# Patient Record
Sex: Male | Born: 1945
Health system: Southern US, Community
[De-identification: ages and names within clinical notes are randomized; demographics above are authoritative.]

## PROBLEM LIST (undated history)

## (undated) ENCOUNTER — Ambulatory Visit

## (undated) DIAGNOSIS — M1990 Unspecified osteoarthritis, unspecified site: Secondary | ICD-10-CM

## (undated) DIAGNOSIS — I10 Essential (primary) hypertension: Secondary | ICD-10-CM

## (undated) DIAGNOSIS — K219 Gastro-esophageal reflux disease without esophagitis: Secondary | ICD-10-CM

## (undated) DIAGNOSIS — C189 Malignant neoplasm of colon, unspecified: Secondary | ICD-10-CM

## (undated) DIAGNOSIS — E785 Hyperlipidemia, unspecified: Secondary | ICD-10-CM

## (undated) DIAGNOSIS — N189 Chronic kidney disease, unspecified: Secondary | ICD-10-CM

## (undated) HISTORY — DX: Gastro-esophageal reflux disease without esophagitis: K21.9

## (undated) HISTORY — PX: COLONOSCOPY: SHX5424

## (undated) HISTORY — DX: Hyperlipidemia, unspecified: E78.5

## (undated) HISTORY — DX: Chronic kidney disease, unspecified: N18.9

## (undated) HISTORY — DX: Malignant neoplasm of colon, unspecified: C18.9

## (undated) HISTORY — DX: Essential (primary) hypertension: I10

---

## 2009-12-27 ENCOUNTER — Inpatient Hospital Stay: Payer: Self-pay | Admitting: Internal Medicine

## 2010-02-04 ENCOUNTER — Other Ambulatory Visit: Payer: Self-pay | Admitting: Internal Medicine

## 2010-03-03 HISTORY — PX: CARDIAC SURGERY: SHX584

## 2010-03-21 ENCOUNTER — Other Ambulatory Visit: Payer: Self-pay | Admitting: Internal Medicine

## 2010-06-19 ENCOUNTER — Emergency Department: Payer: Self-pay | Admitting: Emergency Medicine

## 2010-06-27 ENCOUNTER — Ambulatory Visit: Payer: Self-pay | Admitting: Internal Medicine

## 2010-10-28 ENCOUNTER — Other Ambulatory Visit: Payer: Self-pay | Admitting: Internal Medicine

## 2010-11-02 ENCOUNTER — Ambulatory Visit: Payer: Self-pay | Admitting: Internal Medicine

## 2010-12-12 ENCOUNTER — Ambulatory Visit: Payer: Self-pay | Admitting: Internal Medicine

## 2011-02-16 ENCOUNTER — Ambulatory Visit: Payer: Self-pay | Admitting: Internal Medicine

## 2011-07-03 ENCOUNTER — Ambulatory Visit: Payer: Self-pay

## 2011-07-03 LAB — CBC WITH DIFFERENTIAL/PLATELET
Basophil #: 0 10*3/uL (ref 0.0–0.1)
Basophil %: 0.8 %
Eosinophil #: 0 10*3/uL (ref 0.0–0.7)
Eosinophil %: 0.4 %
HCT: 46 % (ref 40.0–52.0)
HGB: 15.1 g/dL (ref 13.0–18.0)
Lymphocyte #: 0.5 10*3/uL — ABNORMAL LOW (ref 1.0–3.6)
Lymphocyte %: 11.6 %
MCH: 29.7 pg (ref 26.0–34.0)
MCHC: 32.8 g/dL (ref 32.0–36.0)
MCV: 91 fL (ref 80–100)
Monocyte #: 0.3 10*3/uL (ref 0.0–0.7)
Monocyte %: 6.7 %
Neutrophil #: 3.5 10*3/uL (ref 1.4–6.5)
Neutrophil %: 80.5 %
Platelet: 222 10*3/uL (ref 150–440)
RBC: 5.08 10*6/uL (ref 4.40–5.90)
RDW: 13.1 % (ref 11.5–14.5)
WBC: 4.3 10*3/uL (ref 3.8–10.6)

## 2011-07-03 LAB — COMPREHENSIVE METABOLIC PANEL
Albumin: 3.6 g/dL (ref 3.4–5.0)
Alkaline Phosphatase: 122 U/L (ref 50–136)
Anion Gap: 9 (ref 7–16)
BUN: 25 mg/dL — ABNORMAL HIGH (ref 7–18)
Bilirubin,Total: 0.3 mg/dL (ref 0.2–1.0)
Calcium, Total: 9.2 mg/dL (ref 8.5–10.1)
Chloride: 102 mmol/L (ref 98–107)
Co2: 25 mmol/L (ref 21–32)
Creatinine: 1.79 mg/dL — ABNORMAL HIGH (ref 0.60–1.30)
EGFR (African American): 49 — ABNORMAL LOW
EGFR (Non-African Amer.): 41 — ABNORMAL LOW
Glucose: 104 mg/dL — ABNORMAL HIGH (ref 65–99)
Osmolality: 277 (ref 275–301)
Potassium: 4.6 mmol/L (ref 3.5–5.1)
SGOT(AST): 30 U/L (ref 15–37)
SGPT (ALT): 38 U/L
Sodium: 136 mmol/L (ref 136–145)
Total Protein: 7.4 g/dL (ref 6.4–8.2)

## 2011-07-03 LAB — LIPASE, BLOOD: Lipase: 214 U/L (ref 73–393)

## 2011-08-29 DIAGNOSIS — R7301 Impaired fasting glucose: Secondary | ICD-10-CM | POA: Insufficient documentation

## 2011-11-24 ENCOUNTER — Other Ambulatory Visit: Payer: Self-pay | Admitting: Internal Medicine

## 2011-11-25 LAB — WBCS, STOOL

## 2011-11-25 LAB — CLOSTRIDIUM DIFFICILE BY PCR

## 2011-12-06 IMAGING — US US EXTREM LOW VENOUS BILAT
1 series · 18 of 24 positions shown · non-contrast
Comparison: none

REASON FOR EXAM: elevated d-dimer, ?DVT
COMMENTS:

PROCEDURE:     US  - US DOPPLER LOW EXTR BILATERAL  - December 28, 2009  [DATE]
RESULT:     History: Elevated d-dimer.

[Series 1: us extrem low venous bilat · 18 of 43 slices shown]
[im 1/43]
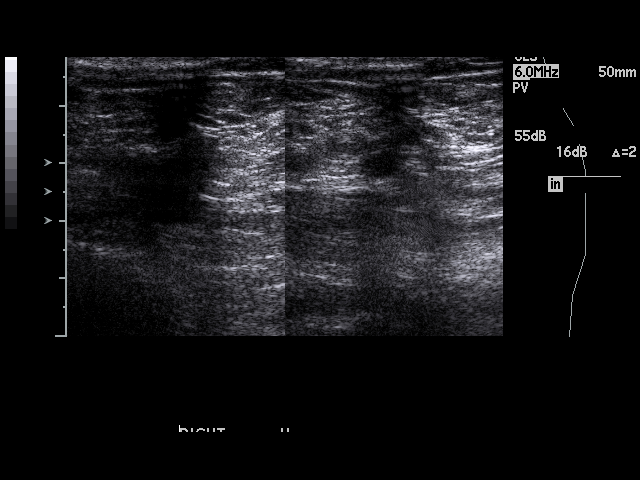
[im 4/43]
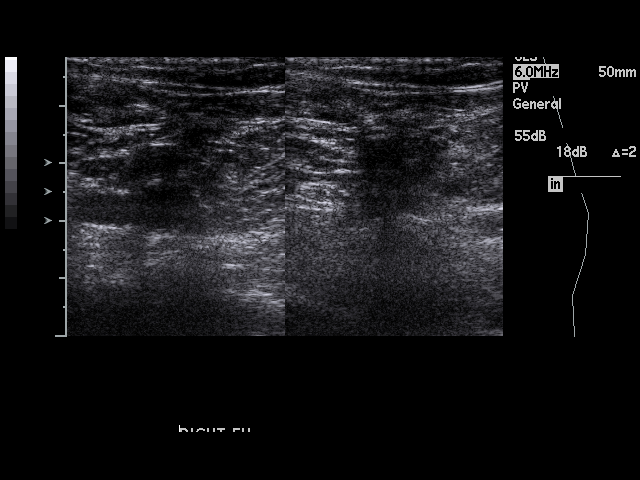
[im 6/43]
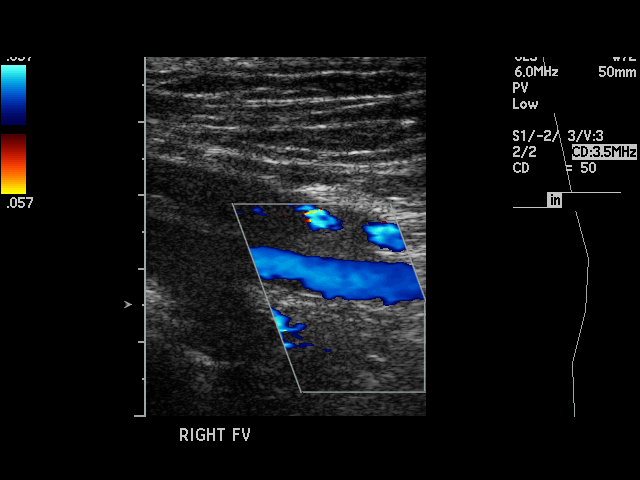
[im 8/43]
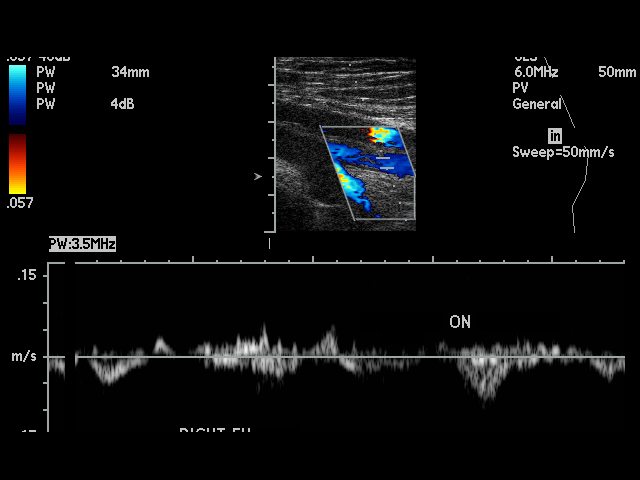
[im 11/43]
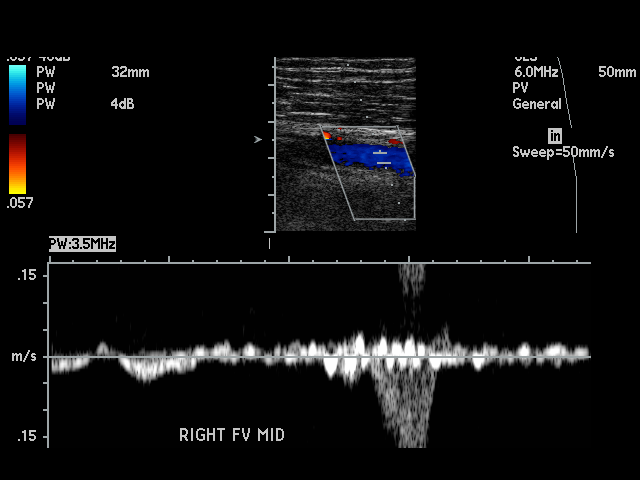
[im 13/43]
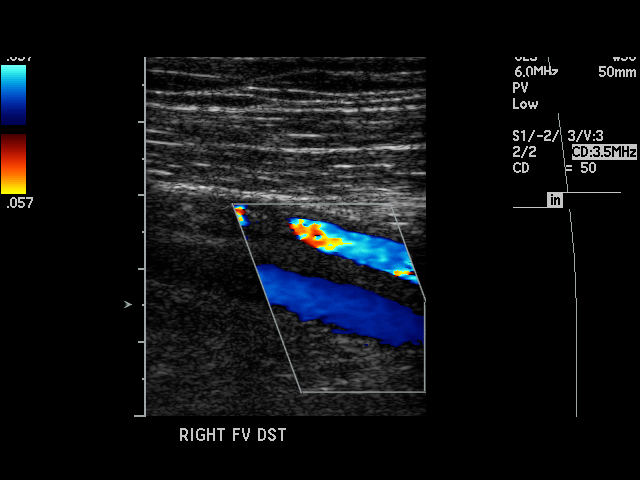
[im 15/43]
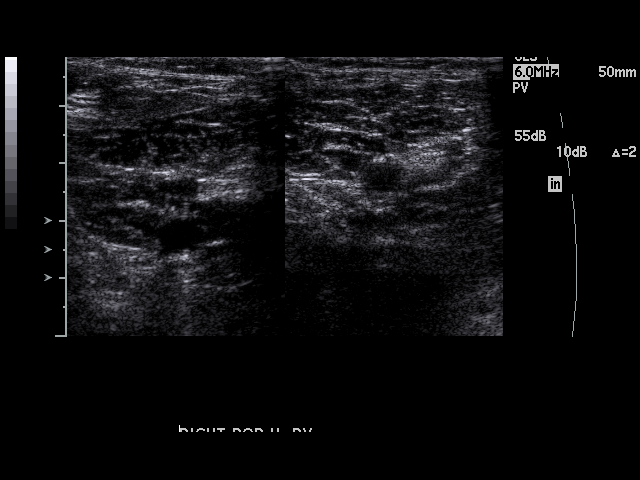
[im 19/43]
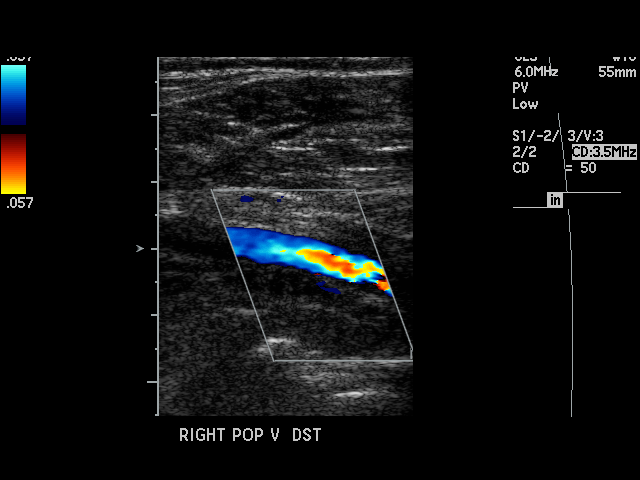
[im 21/43]
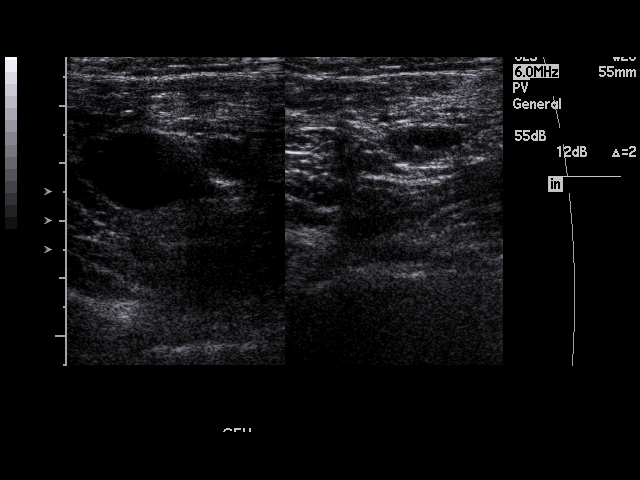
[im 22/43]
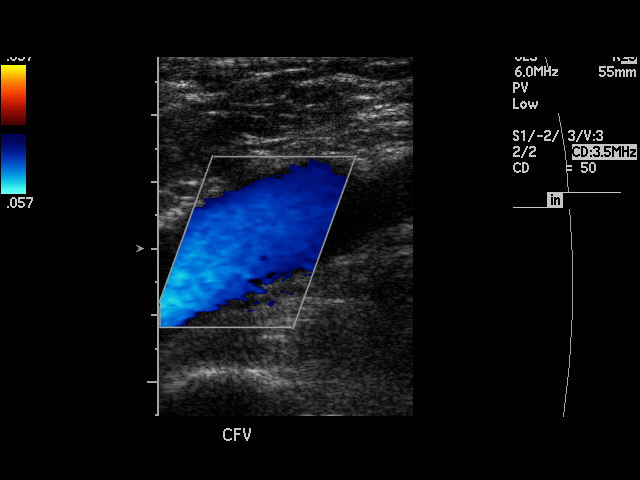
[im 26/43]
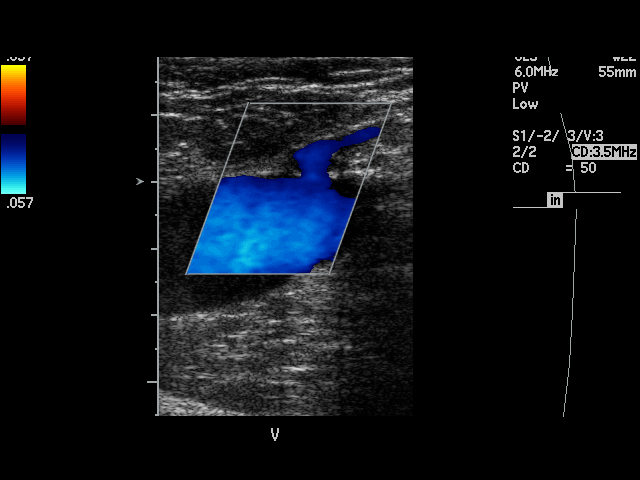
[im 28/43]
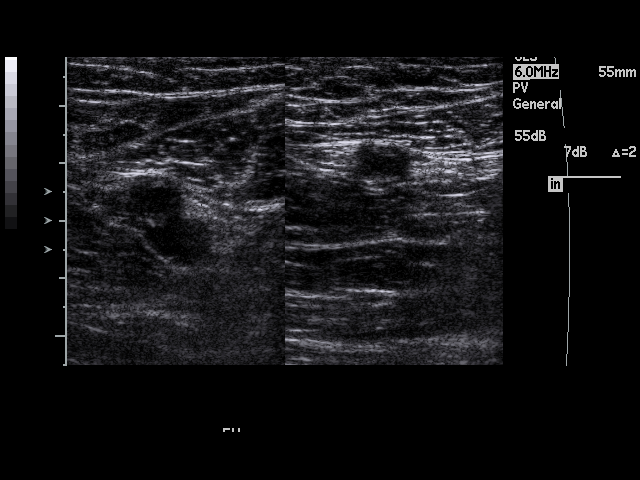
[im 30/43]
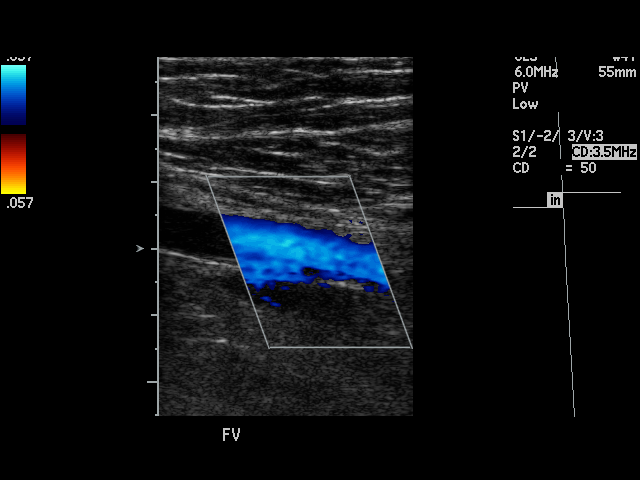
[im 33/43]
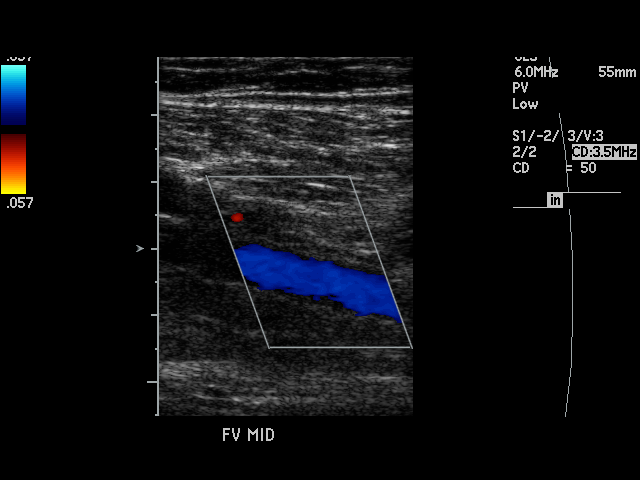
[im 35/43]
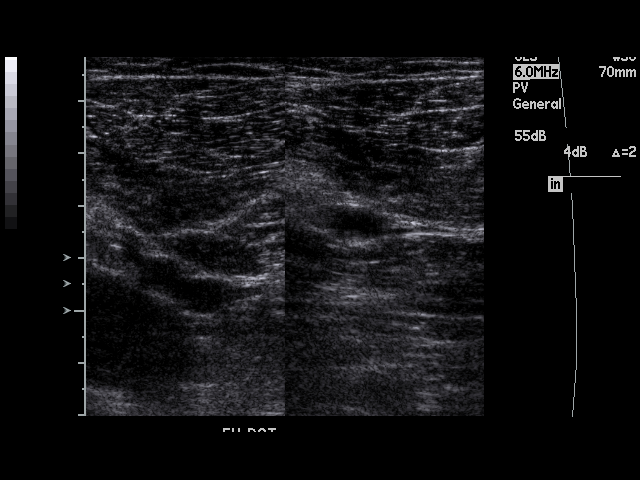
[im 37/43]
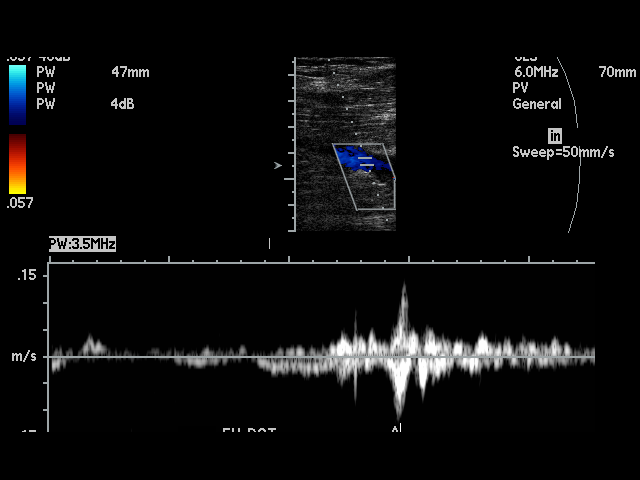
[im 41/43]
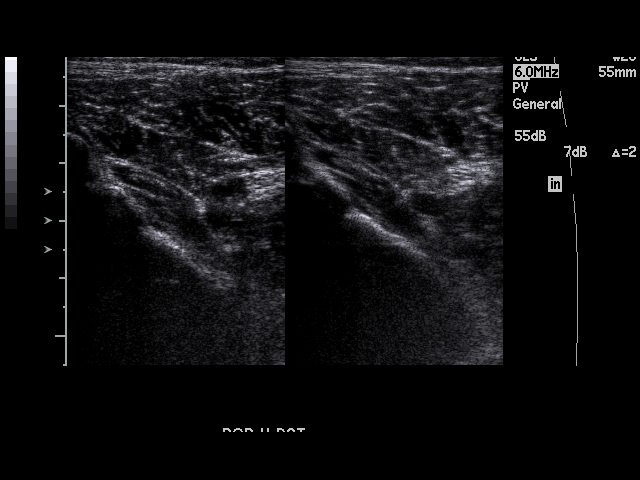
[im 43/43]
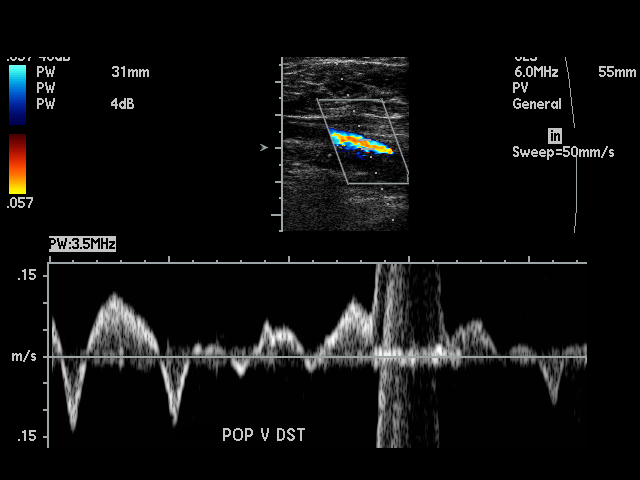

[18 of 24 positions shown; findings below may reference images not displayed]

FINDINGS: Bilateral lower extremity color flow  Duplex Doppler performed. No
deep venous thrombus noted. The right common femoral vein could not be
studied due to central line in this region.
IMPRESSION: Negative exam.

## 2011-12-08 IMAGING — CR DG ABDOMEN 1V
1 series · 2 of 2 positions shown · non-contrast
Comparison: none

REASON FOR EXAM: distended belly
COMMENTS:   Bedside (portable):Y

[Series 1: view not recorded · 0.17mm/px · 2 of 2 slices shown]
[im 1/2]
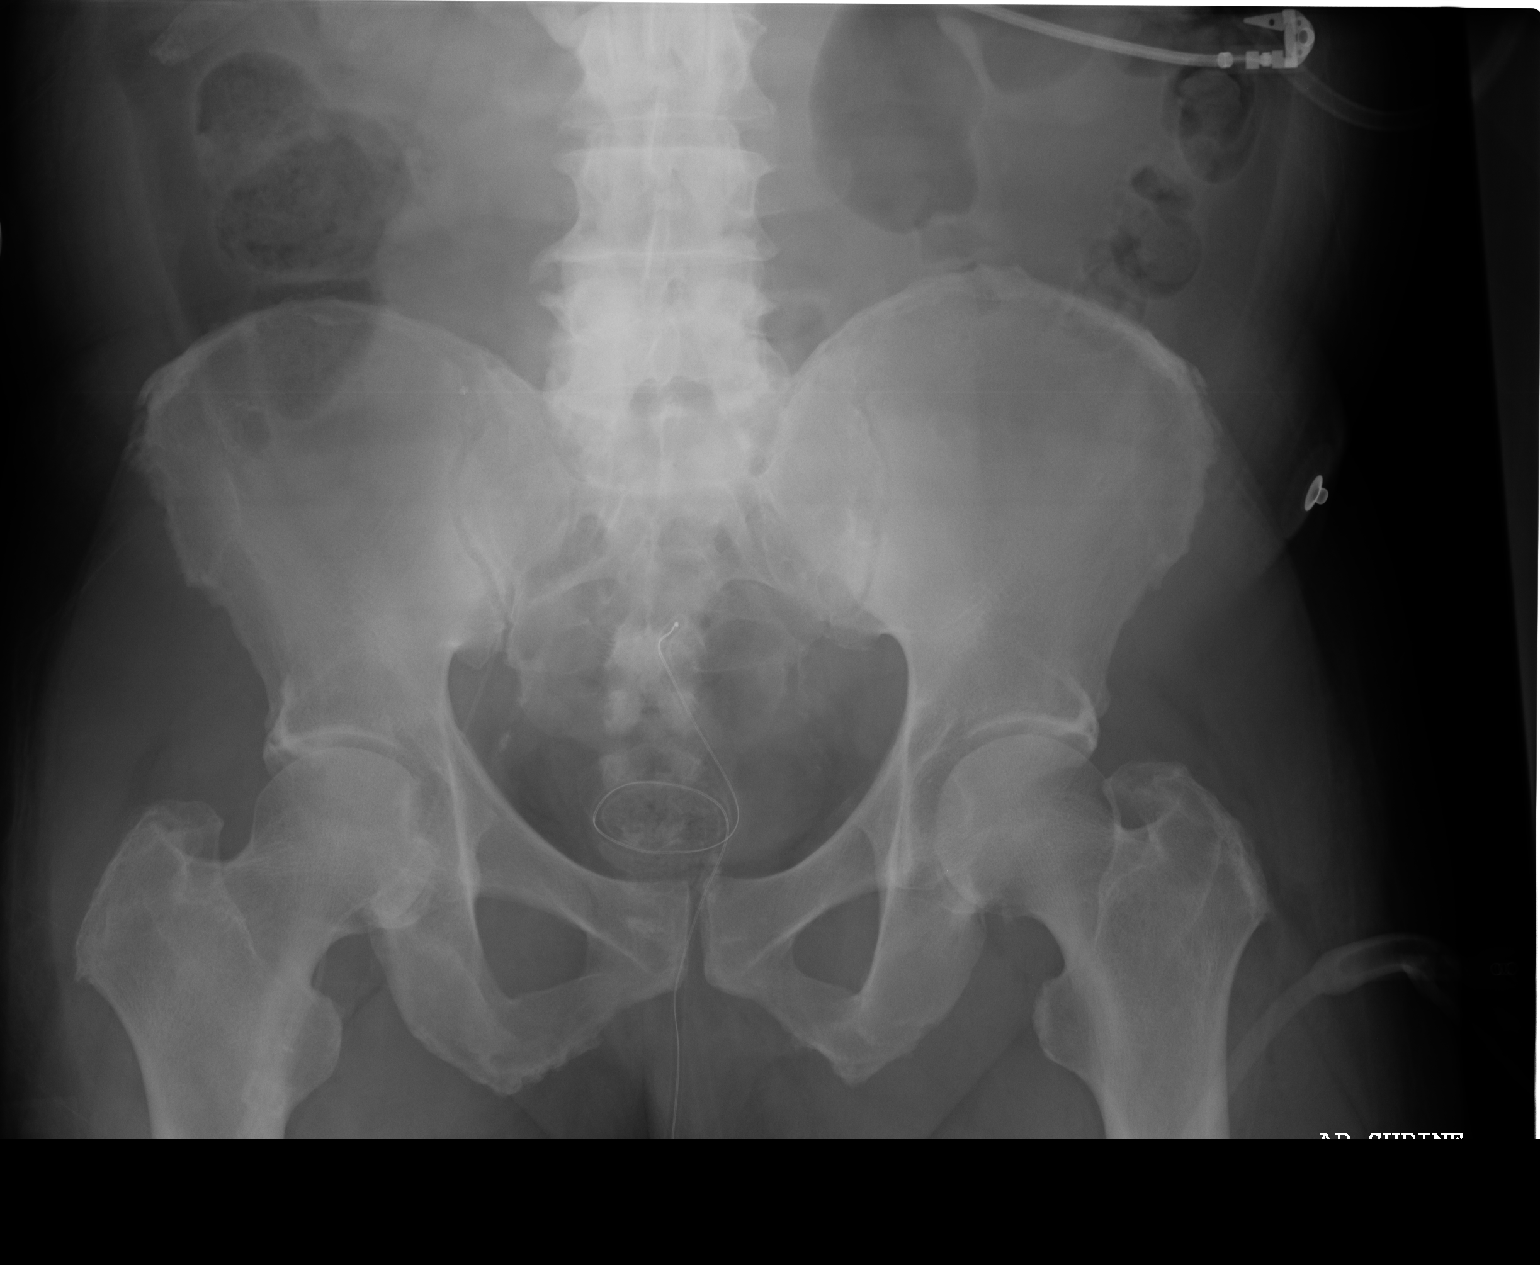
[im 2/2]
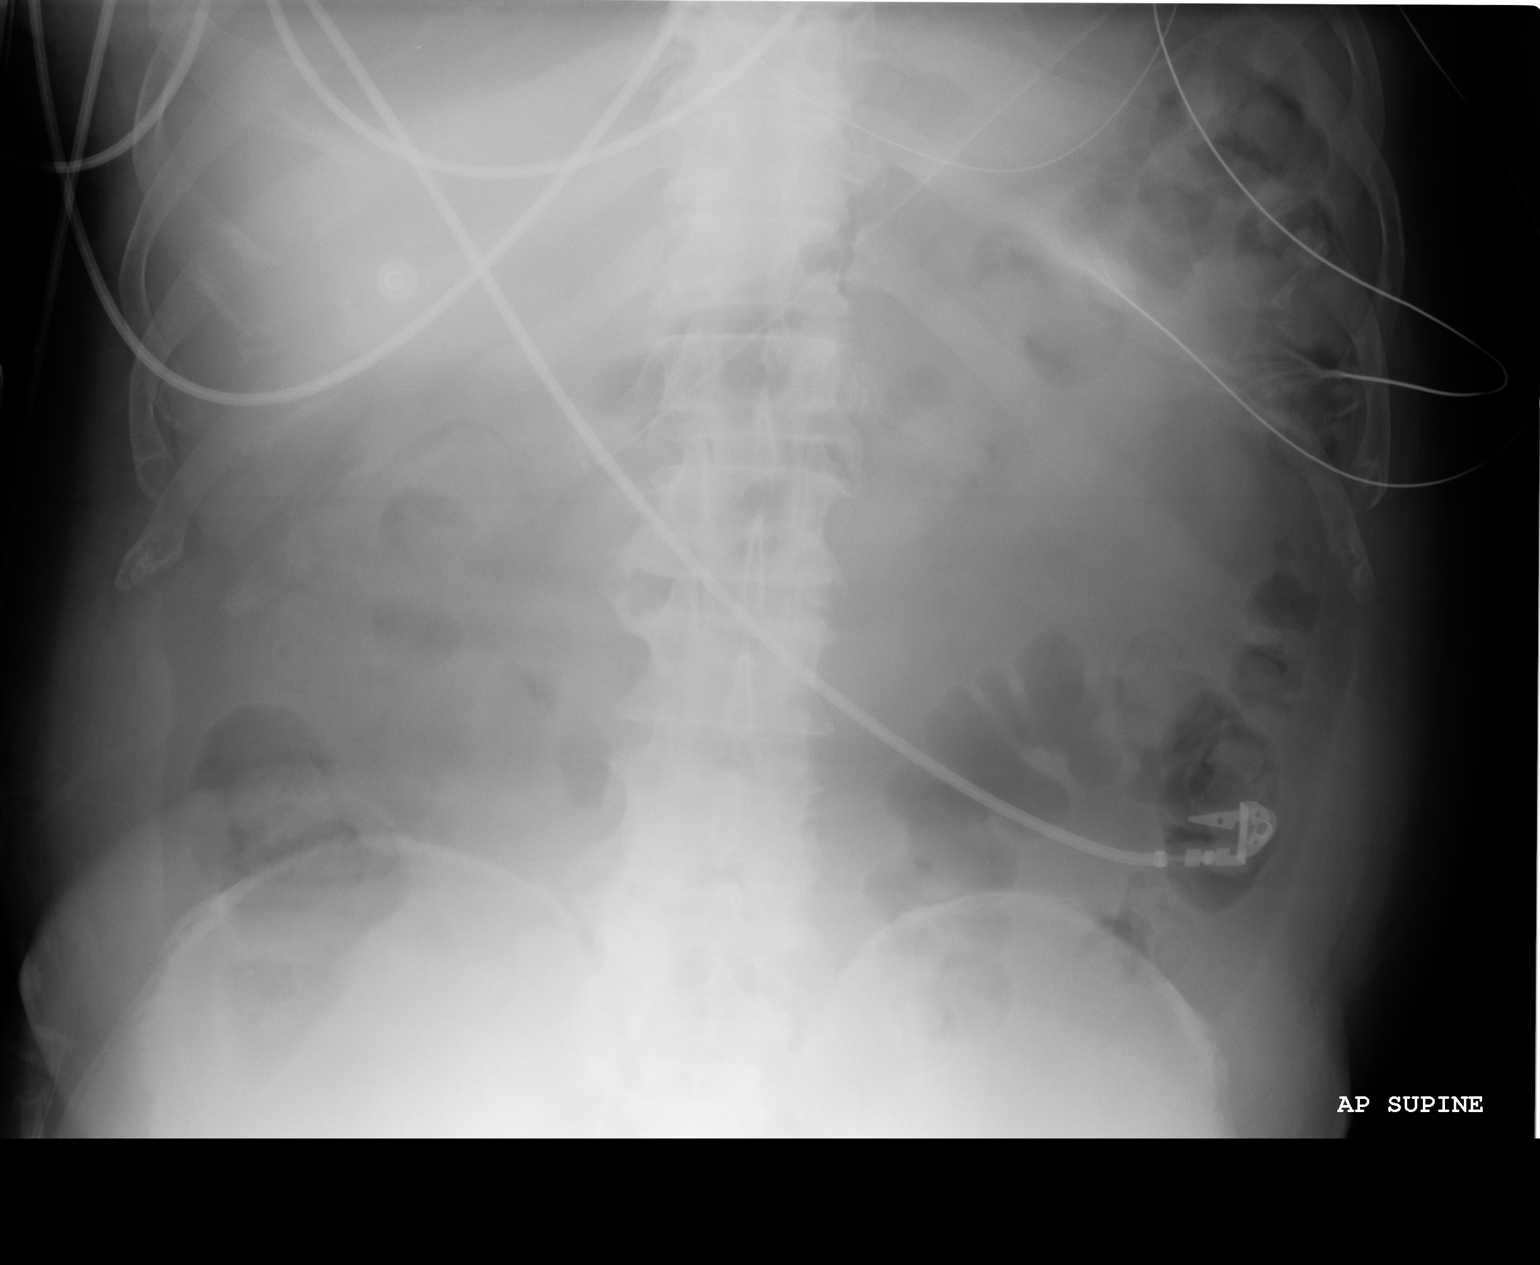

[2 of 2 positions shown; findings below may reference images not displayed]

PROCEDURE:     DXR - DXR KIDNEY URETER BLADDER  - December 30, 2009 [DATE]

RESULT:     Portable AP supine view of the abdomen shows no evidence of
abnormal bowel distention. There appears to be an esophagogastric tube
present with the tip in the region of the pylorus. Cardiac monitoring
electrodes are present. Curvilinear density projects over the region of the
urinary bladder and could represent a Foley catheter. Degenerative changes
are seen in the skeletal system.
IMPRESSION: No abnormal bowel distention evident.

## 2011-12-08 IMAGING — CT CT HEAD WITHOUT CONTRAST
1 series · 16 of 30 positions shown, 20 images · non-contrast
Comparison: none

REASON FOR EXAM: seizure and posturing
COMMENTS:

PROCEDURE:     CT  - CT HEAD WITHOUT CONTRAST  - December 30, 2009  [DATE]
RESULT:     Head CT dated 12/30/2009
TECHNIQUE: Helical 5 mm sections were obtained from the skull base to the
vertex without administration of intravenous contrast. The study was
compared to previous study dated 12/27/2009.

[Series 2: soft tissue · axial · 0.45mm/px · z∈[-169,-34]mm · 16 of 30 slices shown, 20 images]
[im 2/30  brain]
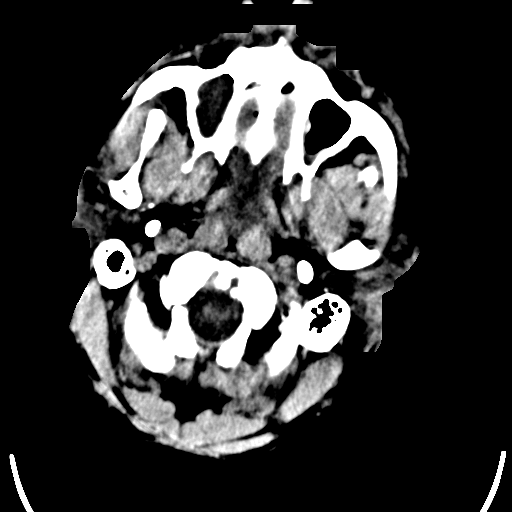
[im 2/30  bone]
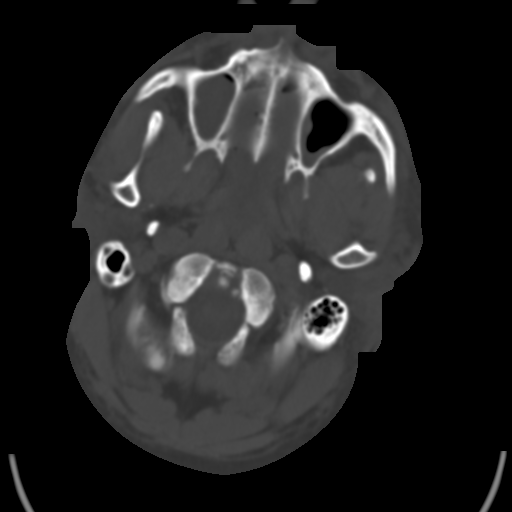
[im 4/30  brain]
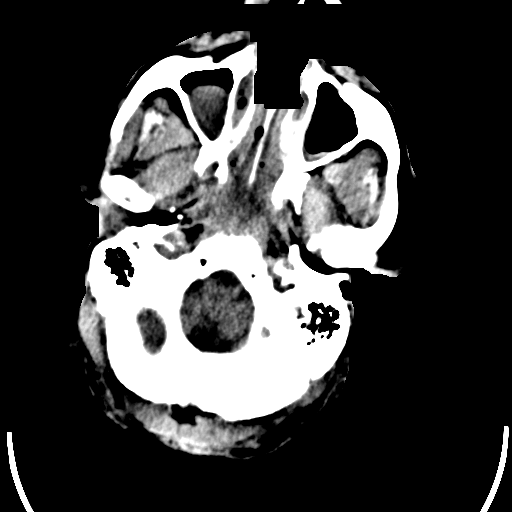
[im 6/30  brain]
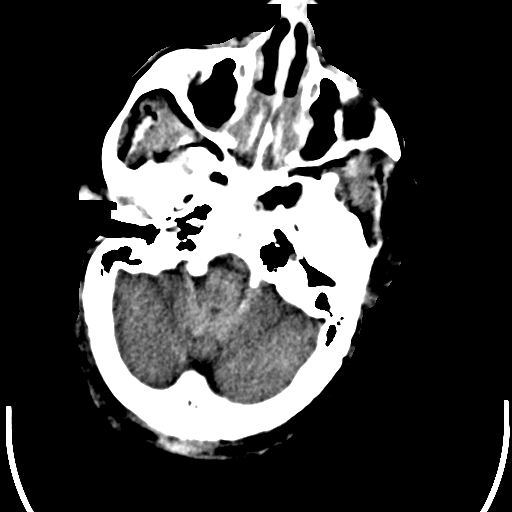
[im 8/30  brain]
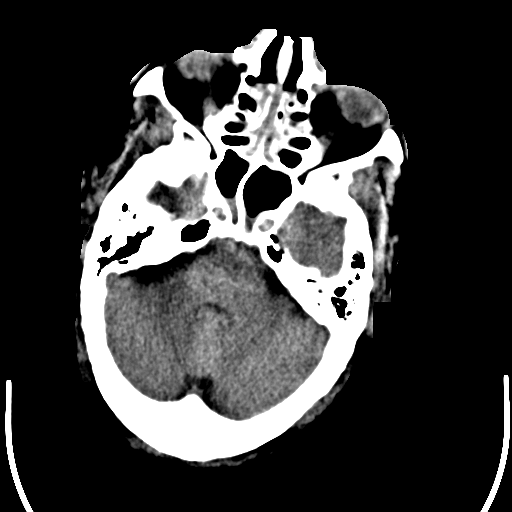
[im 9/30  brain]
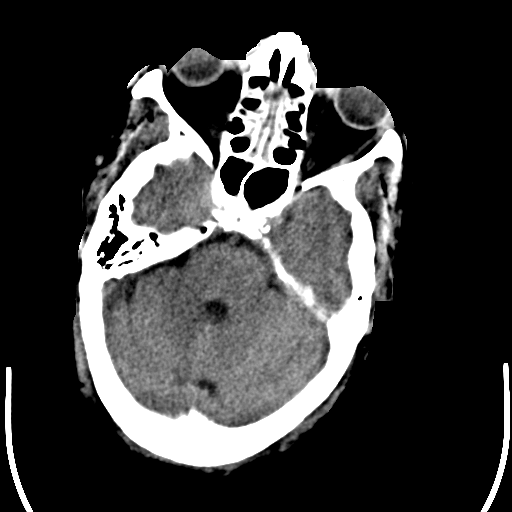
[im 9/30  bone]
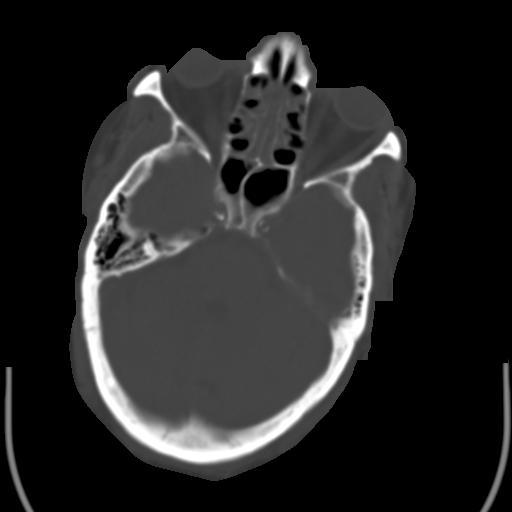
[im 11/30  brain]
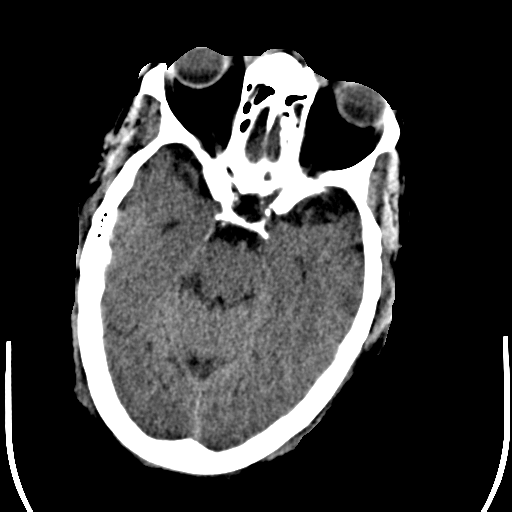
[im 13/30  brain]
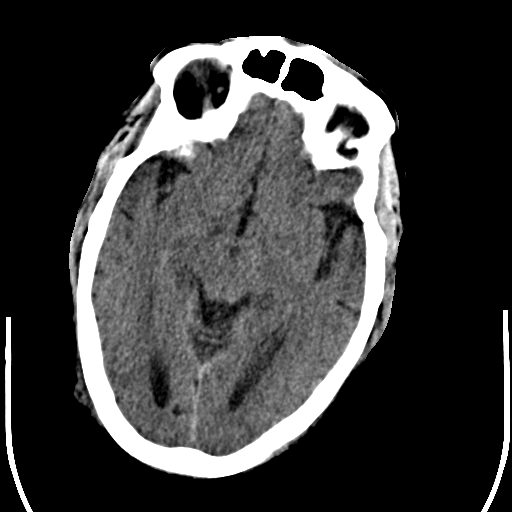
[im 15/30  brain]
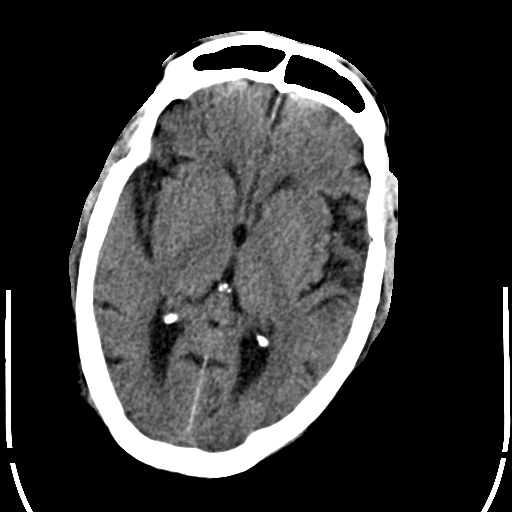
[im 16/30  brain]
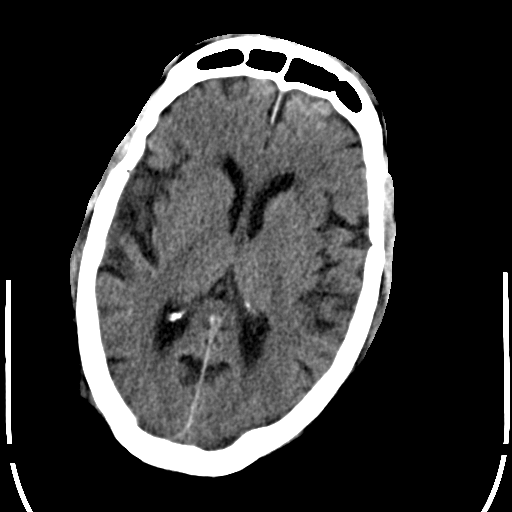
[im 16/30  bone]
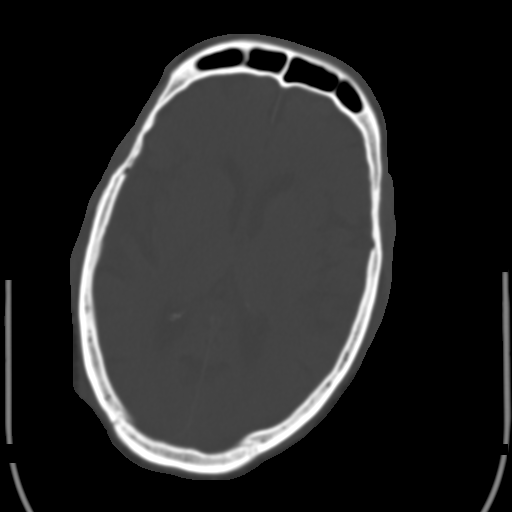
[im 18/30  brain]
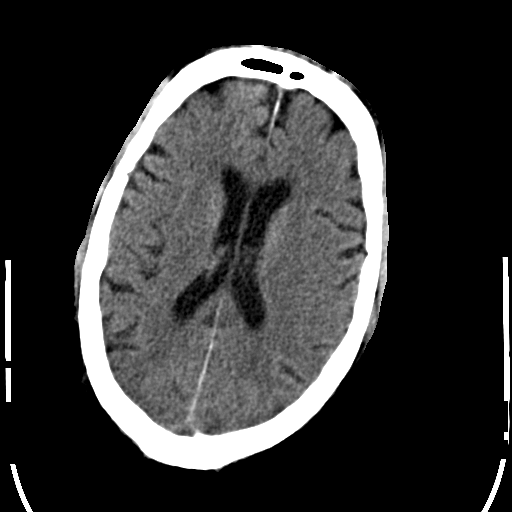
[im 20/30  brain]
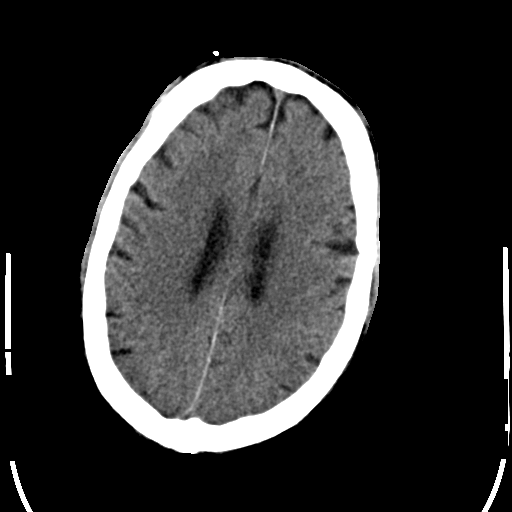
[im 22/30  brain]
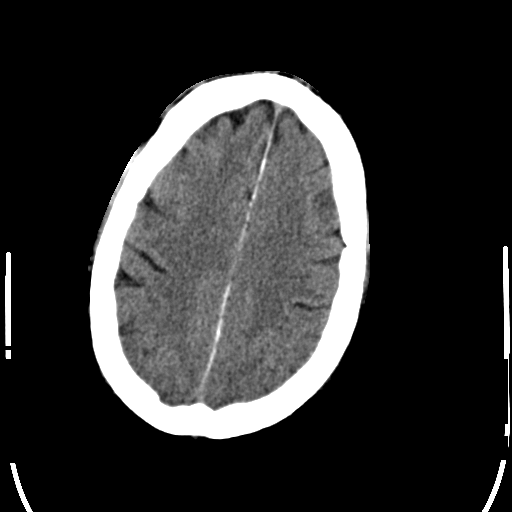
[im 23/30  brain]
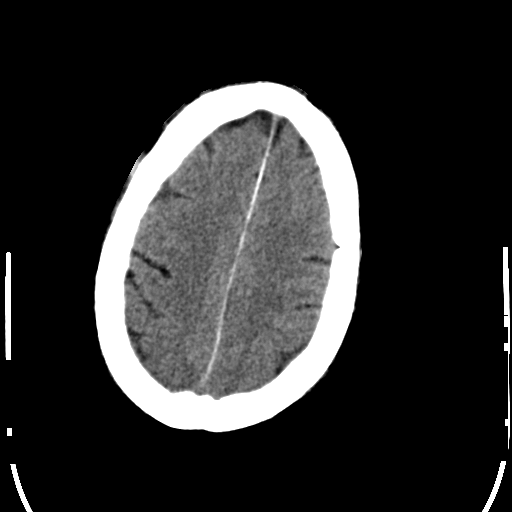
[im 23/30  bone]
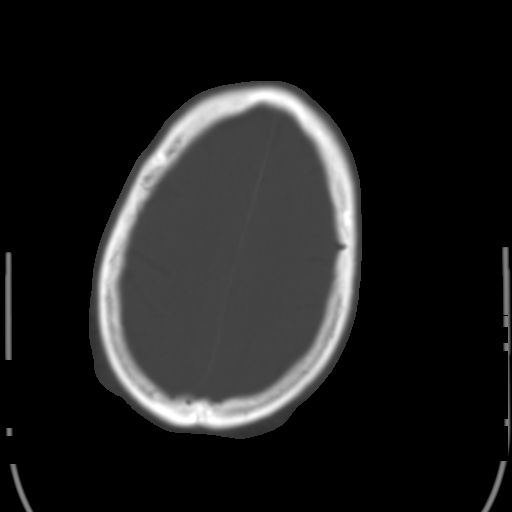
[im 25/30  brain]
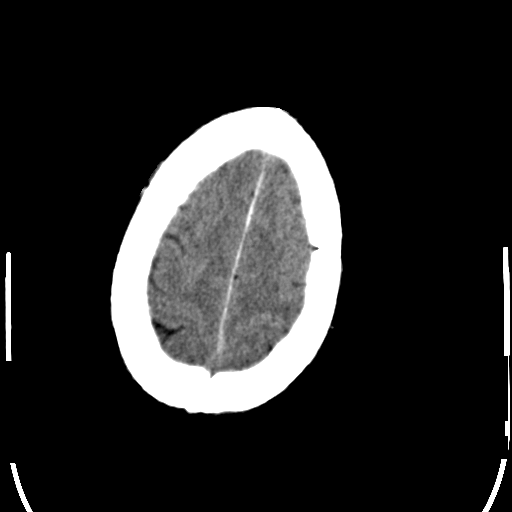
[im 27/30  brain]
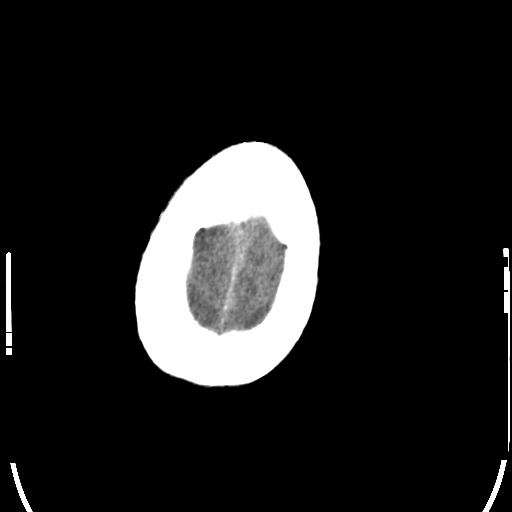
[im 29/30  brain]
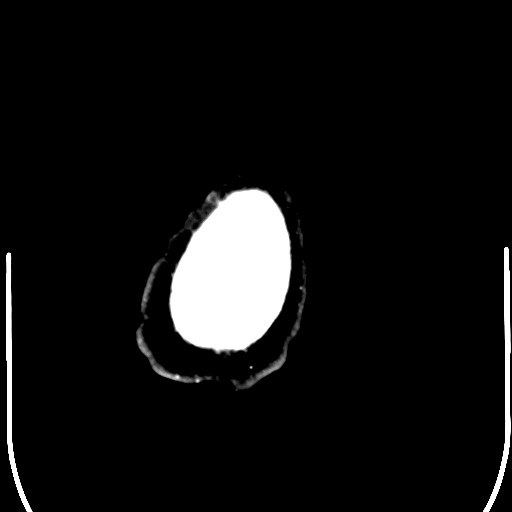

[16 of 30 positions shown; findings below may reference images not displayed]

FINDINGS: There is no evidence of intra-axial nor extra-axial fluid
collections. Is no evidence of acute hemorrhage. Mild diffuse cortical
atrophy is appreciated. There is no evidence of subfalcine or tonsillar
herniation. The ventricles and cisterns are patent. There is evidence of
mild effacement of the sulci along the apices of the right and left frontal
lobes. This finding is symmetric and unchanged and is likely an incidental
finding. The pons and cerebellum are grossly unremarkable.
IMPRESSION: No evidence of focal or acute abnormalities. There does
appear to be no significant change in head CT with compared to prior study.

## 2011-12-21 DIAGNOSIS — Z86010 Personal history of colonic polyps: Secondary | ICD-10-CM | POA: Insufficient documentation

## 2012-03-28 ENCOUNTER — Ambulatory Visit: Payer: Self-pay | Admitting: Internal Medicine

## 2012-03-28 LAB — COMPREHENSIVE METABOLIC PANEL
Anion Gap: 7 (ref 7–16)
Calcium, Total: 9.3 mg/dL (ref 8.5–10.1)
Chloride: 107 mmol/L (ref 98–107)
Co2: 30 mmol/L (ref 21–32)
EGFR (African American): 55 — ABNORMAL LOW
Osmolality: 292 (ref 275–301)
SGOT(AST): 34 U/L (ref 15–37)
SGPT (ALT): 48 U/L (ref 12–78)
Total Protein: 7.5 g/dL (ref 6.4–8.2)

## 2012-03-28 LAB — CBC WITH DIFFERENTIAL/PLATELET
Basophil #: 0.1 10*3/uL (ref 0.0–0.1)
Basophil %: 1.4 %
Eosinophil #: 0.2 10*3/uL (ref 0.0–0.7)
HCT: 44.3 % (ref 40.0–52.0)
Lymphocyte #: 2.5 10*3/uL (ref 1.0–3.6)
Lymphocyte %: 45.3 %
MCH: 29.9 pg (ref 26.0–34.0)
MCHC: 32.9 g/dL (ref 32.0–36.0)
MCV: 91 fL (ref 80–100)
Monocyte %: 8.7 %
Neutrophil #: 2.2 10*3/uL (ref 1.4–6.5)
Neutrophil %: 40.8 %
RDW: 13.6 % (ref 11.5–14.5)

## 2012-03-28 LAB — LIPID PANEL
HDL Cholesterol: 42 mg/dL (ref 40–60)
Ldl Cholesterol, Calc: 80 mg/dL (ref 0–100)
Triglycerides: 96 mg/dL (ref 0–200)
VLDL Cholesterol, Calc: 19 mg/dL (ref 5–40)

## 2012-03-29 ENCOUNTER — Ambulatory Visit: Payer: Self-pay | Admitting: Internal Medicine

## 2012-03-29 LAB — BASIC METABOLIC PANEL
Anion Gap: 6 — ABNORMAL LOW (ref 7–16)
BUN: 25 mg/dL — ABNORMAL HIGH (ref 7–18)
Chloride: 106 mmol/L (ref 98–107)
Co2: 29 mmol/L (ref 21–32)
Creatinine: 1.56 mg/dL — ABNORMAL HIGH (ref 0.60–1.30)
EGFR (Non-African Amer.): 46 — ABNORMAL LOW
Glucose: 99 mg/dL (ref 65–99)
Osmolality: 286 (ref 275–301)
Potassium: 4.1 mmol/L (ref 3.5–5.1)

## 2012-03-29 LAB — PSA: PSA: 0.6 ng/mL (ref 0.0–4.0)

## 2012-04-10 ENCOUNTER — Ambulatory Visit: Payer: Self-pay | Admitting: Internal Medicine

## 2012-09-03 ENCOUNTER — Ambulatory Visit: Payer: Self-pay | Admitting: Internal Medicine

## 2012-09-06 ENCOUNTER — Ambulatory Visit: Payer: Self-pay | Admitting: Internal Medicine

## 2012-09-10 ENCOUNTER — Other Ambulatory Visit: Payer: Self-pay | Admitting: Internal Medicine

## 2012-09-10 LAB — CBC WITH DIFFERENTIAL/PLATELET
Basophil #: 0.1 10*3/uL (ref 0.0–0.1)
Eosinophil %: 1 %
HGB: 15.2 g/dL (ref 13.0–18.0)
Lymphocyte #: 1.6 10*3/uL (ref 1.0–3.6)
MCH: 30.7 pg (ref 26.0–34.0)
MCHC: 33.9 g/dL (ref 32.0–36.0)
Monocyte %: 5.3 %
Neutrophil #: 4.6 10*3/uL (ref 1.4–6.5)
RBC: 4.94 10*6/uL (ref 4.40–5.90)
RDW: 13.4 % (ref 11.5–14.5)
WBC: 6.7 10*3/uL (ref 3.8–10.6)

## 2012-09-10 LAB — BASIC METABOLIC PANEL
Chloride: 114 mmol/L — ABNORMAL HIGH (ref 98–107)
Glucose: 101 mg/dL — ABNORMAL HIGH (ref 65–99)
Osmolality: 289 (ref 275–301)
Potassium: 3.9 mmol/L (ref 3.5–5.1)
Sodium: 144 mmol/L (ref 136–145)

## 2012-09-10 LAB — SEDIMENTATION RATE: Erythrocyte Sed Rate: 2 mm/hr (ref 0–20)

## 2012-11-27 DIAGNOSIS — M4802 Spinal stenosis, cervical region: Secondary | ICD-10-CM | POA: Insufficient documentation

## 2012-12-11 HISTORY — PX: CERVICAL FUSION: SHX112

## 2013-01-03 ENCOUNTER — Ambulatory Visit: Payer: Self-pay | Admitting: Family Medicine

## 2013-01-03 LAB — BASIC METABOLIC PANEL
BUN: 19 mg/dL — ABNORMAL HIGH (ref 7–18)
Calcium, Total: 9.1 mg/dL (ref 8.5–10.1)
Co2: 26 mmol/L (ref 21–32)
EGFR (Non-African Amer.): 51 — ABNORMAL LOW
Glucose: 100 mg/dL — ABNORMAL HIGH (ref 65–99)
Osmolality: 280 (ref 275–301)
Potassium: 4 mmol/L (ref 3.5–5.1)
Sodium: 139 mmol/L (ref 136–145)

## 2013-01-03 LAB — CBC WITH DIFFERENTIAL/PLATELET
Basophil #: 0.1 10*3/uL (ref 0.0–0.1)
Eosinophil #: 0.2 10*3/uL (ref 0.0–0.7)
Eosinophil %: 3.3 %
HCT: 44.1 % (ref 40.0–52.0)
HGB: 14.4 g/dL (ref 13.0–18.0)
Lymphocyte #: 2 10*3/uL (ref 1.0–3.6)
Lymphocyte %: 40.8 %
MCH: 29.4 pg (ref 26.0–34.0)
MCV: 90 fL (ref 80–100)
RBC: 4.9 10*6/uL (ref 4.40–5.90)
RDW: 13.2 % (ref 11.5–14.5)

## 2013-02-20 ENCOUNTER — Ambulatory Visit: Payer: Self-pay | Admitting: Gastroenterology

## 2014-01-12 ENCOUNTER — Ambulatory Visit: Payer: Self-pay

## 2014-12-11 ENCOUNTER — Other Ambulatory Visit: Payer: Self-pay | Admitting: Family Medicine

## 2014-12-11 DIAGNOSIS — E785 Hyperlipidemia, unspecified: Secondary | ICD-10-CM

## 2014-12-11 DIAGNOSIS — K219 Gastro-esophageal reflux disease without esophagitis: Secondary | ICD-10-CM

## 2014-12-11 DIAGNOSIS — I10 Essential (primary) hypertension: Secondary | ICD-10-CM

## 2014-12-16 ENCOUNTER — Encounter: Payer: Self-pay | Admitting: Family Medicine

## 2014-12-16 ENCOUNTER — Ambulatory Visit (INDEPENDENT_AMBULATORY_CARE_PROVIDER_SITE_OTHER): Payer: Commercial Managed Care - HMO | Admitting: Family Medicine

## 2014-12-16 VITALS — BP 120/60 | HR 64 | Ht 66.0 in | Wt 185.0 lb

## 2014-12-16 DIAGNOSIS — E785 Hyperlipidemia, unspecified: Secondary | ICD-10-CM

## 2014-12-16 DIAGNOSIS — Z1211 Encounter for screening for malignant neoplasm of colon: Secondary | ICD-10-CM

## 2014-12-16 DIAGNOSIS — Z23 Encounter for immunization: Secondary | ICD-10-CM | POA: Diagnosis not present

## 2014-12-16 DIAGNOSIS — I10 Essential (primary) hypertension: Secondary | ICD-10-CM

## 2014-12-16 DIAGNOSIS — K219 Gastro-esophageal reflux disease without esophagitis: Secondary | ICD-10-CM | POA: Diagnosis not present

## 2014-12-16 LAB — HEMOCCULT GUIAC POC 1CARD (OFFICE): FECAL OCCULT BLD: NEGATIVE

## 2014-12-16 MED ORDER — METOPROLOL TARTRATE 25 MG PO TABS: 12.5000 mg | ORAL_TABLET | Freq: Two times a day (BID) | ORAL | Status: DC

## 2014-12-16 MED ORDER — PANTOPRAZOLE SODIUM 40 MG PO TBEC: 40.0000 mg | DELAYED_RELEASE_TABLET | Freq: Every day | ORAL | Status: DC

## 2014-12-16 MED ORDER — LISINOPRIL 5 MG PO TABS: 5.0000 mg | ORAL_TABLET | Freq: Every day | ORAL | Status: DC

## 2014-12-16 MED ORDER — ATORVASTATIN CALCIUM 80 MG PO TABS: 80.0000 mg | ORAL_TABLET | Freq: Every day | ORAL | Status: DC

## 2014-12-16 NOTE — Progress Notes (Signed)
Name: Matthew Humphrey   MRN: [id number]    DOB: 15-Nov-1945   Date:12/16/2014       Progress Note  Subjective  Chief Complaint  Chief Complaint  Patient presents with  . Hypertension  . Hyperlipidemia  . Gastrophageal Reflux    Hypertension This is a chronic problem. The current episode started more than 1 year ago. The problem has been gradually improving since onset. The problem is controlled. Pertinent negatives include no anxiety, blurred vision, chest pain, headaches, malaise/fatigue, neck pain, orthopnea, palpitations, peripheral edema, PND, shortness of breath or sweats. There are no associated agents to hypertension. Risk factors for coronary artery disease include dyslipidemia and male gender. Past treatments include ACE inhibitors and beta blockers. The current treatment provides moderate improvement. There are no compliance problems.  There is no history of angina, kidney disease, CAD/MI, CVA, heart failure, PVD or renovascular disease. There is no history of chronic renal disease.  Hyperlipidemia This is a chronic problem. The problem is controlled. Recent lipid tests were reviewed and are normal. He has no history of chronic renal disease, diabetes, hypothyroidism, liver disease, obesity or nephrotic syndrome. Factors aggravating his hyperlipidemia include beta blockers. Pertinent negatives include no chest pain, focal sensory loss, focal weakness, leg pain, myalgias or shortness of breath. Current antihyperlipidemic treatment includes statins. The current treatment provides mild improvement of lipids. There are no compliance problems.   Gastrophageal Reflux He reports no abdominal pain, no belching, no chest pain, no choking, no coughing, no dysphagia, no early satiety, no globus sensation, no heartburn, no hoarse voice, no nausea, no sore throat, no stridor or no wheezing. This is a chronic problem. The current episode started more than 1 year ago. The problem occurs rarely. Nothing  aggravates the symptoms. Pertinent negatives include no anemia, fatigue, melena, muscle weakness, orthopnea or weight loss. There are no known risk factors. He has tried a PPI for the symptoms. The treatment provided moderate relief.    No problem-specific assessment & plan notes found for this encounter.   Past Medical History  Diagnosis Date  . Hypertension   . Hyperlipidemia   . GERD (gastroesophageal reflux disease)     Past Surgical History  Procedure Laterality Date  . Cardiac surgery      bypass  . Cervical fusion    . Colonoscopy  2011 ?    Family History  Problem Relation Age of Onset  . Hypertension Father     Social History   Social History  . Marital Status: Single    Spouse Name: N/A  . Number of Children: N/A  . Years of Education: N/A   Occupational History  . Not on file.   Social History Main Topics  . Smoking status: Former Research scientist (life sciences)  . Smokeless tobacco: Not on file  . Alcohol Use: No  . Drug Use: No  . Sexual Activity: No   Other Topics Concern  . Not on file   Social History Narrative  . No narrative on file    Allergies  Allergen Reactions  . Sulfa Antibiotics Other (See Comments)     Review of Systems  Constitutional: Negative for fever, chills, weight loss, malaise/fatigue and fatigue.  HENT: Negative for ear discharge, ear pain, hoarse voice and sore throat.   Eyes: Negative for blurred vision.  Respiratory: Negative for cough, sputum production, choking, shortness of breath and wheezing.   Cardiovascular: Negative for chest pain, palpitations, orthopnea, leg swelling and PND.  Gastrointestinal: Negative for heartburn, dysphagia, nausea,  abdominal pain, diarrhea, constipation, blood in stool and melena.  Genitourinary: Negative for dysuria, urgency, frequency and hematuria.  Musculoskeletal: Negative for myalgias, back pain, joint pain, muscle weakness and neck pain.  Skin: Negative for rash.  Neurological: Negative for  dizziness, tingling, sensory change, focal weakness and headaches.  Endo/Heme/Allergies: Negative for environmental allergies and polydipsia. Does not bruise/bleed easily.  Psychiatric/Behavioral: Negative for depression and suicidal ideas. The patient is not nervous/anxious and does not have insomnia.      Objective  Filed Vitals:   12/16/14 0956  BP: 120/60  Pulse: 64  Height: 5\' 6"  (1.676 m)  Weight: 185 lb (83.915 kg)    Physical Exam  Constitutional: He is oriented to person, place, and time and well-developed, well-nourished, and in no distress.  HENT:  Head: Normocephalic.  Right Ear: External ear normal.  Left Ear: External ear normal.  Nose: Nose normal.  Mouth/Throat: Oropharynx is clear and moist.  Eyes: Conjunctivae and EOM are normal. Pupils are equal, round, and reactive to light. Right eye exhibits no discharge. Left eye exhibits no discharge. No scleral icterus.  Neck: Normal range of motion. Neck supple. No JVD present. No tracheal deviation present. No thyromegaly present.  Cardiovascular: Normal rate, regular rhythm, normal heart sounds and intact distal pulses.  Exam reveals no gallop and no friction rub.   No murmur heard. Pulmonary/Chest: Breath sounds normal. No respiratory distress. He has no wheezes. He has no rales.  Abdominal: Soft. Bowel sounds are normal. He exhibits no mass. There is no hepatosplenomegaly. There is no tenderness. There is no rebound, no guarding and no CVA tenderness.  Genitourinary: Rectum normal and prostate normal.  Musculoskeletal: Normal range of motion. He exhibits no edema or tenderness.  Lymphadenopathy:    He has no cervical adenopathy.  Neurological: He is alert and oriented to person, place, and time. He has normal sensation, normal strength, normal reflexes and intact cranial nerves. No cranial nerve deficit.  Skin: Skin is warm. No rash noted.  Psychiatric: Mood and affect normal.      Assessment & Plan  Problem  List Items Addressed This Visit      Cardiovascular and Mediastinum   Essential hypertension - Primary   Relevant Medications   atorvastatin (LIPITOR) 80 MG tablet   lisinopril (PRINIVIL,ZESTRIL) 5 MG tablet   metoprolol tartrate (LOPRESSOR) 25 MG tablet   Other Relevant Orders   Renal Function Panel     Digestive   Esophageal reflux   Relevant Medications   pantoprazole (PROTONIX) 40 MG tablet     Other   Hyperlipidemia   Relevant Medications   atorvastatin (LIPITOR) 80 MG tablet   lisinopril (PRINIVIL,ZESTRIL) 5 MG tablet   metoprolol tartrate (LOPRESSOR) 25 MG tablet   Other Relevant Orders   Lipid Profile    Other Visit Diagnoses    Colon cancer screening        Relevant Orders    POCT Occult Blood Stool         Dr. Boone Gear [facility]  12/16/2014

## 2014-12-17 LAB — RENAL FUNCTION PANEL
ALBUMIN: 4.3 g/dL (ref 3.6–4.8)
BUN / CREAT RATIO: 12 (ref 10–22)
BUN: 17 mg/dL (ref 8–27)
CO2: 25 mmol/L (ref 18–29)
Calcium: 9.7 mg/dL (ref 8.6–10.2)
Chloride: 102 mmol/L (ref 97–108)
Creatinine, Ser: 1.45 mg/dL — ABNORMAL HIGH (ref 0.76–1.27)
GFR, EST AFRICAN AMERICAN: 57 mL/min/{1.73_m2} — AB (ref >59)
GFR, EST NON AFRICAN AMERICAN: 49 mL/min/{1.73_m2} — AB (ref >59)
GLUCOSE: 90 mg/dL (ref 65–99)
POTASSIUM: 5 mmol/L (ref 3.5–5.2)
Phosphorus: 2.9 mg/dL (ref 2.5–4.5)
SODIUM: 142 mmol/L (ref 134–144)

## 2014-12-17 LAB — LIPID PANEL
CHOL/HDL RATIO: 2.7 ratio (ref 0.0–5.0)
Cholesterol, Total: 128 mg/dL (ref 100–199)
HDL: 47 mg/dL (ref >39)
LDL Calculated: 72 mg/dL (ref 0–99)
Triglycerides: 46 mg/dL (ref 0–149)
VLDL Cholesterol Cal: 9 mg/dL (ref 5–40)

## 2015-02-16 ENCOUNTER — Other Ambulatory Visit: Payer: Self-pay

## 2015-02-23 ENCOUNTER — Other Ambulatory Visit: Payer: Self-pay

## 2015-03-24 ENCOUNTER — Other Ambulatory Visit: Payer: Self-pay

## 2015-04-06 ENCOUNTER — Other Ambulatory Visit: Payer: Self-pay | Admitting: Family Medicine

## 2015-06-01 DIAGNOSIS — H524 Presbyopia: Secondary | ICD-10-CM | POA: Diagnosis not present

## 2015-06-16 ENCOUNTER — Other Ambulatory Visit: Payer: Self-pay

## 2015-06-16 MED ORDER — LISINOPRIL 5 MG PO TABS: ORAL_TABLET | ORAL | Status: DC

## 2015-06-16 MED ORDER — ATORVASTATIN CALCIUM 80 MG PO TABS: ORAL_TABLET | ORAL | Status: DC

## 2015-06-16 MED ORDER — METOPROLOL TARTRATE 25 MG PO TABS: ORAL_TABLET | ORAL | Status: DC

## 2015-06-16 MED ORDER — PANTOPRAZOLE SODIUM 40 MG PO TBEC: DELAYED_RELEASE_TABLET | ORAL | Status: DC

## 2015-06-16 NOTE — Telephone Encounter (Signed)
Received fax from pharmacy.

## 2015-09-15 ENCOUNTER — Other Ambulatory Visit: Payer: Self-pay | Admitting: Family Medicine

## 2015-11-26 ENCOUNTER — Other Ambulatory Visit: Payer: Self-pay | Admitting: Pharmacist

## 2015-11-26 NOTE — Patient Outreach (Signed)
Outreach call to [employer] regarding his request for follow up from the [facility] Medication Adherence [facility]. Left a HIPAA compliant message on the patient's voicemail.   Harlow Asa, PharmD Clinical Pharmacist [facility] [phone]

## 2015-12-02 ENCOUNTER — Other Ambulatory Visit: Payer: Self-pay

## 2015-12-15 ENCOUNTER — Encounter: Payer: Self-pay | Admitting: Family Medicine

## 2015-12-15 ENCOUNTER — Ambulatory Visit (INDEPENDENT_AMBULATORY_CARE_PROVIDER_SITE_OTHER): Payer: Medicare Other | Admitting: Family Medicine

## 2015-12-15 VITALS — BP 100/66 | HR 72 | Ht 66.0 in | Wt 183.0 lb

## 2015-12-15 DIAGNOSIS — Z23 Encounter for immunization: Secondary | ICD-10-CM | POA: Diagnosis not present

## 2015-12-15 DIAGNOSIS — I10 Essential (primary) hypertension: Secondary | ICD-10-CM

## 2015-12-15 DIAGNOSIS — K219 Gastro-esophageal reflux disease without esophagitis: Secondary | ICD-10-CM

## 2015-12-15 DIAGNOSIS — E785 Hyperlipidemia, unspecified: Secondary | ICD-10-CM

## 2015-12-15 DIAGNOSIS — M5410 Radiculopathy, site unspecified: Secondary | ICD-10-CM

## 2015-12-15 MED ORDER — LISINOPRIL 5 MG PO TABS: 5.0000 mg | ORAL_TABLET | Freq: Every day | ORAL | 1 refills | Status: DC

## 2015-12-15 MED ORDER — PANTOPRAZOLE SODIUM 40 MG PO TBEC: 40.0000 mg | DELAYED_RELEASE_TABLET | Freq: Every day | ORAL | 1 refills | Status: DC

## 2015-12-15 MED ORDER — ATORVASTATIN CALCIUM 80 MG PO TABS: 80.0000 mg | ORAL_TABLET | Freq: Every day | ORAL | 1 refills | Status: DC

## 2015-12-15 MED ORDER — ETODOLAC 500 MG PO TABS: 500.0000 mg | ORAL_TABLET | Freq: Two times a day (BID) | ORAL | 3 refills | Status: DC

## 2015-12-15 MED ORDER — METOPROLOL TARTRATE 25 MG PO TABS: ORAL_TABLET | ORAL | 0 refills | Status: DC

## 2015-12-15 NOTE — Progress Notes (Signed)
Name: Matthew Humphrey   MRN: [id number]    DOB: Jun 14, 1945   Date:12/15/2015       Progress Note  Subjective  Chief Complaint  Chief Complaint  Patient presents with  . Hypertension  . Gastroesophageal Reflux  . Hyperlipidemia    Hypertension  This is a chronic problem. The current episode started more than 1 year ago. The problem has been gradually improving since onset. The problem is controlled. Pertinent negatives include no anxiety, blurred vision, chest pain, headaches, malaise/fatigue, neck pain, orthopnea, palpitations, peripheral edema, PND, shortness of breath or sweats. There are no associated agents to hypertension. There are no known risk factors for coronary artery disease. Past treatments include nothing. There are no compliance problems.  There is no history of angina, kidney disease, CAD/MI, CVA, heart failure, left ventricular hypertrophy, PVD, renovascular disease or retinopathy. There is no history of chronic renal disease or a hypertension causing med.  Gastroesophageal Reflux  He reports no abdominal pain, no belching, no chest pain, no choking, no coughing, no dysphagia, no early satiety, no globus sensation, no heartburn, no hoarse voice, no nausea, no sore throat, no stridor, no tooth decay, no water brash or no wheezing. This is a chronic problem. The current episode started more than 1 year ago. The problem occurs occasionally. The problem has been gradually improving. Nothing aggravates the symptoms. Pertinent negatives include no anemia, fatigue, melena, muscle weakness, orthopnea or weight loss. There are no known risk factors. He has tried a PPI for the symptoms. The treatment provided moderate relief.  Hyperlipidemia  This is a chronic problem. The current episode started more than 1 year ago. The problem is controlled. Recent lipid tests were reviewed and are normal. He has no history of chronic renal disease, diabetes, hypothyroidism, liver disease, obesity or  nephrotic syndrome. Factors aggravating his hyperlipidemia include thiazides. Associated symptoms include leg pain. Pertinent negatives include no chest pain, focal sensory loss, focal weakness, myalgias or shortness of breath. Current antihyperlipidemic treatment includes statins. The current treatment provides moderate improvement of lipids. There are no compliance problems.   Leg Pain   The incident occurred more than 1 week ago. There was no injury mechanism. The pain is present in the right hip, right thigh, right knee, right ankle and right foot. The pain is at a severity of 10/10. The pain is moderate. The pain has been fluctuating since onset. Pertinent negatives include no inability to bear weight, loss of motion, loss of sensation, muscle weakness, numbness or tingling. The symptoms are aggravated by movement. He has tried non-weight bearing, acetaminophen and NSAIDs for the symptoms. The treatment provided moderate relief.    No problem-specific Assessment & Plan notes found for this encounter.   Past Medical History:  Diagnosis Date  . GERD (gastroesophageal reflux disease)   . Hyperlipidemia   . Hypertension     Past Surgical History:  Procedure Laterality Date  . CARDIAC SURGERY     bypass  . CERVICAL FUSION    . COLONOSCOPY  2011 ?    Family History  Problem Relation Age of Onset  . Hypertension Father     Social History   Social History  . Marital status: Single    Spouse name: N/A  . Number of children: N/A  . Years of education: N/A   Occupational History  . Not on file.   Social History Main Topics  . Smoking status: Former Research scientist (life sciences)  . Smokeless tobacco: Not on file  . Alcohol  use No  . Drug use: No  . Sexual activity: No   Other Topics Concern  . Not on file   Social History Narrative  . No narrative on file    Allergies  Allergen Reactions  . Sulfa Antibiotics Other (See Comments)     Review of Systems  Constitutional: Negative for  chills, fatigue, fever, malaise/fatigue and weight loss.  HENT: Negative for ear discharge, ear pain, hoarse voice and sore throat.   Eyes: Negative for blurred vision and double vision.  Respiratory: Negative for cough, sputum production, choking, shortness of breath and wheezing.   Cardiovascular: Negative for chest pain, palpitations, orthopnea, leg swelling and PND.  Gastrointestinal: Negative for abdominal pain, blood in stool, constipation, diarrhea, dysphagia, heartburn, melena and nausea.  Genitourinary: Negative for dysuria, frequency, hematuria and urgency.  Musculoskeletal: Negative for back pain, joint pain, myalgias, muscle weakness and neck pain.  Skin: Negative for rash.  Neurological: Negative for dizziness, tingling, sensory change, focal weakness, numbness and headaches.  Endo/Heme/Allergies: Negative for environmental allergies and polydipsia. Does not bruise/bleed easily.  Psychiatric/Behavioral: Negative for depression and suicidal ideas. The patient is not nervous/anxious and does not have insomnia.      Objective  Vitals:   12/15/15 0808  BP: 100/66  Pulse: 72  Weight: 183 lb (83 kg)  Height: 5\' 6"  (1.676 m)    Physical Exam  Constitutional: He is oriented to person, place, and time and well-developed, well-nourished, and in no distress.  HENT:  Head: Normocephalic.  Right Ear: External ear normal.  Left Ear: External ear normal.  Nose: Nose normal.  Mouth/Throat: Oropharynx is clear and moist.  Eyes: Conjunctivae and EOM are normal. Pupils are equal, round, and reactive to light. Right eye exhibits no discharge. Left eye exhibits no discharge. No scleral icterus.  Neck: Normal range of motion. Neck supple. No JVD present. No tracheal deviation present. No thyromegaly present.  Cardiovascular: Normal rate, regular rhythm, normal heart sounds and intact distal pulses.  Exam reveals no gallop and no friction rub.   No murmur heard. Pulmonary/Chest: Breath  sounds normal. No respiratory distress. He has no wheezes. He has no rales.  Abdominal: Soft. Bowel sounds are normal. He exhibits no mass. There is no hepatosplenomegaly. There is no tenderness. There is no rebound, no guarding and no CVA tenderness.  Musculoskeletal: Normal range of motion. He exhibits no edema or tenderness.  Lymphadenopathy:    He has no cervical adenopathy.  Neurological: He is alert and oriented to person, place, and time. He has normal sensation, normal strength, normal reflexes and intact cranial nerves. No cranial nerve deficit.  Skin: Skin is warm. No rash noted.  Psychiatric: Mood and affect normal.  Nursing note and vitals reviewed.     Assessment & Plan  Problem List Items Addressed This Visit      Cardiovascular and Mediastinum   Essential hypertension - Primary   Relevant Medications   aspirin EC 81 MG tablet   atorvastatin (LIPITOR) 80 MG tablet   lisinopril (PRINIVIL,ZESTRIL) 5 MG tablet   metoprolol tartrate (LOPRESSOR) 25 MG tablet   Other Relevant Orders   Renal Function Panel     Digestive   Esophageal reflux   Relevant Medications   pantoprazole (PROTONIX) 40 MG tablet     Other   Hyperlipidemia   Relevant Medications   aspirin EC 81 MG tablet   atorvastatin (LIPITOR) 80 MG tablet   lisinopril (PRINIVIL,ZESTRIL) 5 MG tablet   metoprolol tartrate (LOPRESSOR) 25  MG tablet   Other Relevant Orders   Lipid Profile   Radicular leg pain   Relevant Medications   etodolac (LODINE) 500 MG tablet    Other Visit Diagnoses    Flu vaccine need       Relevant Orders   Flu Vaccine QUAD 36+ mos PF IM (Fluarix & Fluzone Quad PF) (Completed)        Dr. Deanna Jones [facility]  12/15/15

## 2015-12-16 LAB — LIPID PANEL
CHOL/HDL RATIO: 2.7 ratio (ref 0.0–5.0)
Cholesterol, Total: 123 mg/dL (ref 100–199)
HDL: 46 mg/dL (ref >39)
LDL CALC: 67 mg/dL (ref 0–99)
TRIGLYCERIDES: 48 mg/dL (ref 0–149)
VLDL Cholesterol Cal: 10 mg/dL (ref 5–40)

## 2015-12-16 LAB — RENAL FUNCTION PANEL
Albumin: 4.1 g/dL (ref 3.6–4.8)
BUN/Creatinine Ratio: 20 (ref 10–24)
BUN: 26 mg/dL (ref 8–27)
CO2: 23 mmol/L (ref 18–29)
CREATININE: 1.33 mg/dL — AB (ref 0.76–1.27)
Calcium: 9.3 mg/dL (ref 8.6–10.2)
Chloride: 105 mmol/L (ref 96–106)
GFR, EST AFRICAN AMERICAN: 63 mL/min/{1.73_m2} (ref >59)
GFR, EST NON AFRICAN AMERICAN: 54 mL/min/{1.73_m2} — AB (ref >59)
GLUCOSE: 96 mg/dL (ref 65–99)
PHOSPHORUS: 2.5 mg/dL (ref 2.5–4.5)
Potassium: 5.1 mmol/L (ref 3.5–5.2)
SODIUM: 144 mmol/L (ref 134–144)

## 2016-03-14 ENCOUNTER — Other Ambulatory Visit: Payer: Self-pay | Admitting: Family Medicine

## 2016-03-14 DIAGNOSIS — I10 Essential (primary) hypertension: Secondary | ICD-10-CM

## 2016-03-14 DIAGNOSIS — E785 Hyperlipidemia, unspecified: Secondary | ICD-10-CM

## 2016-03-14 DIAGNOSIS — K219 Gastro-esophageal reflux disease without esophagitis: Secondary | ICD-10-CM

## 2016-08-01 ENCOUNTER — Other Ambulatory Visit: Payer: Self-pay | Admitting: Family Medicine

## 2016-08-01 DIAGNOSIS — I10 Essential (primary) hypertension: Secondary | ICD-10-CM

## 2016-08-01 DIAGNOSIS — K219 Gastro-esophageal reflux disease without esophagitis: Secondary | ICD-10-CM

## 2016-08-01 DIAGNOSIS — E785 Hyperlipidemia, unspecified: Secondary | ICD-10-CM

## 2016-08-22 ENCOUNTER — Other Ambulatory Visit
Admission: RE | Admit: 2016-08-22 | Discharge: 2016-08-22 | Disposition: A | Discharge status: Home / Self Care | Payer: Medicare Other | Source: Ambulatory Visit | Attending: Family Medicine | Admitting: Family Medicine

## 2016-08-22 ENCOUNTER — Ambulatory Visit (INDEPENDENT_AMBULATORY_CARE_PROVIDER_SITE_OTHER): Payer: Medicare Other | Admitting: Family Medicine

## 2016-08-22 ENCOUNTER — Encounter: Payer: Self-pay | Admitting: Family Medicine

## 2016-08-22 VITALS — BP 110/62 | HR 68 | Ht 66.0 in | Wt 197.0 lb

## 2016-08-22 DIAGNOSIS — I10 Essential (primary) hypertension: Secondary | ICD-10-CM | POA: Insufficient documentation

## 2016-08-22 DIAGNOSIS — Z23 Encounter for immunization: Secondary | ICD-10-CM

## 2016-08-22 DIAGNOSIS — Z1159 Encounter for screening for other viral diseases: Secondary | ICD-10-CM

## 2016-08-22 DIAGNOSIS — M170 Bilateral primary osteoarthritis of knee: Secondary | ICD-10-CM | POA: Insufficient documentation

## 2016-08-22 DIAGNOSIS — K219 Gastro-esophageal reflux disease without esophagitis: Secondary | ICD-10-CM

## 2016-08-22 DIAGNOSIS — E785 Hyperlipidemia, unspecified: Secondary | ICD-10-CM

## 2016-08-22 LAB — RENAL FUNCTION PANEL
Albumin: 4.3 g/dL (ref 3.5–5.0)
Anion gap: 7 (ref 5–15)
BUN: 30 mg/dL — AB (ref 6–20)
CHLORIDE: 107 mmol/L (ref 101–111)
CO2: 26 mmol/L (ref 22–32)
Calcium: 9.4 mg/dL (ref 8.9–10.3)
Creatinine, Ser: 1.51 mg/dL — ABNORMAL HIGH (ref 0.61–1.24)
GFR calc Af Amer: 52 mL/min — ABNORMAL LOW (ref >60)
GFR, EST NON AFRICAN AMERICAN: 45 mL/min — AB (ref >60)
Glucose, Bld: 102 mg/dL — ABNORMAL HIGH (ref 65–99)
POTASSIUM: 4.6 mmol/L (ref 3.5–5.1)
Phosphorus: 2.9 mg/dL (ref 2.5–4.6)
Sodium: 140 mmol/L (ref 135–145)

## 2016-08-22 MED ORDER — PANTOPRAZOLE SODIUM 40 MG PO TBEC: 40.0000 mg | DELAYED_RELEASE_TABLET | Freq: Every day | ORAL | 1 refills | Status: DC

## 2016-08-22 MED ORDER — ASPIRIN EC 81 MG PO TBEC: 81.0000 mg | DELAYED_RELEASE_TABLET | Freq: Every day | ORAL | 3 refills | Status: DC

## 2016-08-22 MED ORDER — ATORVASTATIN CALCIUM 80 MG PO TABS: 80.0000 mg | ORAL_TABLET | Freq: Every day | ORAL | 1 refills | Status: DC

## 2016-08-22 MED ORDER — LISINOPRIL 5 MG PO TABS: 5.0000 mg | ORAL_TABLET | Freq: Every day | ORAL | 1 refills | Status: DC

## 2016-08-22 MED ORDER — METOPROLOL TARTRATE 25 MG PO TABS: 12.5000 mg | ORAL_TABLET | Freq: Two times a day (BID) | ORAL | 1 refills | Status: DC

## 2016-08-22 NOTE — Progress Notes (Signed)
Name: Matthew Humphrey   MRN: [id number]    DOB: [birth year]   Date:08/22/2016       Progress Note  Subjective  Chief Complaint  Chief Complaint  Patient presents with  . Hyperlipidemia  . Hypertension  . Gastroesophageal Reflux  . Arthritis    takes etodolac for inflammation of arthritis in knees    Hypertension  This is a chronic problem. The current episode started more than 1 year ago. The problem is unchanged. The problem is controlled. Pertinent negatives include no anxiety, blurred vision, chest pain, headaches, malaise/fatigue, neck pain, orthopnea, palpitations, peripheral edema, PND, shortness of breath or sweats. There are no associated agents to hypertension. Risk factors for coronary artery disease include dyslipidemia, male gender, obesity and post-menopausal state. Past treatments include ACE inhibitors and beta blockers. The current treatment provides moderate improvement. There are no compliance problems.  There is no history of angina, kidney disease, CAD/MI, CVA, heart failure, left ventricular hypertrophy, PVD or retinopathy. There is no history of chronic renal disease, a hypertension causing med or renovascular disease.  Hyperlipidemia  This is a chronic problem. The current episode started more than 1 year ago. The problem is controlled. Recent lipid tests were reviewed and are normal. Exacerbating diseases include obesity. He has no history of chronic renal disease or diabetes. Factors aggravating his hyperlipidemia include beta blockers. Pertinent negatives include no chest pain, focal weakness, myalgias or shortness of breath. Current antihyperlipidemic treatment includes statins. The current treatment provides mild improvement of lipids. There are no compliance problems.  Risk factors for coronary artery disease include hypertension.  Gastroesophageal Reflux  He reports no abdominal pain, no belching, no chest pain, no choking, no coughing, no dysphagia, no early  satiety, no globus sensation, no heartburn, no hoarse voice, no nausea, no sore throat, no stridor or no wheezing. This is a chronic problem. The problem has been waxing and waning. Nothing aggravates the symptoms. Pertinent negatives include no anemia, fatigue, melena, muscle weakness, orthopnea or weight loss. He has tried a PPI for the symptoms.    No problem-specific Assessment & Plan notes found for this encounter.   Past Medical History:  Diagnosis Date  . GERD (gastroesophageal reflux disease)   . Hyperlipidemia   . Hypertension     Past Surgical History:  Procedure Laterality Date  . CARDIAC SURGERY     bypass  . CERVICAL FUSION    . COLONOSCOPY  2011 ?    Family History  Problem Relation Age of Onset  . Hypertension Father     Social History   Social History  . Marital status: Single    Spouse name: N/A  . Number of children: N/A  . Years of education: N/A   Occupational History  . Not on file.   Social History Main Topics  . Smoking status: Former Research scientist (life sciences)  . Smokeless tobacco: Former Systems developer  . Alcohol use No  . Drug use: No  . Sexual activity: No   Other Topics Concern  . Not on file   Social History Narrative  . No narrative on file    Allergies  Allergen Reactions  . Sulfa Antibiotics Other (See Comments)    Outpatient Medications Prior to Visit  Medication Sig Dispense Refill  . etodolac (LODINE) 500 MG tablet Take 1 tablet (500 mg total) by mouth 2 (two) times daily. 60 tablet 3  . aspirin EC 81 MG tablet Take 81 mg by mouth daily.    Marland Kitchen atorvastatin (LIPITOR) 80  MG tablet TAKE 1 TABLET BY MOUTH  DAILY 90 tablet 0  . lisinopril (PRINIVIL,ZESTRIL) 5 MG tablet TAKE 1 TABLET BY MOUTH  DAILY 90 tablet 0  . metoprolol tartrate (LOPRESSOR) 25 MG tablet TAKE ONE-HALF TABLET BY  MOUTH TWICE A DAY 90 tablet 0  . pantoprazole (PROTONIX) 40 MG tablet TAKE 1 TABLET BY MOUTH  DAILY 90 tablet 0   No facility-administered medications prior to visit.      Review of Systems  Constitutional: Negative for chills, fatigue, fever, malaise/fatigue and weight loss.  HENT: Negative for ear discharge, ear pain, hoarse voice and sore throat.   Eyes: Negative for blurred vision.  Respiratory: Negative for cough, sputum production, choking, shortness of breath and wheezing.   Cardiovascular: Negative for chest pain, palpitations, orthopnea, leg swelling and PND.  Gastrointestinal: Negative for abdominal pain, blood in stool, constipation, diarrhea, dysphagia, heartburn, melena and nausea.  Genitourinary: Negative for dysuria, frequency, hematuria and urgency.  Musculoskeletal: Negative for back pain, joint pain, myalgias, muscle weakness and neck pain.  Skin: Negative for rash.  Neurological: Negative for dizziness, tingling, sensory change, focal weakness and headaches.  Endo/Heme/Allergies: Negative for environmental allergies and polydipsia. Does not bruise/bleed easily.  Psychiatric/Behavioral: Negative for depression and suicidal ideas. The patient is not nervous/anxious and does not have insomnia.      Objective  Vitals:   08/22/16 0807  BP: 110/62  Pulse: 68  Weight: 197 lb (89.4 kg)  Height: 5\' 6"  (1.676 m)    Physical Exam  Constitutional: He is oriented to person, place, and time and well-developed, well-nourished, and in no distress.  HENT:  Head: Normocephalic.  Right Ear: External ear normal.  Left Ear: External ear normal.  Nose: Nose normal.  Mouth/Throat: Oropharynx is clear and moist.  Eyes: Conjunctivae and EOM are normal. Pupils are equal, round, and reactive to light. Right eye exhibits no discharge. Left eye exhibits no discharge. No scleral icterus.  Neck: Normal range of motion. Neck supple. No JVD present. No tracheal deviation present. No thyromegaly present.  Cardiovascular: Normal rate, regular rhythm, normal heart sounds and intact distal pulses.  Exam reveals no gallop and no friction rub.   No murmur  heard. Pulmonary/Chest: Breath sounds normal. No respiratory distress. He has no wheezes. He has no rales.  Abdominal: Soft. Bowel sounds are normal. He exhibits no mass. There is no hepatosplenomegaly. There is no tenderness. There is no rebound, no guarding and no CVA tenderness.  Genitourinary: Rectum normal and prostate normal. Rectal exam shows guaiac negative stool.  Musculoskeletal: Normal range of motion. He exhibits no edema or tenderness.  Lymphadenopathy:    He has no cervical adenopathy.  Neurological: He is alert and oriented to person, place, and time. He has normal sensation, normal strength, normal reflexes and intact cranial nerves. No cranial nerve deficit.  Skin: Skin is warm. No rash noted.  Psychiatric: Mood and affect normal.  Nursing note and vitals reviewed.     Assessment & Plan  Problem List Items Addressed This Visit      Cardiovascular and Mediastinum   Essential hypertension - Primary   Relevant Medications   metoprolol tartrate (LOPRESSOR) 25 MG tablet   lisinopril (PRINIVIL,ZESTRIL) 5 MG tablet   atorvastatin (LIPITOR) 80 MG tablet   aspirin EC 81 MG tablet   Other Relevant Orders   Renal Function Panel     Digestive   Esophageal reflux   Relevant Medications   pantoprazole (PROTONIX) 40 MG tablet  Musculoskeletal and Integument   Primary osteoarthritis of both knees   Relevant Medications   aspirin EC 81 MG tablet     Other   Hyperlipidemia   Relevant Medications   metoprolol tartrate (LOPRESSOR) 25 MG tablet   lisinopril (PRINIVIL,ZESTRIL) 5 MG tablet   atorvastatin (LIPITOR) 80 MG tablet   aspirin EC 81 MG tablet    Other Visit Diagnoses    Need for hepatitis C screening test       Need for vaccination with 13-polyvalent pneumococcal conjugate vaccine       Relevant Orders   Pneumococcal conjugate vaccine 13-valent IM (Completed)      Meds ordered this encounter  Medications  . metoprolol tartrate (LOPRESSOR) 25 MG tablet     Sig: Take 0.5 tablets (12.5 mg total) by mouth 2 (two) times daily.    Dispense:  90 tablet    Refill:  1  . lisinopril (PRINIVIL,ZESTRIL) 5 MG tablet    Sig: Take 1 tablet (5 mg total) by mouth daily.    Dispense:  90 tablet    Refill:  1  . pantoprazole (PROTONIX) 40 MG tablet    Sig: Take 1 tablet (40 mg total) by mouth daily.    Dispense:  90 tablet    Refill:  1  . atorvastatin (LIPITOR) 80 MG tablet    Sig: Take 1 tablet (80 mg total) by mouth daily.    Dispense:  90 tablet    Refill:  1    Please put a note with meds- pt "Must schedule appt for med refill"  . aspirin EC 81 MG tablet    Sig: Take 1 tablet (81 mg total) by mouth daily.    Dispense:  90 tablet    Refill:  3      Dr. Otilio Miu [facility]  08/22/16

## 2016-12-11 ENCOUNTER — Ambulatory Visit
Admission: EM | Admit: 2016-12-11 | Discharge: 2016-12-11 | Disposition: A | Discharge status: Home / Self Care | Payer: Medicare Other | Attending: Family Medicine | Admitting: Family Medicine

## 2016-12-11 ENCOUNTER — Ambulatory Visit (INDEPENDENT_AMBULATORY_CARE_PROVIDER_SITE_OTHER): Payer: Medicare Other

## 2016-12-11 ENCOUNTER — Encounter: Payer: Self-pay | Admitting: Emergency Medicine

## 2016-12-11 DIAGNOSIS — M1710 Unilateral primary osteoarthritis, unspecified knee: Secondary | ICD-10-CM

## 2016-12-11 DIAGNOSIS — M179 Osteoarthritis of knee, unspecified: Secondary | ICD-10-CM | POA: Diagnosis not present

## 2016-12-11 MED ORDER — HYDROCODONE-ACETAMINOPHEN 5-325 MG PO TABS: ORAL_TABLET | ORAL | 0 refills | Status: DC

## 2016-12-11 NOTE — ED Triage Notes (Signed)
Patient c/o right knee pain a week ago.  Patient denies fall or injury.

## 2016-12-11 NOTE — ED Provider Notes (Signed)
[facility]    CSN: [id number] Arrival date & time: 12/11/16  8295     History   Chief Complaint Chief Complaint  Patient presents with  . Knee Pain    HPI Matthew Humphrey is a 71 y.o. male.   71 yo male with a c/o right knee pain for 1 week. States pain is constant but worse with movement. Associated with slight swelling. Patient denies any falls, injuries/trauma, fevers, chills, redness, rash.    The history is provided by the patient.  Knee Pain    Past Medical History:  Diagnosis Date  . GERD (gastroesophageal reflux disease)   . Hyperlipidemia   . Hypertension     Patient Active Problem List   Diagnosis Date Noted  . Primary osteoarthritis of both knees 08/22/2016  . Radicular leg pain 12/15/2015  . Essential hypertension 12/16/2014  . Hyperlipidemia 12/16/2014  . Esophageal reflux 12/16/2014    Past Surgical History:  Procedure Laterality Date  . CARDIAC SURGERY     bypass  . CERVICAL FUSION    . COLONOSCOPY  2011 ?       Home Medications    Prior to Admission medications   Medication Sig Start Date End Date Taking? Authorizing Provider  aspirin EC 81 MG tablet Take 1 tablet (81 mg total) by mouth daily. 08/22/16   Juline Patch, MD  atorvastatin (LIPITOR) 80 MG tablet Take 1 tablet (80 mg total) by mouth daily. 08/22/16   Juline Patch, MD  etodolac (LODINE) 500 MG tablet Take 1 tablet (500 mg total) by mouth 2 (two) times daily. 12/15/15   Juline Patch, MD  HYDROcodone-acetaminophen (NORCO/VICODIN) 5-325 MG tablet 1-2 tabs po qd prn 12/11/16   Norval Gable, MD  lisinopril (PRINIVIL,ZESTRIL) 5 MG tablet Take 1 tablet (5 mg total) by mouth daily. 08/22/16   Juline Patch, MD  metoprolol tartrate (LOPRESSOR) 25 MG tablet Take 0.5 tablets (12.5 mg total) by mouth 2 (two) times daily. 08/22/16   Juline Patch, MD  pantoprazole (PROTONIX) 40 MG tablet Take 1 tablet (40 mg total) by mouth daily. 08/22/16   Juline Patch, MD     Family History Family History  Problem Relation Age of Onset  . Hypertension Father     Social History Social History  Substance Use Topics  . Smoking status: Former Research scientist (life sciences)  . Smokeless tobacco: Former Systems developer  . Alcohol use No     Allergies   Sulfa antibiotics   Review of Systems Review of Systems   Physical Exam Triage Vital Signs ED Triage Vitals  Enc Vitals Group     BP 12/11/16 0845 118/68     Pulse Rate 12/11/16 0845 74     Resp 12/11/16 0845 16     Temp 12/11/16 0845 97.8 F (36.6 C)     Temp Source 12/11/16 0845 Oral     SpO2 12/11/16 0845 99 %     Weight 12/11/16 0843 188 lb (85.3 kg)     Height 12/11/16 0843 5\' 6"  (1.676 m)     Head Circumference --      Peak Flow --      Pain Score 12/11/16 0843 4     Pain Loc --      Pain Edu? --      Excl. in [place]? --    No data found.   Updated Vital Signs BP 118/68 (BP Location: Left Arm)   Pulse 74   Temp 97.8 F (  36.6 C) (Oral)   Resp 16   Ht 5\' 6"  (1.676 m)   Wt 188 lb (85.3 kg)   SpO2 99%   BMI 30.34 kg/m   Visual Acuity Right Eye Distance:   Left Eye Distance:   Bilateral Distance:    Right Eye Near:   Left Eye Near:    Bilateral Near:     Physical Exam  Constitutional: He appears well-developed and well-nourished. No distress.  Musculoskeletal:       Right knee: He exhibits swelling (mild). He exhibits normal range of motion, no effusion, no ecchymosis, no deformity, no laceration, no erythema, normal alignment, no LCL laxity, normal patellar mobility, no bony tenderness, normal meniscus and no MCL laxity. No tenderness found.  Skin: He is not diaphoretic.  Nursing note and vitals reviewed.    UC Treatments / Results  Labs (all labs ordered are listed, but only abnormal results are displayed) Labs Reviewed - No data to display  EKG  EKG Interpretation None       Radiology Dg Knee Complete 4 Views Right  Result Date: 12/11/2016 CLINICAL DATA:  Pain. EXAM: RIGHT KNEE -  COMPLETE 4+ VIEW COMPARISON:  None. FINDINGS: Suprapatellar joint effusion identified. There is mild tricompartment with sharpening the tibial spines and tricompartment marginal spur formation. IMPRESSION: 1. Joint effusion and osteoarthritis. Electronically Signed   By: Kerby Moors M.D.   On: 12/11/2016 09:28    Procedures Procedures (including critical care time)  Medications Ordered in UC Medications - No data to display   Initial Impression / Assessment and Plan / UC Course  I have reviewed the triage vital signs and the nursing notes.  Pertinent labs & imaging results that were available during my care of the patient were reviewed by me and considered in my medical decision making (see chart for details).       Final Clinical Impressions(s) / UC Diagnoses   Final diagnoses:  Arthritis of knee    New Prescriptions Discharge Medication List as of 12/11/2016  9:36 AM    START taking these medications   Details  HYDROcodone-acetaminophen (NORCO/VICODIN) 5-325 MG tablet 1-2 tabs po qd prn, Print       1. x-ray results and diagnosis reviewed with patient 2. rx as per orders above; reviewed possible side effects, interactions, risks and benefits  3. Recommend supportive treatment with rest, ice, elevation, otc NSAIDS prn 4. Follow-up prn if symptoms worsen or don't improve Controlled Substance Prescriptions [place] Controlled Substance Registry consulted? No   Norval Gable, MD 12/11/16 1143

## 2016-12-12 ENCOUNTER — Encounter: Payer: Self-pay | Admitting: Family Medicine

## 2016-12-12 ENCOUNTER — Ambulatory Visit (INDEPENDENT_AMBULATORY_CARE_PROVIDER_SITE_OTHER): Payer: Medicare Other | Admitting: Family Medicine

## 2016-12-12 VITALS — BP 122/68 | HR 80 | Ht 66.0 in | Wt 191.0 lb

## 2016-12-12 DIAGNOSIS — Z23 Encounter for immunization: Secondary | ICD-10-CM | POA: Diagnosis not present

## 2016-12-12 DIAGNOSIS — M1711 Unilateral primary osteoarthritis, right knee: Secondary | ICD-10-CM | POA: Diagnosis not present

## 2016-12-12 MED ORDER — ETODOLAC 500 MG PO TABS: 500.0000 mg | ORAL_TABLET | Freq: Two times a day (BID) | ORAL | 3 refills | Status: DC

## 2016-12-12 NOTE — Patient Instructions (Signed)
Joint Pain Joint pain, which is also called arthralgia, can be caused by many things. Joint pain often goes away when you follow your health care provider's instructions for relieving pain at home. However, joint pain can also be caused by conditions that require further treatment. Common causes of joint pain include:  Bruising in the area of the joint.  Overuse of the joint.  Wear and tear on the joints that occur with aging (osteoarthritis).  Various other forms of arthritis.  A buildup of a crystal form of uric acid in the joint (gout).  Infections of the joint (septic arthritis) or of the bone (osteomyelitis).  Your health care provider may recommend medicine to help with the pain. If your joint pain continues, additional tests may be needed to diagnose your condition. Follow these instructions at home: Watch your condition for any changes. Follow these instructions as directed to lessen the pain that you are feeling.  Take medicines only as directed by your health care provider.  Rest the affected area for as long as your health care provider says that you should. If directed to do so, raise the painful joint above the level of your heart while you are sitting or lying down.  Do not do things that cause or worsen pain.  If directed, apply ice to the painful area: ? Put ice in a plastic bag. ? Place a towel between your skin and the bag. ? Leave the ice on for 20 minutes, 2-3 times per day.  Wear an elastic bandage, splint, or sling as directed by your health care provider. Loosen the elastic bandage or splint if your fingers or toes become numb and tingle, or if they turn cold and blue.  Begin exercising or stretching the affected area as directed by your health care provider. Ask your health care provider what types of exercise are safe for you.  Keep all follow-up visits as directed by your health care provider. This is important.  Contact a health care provider if:  Your  pain increases, and medicine does not help.  Your joint pain does not improve within 3 days.  You have increased bruising or swelling.  You have a fever.  You lose 10 lb (4.5 kg) or more without trying. Get help right away if:  You are not able to move the joint.  Your fingers or toes become numb or they turn cold and blue. This information is not intended to replace advice given to you by your health care provider. Make sure you discuss any questions you have with your health care provider. Document Released: 03/20/2005 Document Revised: 08/20/2015 Document Reviewed: 12/30/2013 Elsevier Interactive Patient Education  2018 Elsevier Inc[employer]  

## 2016-12-12 NOTE — Progress Notes (Signed)
Name: Matthew Humphrey   MRN: [id number]    DOB: 1945-05-14   Date:12/12/2016       Progress Note  Subjective  Chief Complaint  Chief Complaint  Patient presents with  . Knee Pain    R) knee pain- swells at night- throbbing/ aching pain constant- xray shows arthritis but was advised to get referral to ortho    Knee Pain   The incident occurred more than 1 week ago. There was no injury mechanism. The pain is present in the right knee. The quality of the pain is described as aching. The pain is at a severity of 4/10. The pain is moderate. The pain has been constant since onset. Associated symptoms include an inability to bear weight and a loss of motion. Pertinent negatives include no loss of sensation, muscle weakness, numbness or tingling. He reports no foreign bodies present. He has tried NSAIDs (hydrocodone) for the symptoms.    No problem-specific Assessment & Plan notes found for this encounter.   Past Medical History:  Diagnosis Date  . GERD (gastroesophageal reflux disease)   . Hyperlipidemia   . Hypertension     Past Surgical History:  Procedure Laterality Date  . CARDIAC SURGERY     bypass  . CERVICAL FUSION    . COLONOSCOPY  2011 ?    Family History  Problem Relation Age of Onset  . Hypertension Father     Social History   Social History  . Marital status: Single    Spouse name: N/A  . Number of children: N/A  . Years of education: N/A   Occupational History  . Not on file.   Social History Main Topics  . Smoking status: Former Research scientist (life sciences)  . Smokeless tobacco: Former Systems developer  . Alcohol use No  . Drug use: No  . Sexual activity: No   Other Topics Concern  . Not on file   Social History Narrative  . No narrative on file    Allergies  Allergen Reactions  . Sulfa Antibiotics Other (See Comments)    Outpatient Medications Prior to Visit  Medication Sig Dispense Refill  . aspirin EC 81 MG tablet Take 1 tablet (81 mg total) by mouth daily. 90 tablet 3   . atorvastatin (LIPITOR) 80 MG tablet Take 1 tablet (80 mg total) by mouth daily. 90 tablet 1  . HYDROcodone-acetaminophen (NORCO/VICODIN) 5-325 MG tablet 1-2 tabs po qd prn 8 tablet 0  . lisinopril (PRINIVIL,ZESTRIL) 5 MG tablet Take 1 tablet (5 mg total) by mouth daily. 90 tablet 1  . metoprolol tartrate (LOPRESSOR) 25 MG tablet Take 0.5 tablets (12.5 mg total) by mouth 2 (two) times daily. 90 tablet 1  . pantoprazole (PROTONIX) 40 MG tablet Take 1 tablet (40 mg total) by mouth daily. 90 tablet 1  . etodolac (LODINE) 500 MG tablet Take 1 tablet (500 mg total) by mouth 2 (two) times daily. (Patient not taking: Reported on 12/12/2016) 60 tablet 3   No facility-administered medications prior to visit.     Review of Systems  Constitutional: Negative for chills, fever, malaise/fatigue and weight loss.  HENT: Negative for ear discharge, ear pain and sore throat.   Eyes: Negative for blurred vision.  Respiratory: Negative for cough, sputum production, shortness of breath and wheezing.   Cardiovascular: Negative for chest pain, palpitations and leg swelling.  Gastrointestinal: Negative for abdominal pain, blood in stool, constipation, diarrhea, heartburn, melena and nausea.  Genitourinary: Negative for dysuria, frequency, hematuria and urgency.  Musculoskeletal: Negative  for back pain, joint pain, myalgias and neck pain.  Skin: Negative for rash.  Neurological: Negative for dizziness, tingling, sensory change, focal weakness, numbness and headaches.  Endo/Heme/Allergies: Negative for environmental allergies and polydipsia. Does not bruise/bleed easily.  Psychiatric/Behavioral: Negative for depression and suicidal ideas. The patient is not nervous/anxious and does not have insomnia.      Objective  Vitals:   12/12/16 0927  BP: 122/68  Pulse: 80  Weight: 191 lb (86.6 kg)  Height: 5\' 6"  (1.676 m)    Physical Exam  Constitutional: He is oriented to person, place, and time and  well-developed, well-nourished, and in no distress.  HENT:  Head: Normocephalic.  Right Ear: External ear normal.  Left Ear: External ear normal.  Nose: Nose normal.  Mouth/Throat: Oropharynx is clear and moist.  Eyes: Pupils are equal, round, and reactive to light. Conjunctivae and EOM are normal. Right eye exhibits no discharge. Left eye exhibits no discharge. No scleral icterus.  Neck: Normal range of motion. Neck supple. No JVD present. No tracheal deviation present. No thyromegaly present.  Cardiovascular: Normal rate, regular rhythm, normal heart sounds and intact distal pulses.  Exam reveals no gallop and no friction rub.   No murmur heard. Pulmonary/Chest: Breath sounds normal. No respiratory distress. He has no wheezes. He has no rales.  Abdominal: Soft. Bowel sounds are normal. He exhibits no mass. There is no hepatosplenomegaly. There is no tenderness. There is no rebound, no guarding and no CVA tenderness.  Musculoskeletal: Normal range of motion. He exhibits no edema.       Right knee: Tenderness found. Medial joint line and lateral joint line tenderness noted.  popleteal bursa swelling  Lymphadenopathy:    He has no cervical adenopathy.  Neurological: He is alert and oriented to person, place, and time. He has normal sensation, normal strength, normal reflexes and intact cranial nerves. No cranial nerve deficit.  Skin: Skin is warm. No rash noted.  Psychiatric: Mood and affect normal.  Nursing note and vitals reviewed.     Assessment & Plan  Problem List Items Addressed This Visit    None    Visit Diagnoses    Primary osteoarthritis of right knee    -  Primary   Relevant Medications   etodolac (LODINE) 500 MG tablet   Other Relevant Orders   Ambulatory referral to Orthopedic Surgery   Influenza vaccine needed       Relevant Orders   Flu vaccine HIGH DOSE PF (Completed)      Meds ordered this encounter  Medications  . etodolac (LODINE) 500 MG tablet    Sig:  Take 1 tablet (500 mg total) by mouth 2 (two) times daily.    Dispense:  60 tablet    Refill:  3      Dr. Otilio Miu [facility] [place]  12/12/16

## 2016-12-20 DIAGNOSIS — M1711 Unilateral primary osteoarthritis, right knee: Secondary | ICD-10-CM | POA: Insufficient documentation

## 2016-12-20 DIAGNOSIS — E669 Obesity, unspecified: Secondary | ICD-10-CM | POA: Insufficient documentation

## 2016-12-27 ENCOUNTER — Emergency Department
Admission: EM | Admit: 2016-12-27 | Discharge: 2016-12-27 | Disposition: A | Discharge status: Home / Self Care | Payer: Medicare Other | Attending: Emergency Medicine | Admitting: Emergency Medicine

## 2016-12-27 ENCOUNTER — Encounter: Payer: Self-pay | Admitting: Emergency Medicine

## 2016-12-27 DIAGNOSIS — Z7982 Long term (current) use of aspirin: Secondary | ICD-10-CM | POA: Insufficient documentation

## 2016-12-27 DIAGNOSIS — L509 Urticaria, unspecified: Secondary | ICD-10-CM | POA: Diagnosis not present

## 2016-12-27 DIAGNOSIS — T7840XA Allergy, unspecified, initial encounter: Secondary | ICD-10-CM | POA: Diagnosis not present

## 2016-12-27 DIAGNOSIS — I10 Essential (primary) hypertension: Secondary | ICD-10-CM | POA: Diagnosis not present

## 2016-12-27 DIAGNOSIS — R600 Localized edema: Secondary | ICD-10-CM | POA: Diagnosis present

## 2016-12-27 DIAGNOSIS — Z79899 Other long term (current) drug therapy: Secondary | ICD-10-CM | POA: Diagnosis not present

## 2016-12-27 DIAGNOSIS — Z87891 Personal history of nicotine dependence: Secondary | ICD-10-CM | POA: Insufficient documentation

## 2016-12-27 MED ORDER — PREDNISONE 20 MG PO TABS
60.0000 mg | ORAL_TABLET | Freq: Once | ORAL | Status: AC
  Administered 2016-12-27: 60 mg via ORAL
  Filled 2016-12-27: qty 3

## 2016-12-27 MED ORDER — FAMOTIDINE 20 MG PO TABS: 20.0000 mg | ORAL_TABLET | Freq: Every day | ORAL | 0 refills | Status: DC

## 2016-12-27 MED ORDER — DIPHENHYDRAMINE HCL 25 MG PO CAPS: 25.0000 mg | ORAL_CAPSULE | ORAL | 0 refills | Status: DC | PRN

## 2016-12-27 MED ORDER — DIPHENHYDRAMINE HCL 25 MG PO CAPS
25.0000 mg | ORAL_CAPSULE | Freq: Once | ORAL | Status: AC
  Administered 2016-12-27: 25 mg via ORAL

## 2016-12-27 MED ORDER — DIPHENHYDRAMINE HCL 25 MG PO CAPS
ORAL_CAPSULE | ORAL | Status: AC
  Filled 2016-12-27: qty 1

## 2016-12-27 MED ORDER — DIPHENHYDRAMINE HCL 50 MG/ML IJ SOLN
25.0000 mg | Freq: Once | INTRAMUSCULAR | Status: DC
  Filled 2016-12-27: qty 1

## 2016-12-27 MED ORDER — PREDNISONE 20 MG PO TABS: 60.0000 mg | ORAL_TABLET | Freq: Every day | ORAL | 0 refills | Status: DC

## 2016-12-27 MED ORDER — FAMOTIDINE 20 MG PO TABS
40.0000 mg | ORAL_TABLET | Freq: Once | ORAL | Status: AC
  Administered 2016-12-27: 40 mg via ORAL
  Filled 2016-12-27: qty 2

## 2016-12-27 NOTE — ED Provider Notes (Signed)
[facility] Emergency Department Provider Note   ____________________________________________   First MD Initiated Contact with Patient 12/27/16 [phone]     (approximate)  I have reviewed the triage vital signs and the nursing notes.   HISTORY  Chief Complaint Allergic Reaction    HPI Matthew Humphrey is a 71 y.o. male Who comes into the hospital today with some itching of his hands and arms as well as behind his thighs as well as some swelling of his bilateral hands and burning up to the forearms. The patient states that it started yesterday. He's never had anything happen before. He denies any new soaps or lotions. He was recently prescribed Lodine for right knee arthritis. He states that he's haddifficulty swallowing for years. The patient did not take any medicine for the symptoms but he did try some hydrocortisone cream on his thighs. The patient came into the hospital today because he couldn't tolerate the symptoms. He denies any new foods as well.   Past Medical History:  Diagnosis Date  . GERD (gastroesophageal reflux disease)   . Hyperlipidemia   . Hypertension     Patient Active Problem List   Diagnosis Date Noted  . Primary osteoarthritis of both knees 08/22/2016  . Radicular leg pain 12/15/2015  . Essential hypertension 12/16/2014  . Hyperlipidemia 12/16/2014  . Esophageal reflux 12/16/2014    Past Surgical History:  Procedure Laterality Date  . CARDIAC SURGERY     bypass  . CERVICAL FUSION    . COLONOSCOPY  2011 ?    Prior to Admission medications   Medication Sig Start Date End Date Taking? Authorizing Provider  aspirin EC 81 MG tablet Take 1 tablet (81 mg total) by mouth daily. 08/22/16   Juline Patch, MD  atorvastatin (LIPITOR) 80 MG tablet Take 1 tablet (80 mg total) by mouth daily. 08/22/16   Juline Patch, MD  diphenhydrAMINE (BENADRYL) 25 mg capsule Take 1 capsule (25 mg total) by mouth every 4 (four) hours as needed.  12/27/16 12/27/17  Loney Hering, MD  etodolac (LODINE) 500 MG tablet Take 1 tablet (500 mg total) by mouth 2 (two) times daily. 12/12/16   Juline Patch, MD  famotidine (PEPCID) 20 MG tablet Take 1 tablet (20 mg total) by mouth daily. 12/27/16 12/27/17  Loney Hering, MD  HYDROcodone-acetaminophen (NORCO/VICODIN) 5-325 MG tablet 1-2 tabs po qd prn 12/11/16   Norval Gable, MD  lisinopril (PRINIVIL,ZESTRIL) 5 MG tablet Take 1 tablet (5 mg total) by mouth daily. 08/22/16   Juline Patch, MD  metoprolol tartrate (LOPRESSOR) 25 MG tablet Take 0.5 tablets (12.5 mg total) by mouth 2 (two) times daily. 08/22/16   Juline Patch, MD  pantoprazole (PROTONIX) 40 MG tablet Take 1 tablet (40 mg total) by mouth daily. 08/22/16   Juline Patch, MD  predniSONE (DELTASONE) 20 MG tablet Take 3 tablets (60 mg total) by mouth daily. 12/27/16   Loney Hering, MD    Allergies Sulfa antibiotics  Family History  Problem Relation Age of Onset  . Hypertension Father     Social History Social History  Substance Use Topics  . Smoking status: Former Research scientist (life sciences)  . Smokeless tobacco: Former Systems developer  . Alcohol use No    Review of Systems  Constitutional: No fever/chills Eyes: No visual changes. ENT: No sore throat. Cardiovascular: Denies chest pain. Respiratory: Denies shortness of breath. Gastrointestinal: No abdominal pain.  No nausea, no vomiting.  No diarrhea.  No  constipation. Genitourinary: Negative for dysuria. Musculoskeletal: Negative for back pain. Skin: hives and itching Neurological: Negative for headaches, focal weakness or numbness.   ____________________________________________   PHYSICAL EXAM:  VITAL SIGNS: ED Triage Vitals  Enc Vitals Group     BP 12/27/16 0311 126/89     Pulse Rate 12/27/16 0311 99     Resp 12/27/16 0311 18     Temp 12/27/16 0311 98.8 F (37.1 C)     Temp Source 12/27/16 0311 Oral     SpO2 12/27/16 0311 96 %     Weight 12/27/16 0309 191 lb (86.6 kg)       Height 12/27/16 0309 5\' 6"  (1.676 m)     Head Circumference --      Peak Flow --      Pain Score 12/27/16 0319 0     Pain Loc --      Pain Edu? --      Excl. in [place]? --     Constitutional: Alert and oriented. Well appearing and in moderate distress. patient constantly itching hands bilaterally. Eyes: Conjunctivae are normal. PERRL. EOMI. Head: Atraumatic. Nose: No congestion/rhinnorhea. Mouth/Throat: Mucous membranes are moist.  Oropharynx non-erythematous. Cardiovascular: Normal rate, regular rhythm. Grossly normal heart sounds.  Good peripheral circulation. Respiratory: Normal respiratory effort.  No retractions. Lungs CTAB. Gastrointestinal: Soft and nontender. No distention. positive bowel sounds Musculoskeletal: No lower extremity tenderness nor edema.   Neurologic:  Normal speech and language. Skin:  Skin is warm, dry and intact. hives to left antecubital fossa as well as posterior thighs bilaterally Psychiatric: Mood and affect are normal. Speech and behavior are normal.  ____________________________________________   LABS (all labs ordered are listed, but only abnormal results are displayed)  Labs Reviewed - No data to display ____________________________________________  EKG  none ____________________________________________  RADIOLOGY  No results found.  ____________________________________________   PROCEDURES  Procedure(s) performed: None  Procedures  Critical Care performed: No  ____________________________________________   INITIAL IMPRESSION / ASSESSMENT AND PLAN / ED COURSE  Pertinent labs & imaging results that were available during my care of the patient were reviewed by me and considered in my medical decision making (see chart for details).  This is a 71 year old male who comes into the hospital today with some hives and itching and swelling of the hands. I feel that the patient may be having allergic reaction. I will give him a dose  of Benadryl, Pepcid and some prednisone. The patient will be reassessed.     The patient's hives and itching are improved. He'll be discharged to home to follow-up with his primary care physician for further evaluation of his symptoms. ____________________________________________   FINAL CLINICAL IMPRESSION(S) / ED DIAGNOSES  Final diagnoses:  Allergic reaction, initial encounter  Urticaria      NEW MEDICATIONS STARTED DURING THIS VISIT:  New Prescriptions   DIPHENHYDRAMINE (BENADRYL) 25 MG CAPSULE    Take 1 capsule (25 mg total) by mouth every 4 (four) hours as needed.   FAMOTIDINE (PEPCID) 20 MG TABLET    Take 1 tablet (20 mg total) by mouth daily.   PREDNISONE (DELTASONE) 20 MG TABLET    Take 3 tablets (60 mg total) by mouth daily.     Note:  This document was prepared using Dragon voice recognition software and may include unintentional dictation errors.    Loney Hering, MD 12/27/16 [phone]

## 2016-12-27 NOTE — ED Notes (Signed)
Pt stated he had an allergic reaction yesterday while helping somebody move sheet tobacco. Has hives and itching and burning on arms and behind legs.

## 2016-12-27 NOTE — ED Triage Notes (Signed)
Patient ambulatory to triage with steady gait, without difficulty or distress noted; pt reports swelling to hands and hives to arms/legs x 2 days; rx lodine recently for arthritis

## 2017-01-29 ENCOUNTER — Other Ambulatory Visit: Payer: Self-pay | Admitting: Family Medicine

## 2017-01-29 DIAGNOSIS — K219 Gastro-esophageal reflux disease without esophagitis: Secondary | ICD-10-CM

## 2017-01-29 DIAGNOSIS — E785 Hyperlipidemia, unspecified: Secondary | ICD-10-CM

## 2017-01-29 DIAGNOSIS — I10 Essential (primary) hypertension: Secondary | ICD-10-CM

## 2017-02-23 ENCOUNTER — Ambulatory Visit: Payer: Self-pay | Admitting: Family Medicine

## 2017-03-05 ENCOUNTER — Telehealth: Payer: Self-pay

## 2017-03-05 NOTE — Telephone Encounter (Signed)
Called to in an attempt to schedule AWV - Initial with Nurse Health Advisor. LVM requesting returned call. Currently scheduled to be seen for f/u with Dr. Ronnald Ramp on 03/06/17.

## 2017-03-06 ENCOUNTER — Encounter: Payer: Self-pay | Admitting: Family Medicine

## 2017-03-06 ENCOUNTER — Ambulatory Visit (INDEPENDENT_AMBULATORY_CARE_PROVIDER_SITE_OTHER): Payer: Medicare Other | Admitting: Family Medicine

## 2017-03-06 DIAGNOSIS — K219 Gastro-esophageal reflux disease without esophagitis: Secondary | ICD-10-CM

## 2017-03-06 DIAGNOSIS — E785 Hyperlipidemia, unspecified: Secondary | ICD-10-CM | POA: Diagnosis not present

## 2017-03-06 DIAGNOSIS — I10 Essential (primary) hypertension: Secondary | ICD-10-CM

## 2017-03-06 MED ORDER — PANTOPRAZOLE SODIUM 40 MG PO TBEC: 40.0000 mg | DELAYED_RELEASE_TABLET | Freq: Every day | ORAL | 1 refills | Status: DC

## 2017-03-06 MED ORDER — ATORVASTATIN CALCIUM 80 MG PO TABS: 80.0000 mg | ORAL_TABLET | Freq: Every day | ORAL | 1 refills | Status: DC

## 2017-03-06 MED ORDER — LISINOPRIL 5 MG PO TABS: 5.0000 mg | ORAL_TABLET | Freq: Every day | ORAL | 1 refills | Status: DC

## 2017-03-06 MED ORDER — METOPROLOL TARTRATE 25 MG PO TABS: ORAL_TABLET | ORAL | 1 refills | Status: DC

## 2017-03-06 NOTE — Progress Notes (Signed)
Name: Matthew Humphrey   MRN: [id number]    DOB: 10/31/1945   Date:03/06/2017       Progress Note  Subjective  Chief Complaint  Chief Complaint  Patient presents with  . Hypertension  . Hyperlipidemia  . Gastroesophageal Reflux    Hypertension  This is a chronic problem. The current episode started more than 1 year ago. The problem is controlled. Pertinent negatives include no anxiety, blurred vision, chest pain, headaches, malaise/fatigue, neck pain, orthopnea, palpitations, peripheral edema, PND, shortness of breath or sweats. There are no associated agents to hypertension. There are no known risk factors for coronary artery disease. Past treatments include beta blockers and ACE inhibitors. The current treatment provides moderate improvement. There are no compliance problems.  There is no history of angina, kidney disease, CAD/MI, CVA, heart failure, left ventricular hypertrophy, PVD or retinopathy. There is no history of chronic renal disease, a hypertension causing med or renovascular disease.  Hyperlipidemia  This is a chronic problem. The current episode started more than 1 year ago. The problem is controlled. Recent lipid tests were reviewed and are normal. He has no history of chronic renal disease, diabetes, hypothyroidism, liver disease, obesity or nephrotic syndrome. Pertinent negatives include no chest pain, focal sensory loss, focal weakness, leg pain, myalgias or shortness of breath. Current antihyperlipidemic treatment includes statins. The current treatment provides moderate improvement of lipids. There are no compliance problems.  Risk factors for coronary artery disease include hypertension.  Gastroesophageal Reflux  He reports no abdominal pain, no chest pain, no choking, no coughing, no heartburn, no nausea, no sore throat or no wheezing. This is a chronic problem. The current episode started more than 1 year ago. Pertinent negatives include no melena or weight loss.    No  problem-specific Assessment & Plan notes found for this encounter.   Past Medical History:  Diagnosis Date  . GERD (gastroesophageal reflux disease)   . Hyperlipidemia   . Hypertension     Past Surgical History:  Procedure Laterality Date  . CARDIAC SURGERY     bypass  . CERVICAL FUSION    . COLONOSCOPY  2011 ?    Family History  Problem Relation Age of Onset  . Hypertension Father     Social History   Socioeconomic History  . Marital status: Single    Spouse name: Not on file  . Number of children: Not on file  . Years of education: Not on file  . Highest education level: Not on file  Social Needs  . Financial resource strain: Not on file  . Food insecurity - worry: Not on file  . Food insecurity - inability: Not on file  . Transportation needs - medical: Not on file  . Transportation needs - non-medical: Not on file  Occupational History  . Not on file  Tobacco Use  . Smoking status: Former Research scientist (life sciences)  . Smokeless tobacco: Former Network engineer and Sexual Activity  . Alcohol use: No    Alcohol/week: 0.0 oz  . Drug use: No  . Sexual activity: No  Other Topics Concern  . Not on file  Social History Narrative  . Not on file    Allergies  Allergen Reactions  . Sulfa Antibiotics Other (See Comments)    Outpatient Medications Prior to Visit  Medication Sig Dispense Refill  . aspirin EC 81 MG tablet Take 1 tablet (81 mg total) by mouth daily. 90 tablet 3  . diphenhydrAMINE (BENADRYL) 25 mg capsule Take 1 capsule (  25 mg total) by mouth every 4 (four) hours as needed. 20 capsule 0  . famotidine (PEPCID) 20 MG tablet Take 1 tablet (20 mg total) by mouth daily. 7 tablet 0  . atorvastatin (LIPITOR) 80 MG tablet TAKE 1 TABLET BY MOUTH  DAILY 90 tablet 0  . lisinopril (PRINIVIL,ZESTRIL) 5 MG tablet TAKE 1 TABLET BY MOUTH  DAILY 90 tablet 0  . metoprolol tartrate (LOPRESSOR) 25 MG tablet TAKE ONE-HALF TABLET BY  MOUTH TWO TIMES DAILY 90 tablet 0  . pantoprazole  (PROTONIX) 40 MG tablet TAKE 1 TABLET BY MOUTH  DAILY 90 tablet 0  . etodolac (LODINE) 500 MG tablet Take 1 tablet (500 mg total) by mouth 2 (two) times daily. (Patient not taking: Reported on 03/06/2017) 60 tablet 3  . HYDROcodone-acetaminophen (NORCO/VICODIN) 5-325 MG tablet 1-2 tabs po qd prn 8 tablet 0  . predniSONE (DELTASONE) 20 MG tablet Take 3 tablets (60 mg total) by mouth daily. 12 tablet 0   No facility-administered medications prior to visit.     Review of Systems  Constitutional: Negative for chills, fever, malaise/fatigue and weight loss.  HENT: Negative for ear discharge, ear pain and sore throat.   Eyes: Negative for blurred vision.  Respiratory: Negative for cough, sputum production, choking, shortness of breath and wheezing.   Cardiovascular: Negative for chest pain, palpitations, orthopnea, leg swelling and PND.  Gastrointestinal: Negative for abdominal pain, blood in stool, constipation, diarrhea, heartburn, melena and nausea.  Genitourinary: Negative for dysuria, frequency, hematuria and urgency.  Musculoskeletal: Negative for back pain, joint pain, myalgias and neck pain.  Skin: Negative for rash.  Neurological: Negative for dizziness, tingling, sensory change, focal weakness and headaches.  Endo/Heme/Allergies: Negative for environmental allergies and polydipsia. Does not bruise/bleed easily.  Psychiatric/Behavioral: Negative for depression and suicidal ideas. The patient is not nervous/anxious and does not have insomnia.      Objective  Vitals:   03/06/17 1359  BP: 120/62  Pulse: 68  Weight: 191 lb (86.6 kg)  Height: 5\' 6"  (1.676 m)    Physical Exam  Constitutional: He is oriented to person, place, and time and well-developed, well-nourished, and in no distress.  HENT:  Head: Normocephalic.  Right Ear: External ear normal.  Left Ear: External ear normal.  Nose: Nose normal.  Mouth/Throat: Oropharynx is clear and moist.  Eyes: Conjunctivae and EOM are  normal. Pupils are equal, round, and reactive to light. Right eye exhibits no discharge. Left eye exhibits no discharge. No scleral icterus.  Neck: Normal range of motion. Neck supple. No JVD present. No tracheal deviation present. No thyromegaly present.  Cardiovascular: Normal rate, regular rhythm, normal heart sounds and intact distal pulses. Exam reveals no gallop and no friction rub.  No murmur heard. Pulmonary/Chest: Breath sounds normal. No respiratory distress. He has no wheezes. He has no rales.  Abdominal: Soft. Bowel sounds are normal. He exhibits no mass. There is no hepatosplenomegaly. There is no tenderness. There is no rebound, no guarding and no CVA tenderness.  Musculoskeletal: Normal range of motion. He exhibits no edema or tenderness.  Lymphadenopathy:    He has no cervical adenopathy.  Neurological: He is alert and oriented to person, place, and time. He has normal sensation, normal strength, normal reflexes and intact cranial nerves. No cranial nerve deficit.  Skin: Skin is warm. No rash noted.  Psychiatric: Mood and affect normal.  Nursing note and vitals reviewed.     Assessment & Plan  Problem List Items Addressed This Visit  Cardiovascular and Mediastinum   Essential hypertension   Relevant Medications   atorvastatin (LIPITOR) 80 MG tablet   lisinopril (PRINIVIL,ZESTRIL) 5 MG tablet   metoprolol tartrate (LOPRESSOR) 25 MG tablet     Digestive   Esophageal reflux   Relevant Medications   pantoprazole (PROTONIX) 40 MG tablet     Other   Hyperlipidemia   Relevant Medications   atorvastatin (LIPITOR) 80 MG tablet   lisinopril (PRINIVIL,ZESTRIL) 5 MG tablet   metoprolol tartrate (LOPRESSOR) 25 MG tablet      Meds ordered this encounter  Medications  . atorvastatin (LIPITOR) 80 MG tablet    Sig: Take 1 tablet (80 mg total) by mouth daily.    Dispense:  90 tablet    Refill:  1    sched appt for meds  . pantoprazole (PROTONIX) 40 MG tablet     Sig: Take 1 tablet (40 mg total) by mouth daily.    Dispense:  90 tablet    Refill:  1    sched appt for visit  . lisinopril (PRINIVIL,ZESTRIL) 5 MG tablet    Sig: Take 1 tablet (5 mg total) by mouth daily.    Dispense:  90 tablet    Refill:  1    sched appt for med refill  . metoprolol tartrate (LOPRESSOR) 25 MG tablet    Sig: TAKE ONE-HALF TABLET BY  MOUTH TWO TIMES DAILY    Dispense:  90 tablet    Refill:  1    sched appt for meds      Dr. Anita Laguna [facility]  03/06/17

## 2017-04-23 ENCOUNTER — Emergency Department: Payer: Medicare Other

## 2017-04-23 ENCOUNTER — Encounter: Payer: Self-pay | Admitting: Family Medicine

## 2017-04-23 ENCOUNTER — Emergency Department
Admission: EM | Admit: 2017-04-23 | Discharge: 2017-04-23 | Disposition: A | Discharge status: Home / Self Care | Payer: Medicare Other | Attending: Emergency Medicine | Admitting: Emergency Medicine

## 2017-04-23 ENCOUNTER — Other Ambulatory Visit: Payer: Self-pay

## 2017-04-23 ENCOUNTER — Ambulatory Visit (INDEPENDENT_AMBULATORY_CARE_PROVIDER_SITE_OTHER): Payer: Medicare Other | Admitting: Family Medicine

## 2017-04-23 ENCOUNTER — Encounter: Payer: Self-pay | Admitting: Emergency Medicine

## 2017-04-23 VITALS — BP 138/64 | HR 88 | Ht 66.0 in | Wt 200.0 lb

## 2017-04-23 DIAGNOSIS — R221 Localized swelling, mass and lump, neck: Secondary | ICD-10-CM | POA: Diagnosis not present

## 2017-04-23 DIAGNOSIS — I10 Essential (primary) hypertension: Secondary | ICD-10-CM | POA: Diagnosis not present

## 2017-04-23 DIAGNOSIS — M4802 Spinal stenosis, cervical region: Secondary | ICD-10-CM

## 2017-04-23 DIAGNOSIS — G4452 New daily persistent headache (NDPH): Secondary | ICD-10-CM

## 2017-04-23 DIAGNOSIS — B029 Zoster without complications: Secondary | ICD-10-CM | POA: Insufficient documentation

## 2017-04-23 DIAGNOSIS — Z7982 Long term (current) use of aspirin: Secondary | ICD-10-CM | POA: Diagnosis not present

## 2017-04-23 DIAGNOSIS — Z87891 Personal history of nicotine dependence: Secondary | ICD-10-CM | POA: Insufficient documentation

## 2017-04-23 DIAGNOSIS — R51 Headache: Secondary | ICD-10-CM | POA: Insufficient documentation

## 2017-04-23 DIAGNOSIS — M5030 Other cervical disc degeneration, unspecified cervical region: Secondary | ICD-10-CM | POA: Diagnosis not present

## 2017-04-23 DIAGNOSIS — R519 Headache, unspecified: Secondary | ICD-10-CM

## 2017-04-23 DIAGNOSIS — M542 Cervicalgia: Secondary | ICD-10-CM | POA: Diagnosis not present

## 2017-04-23 DIAGNOSIS — Z79899 Other long term (current) drug therapy: Secondary | ICD-10-CM | POA: Insufficient documentation

## 2017-04-23 MED ORDER — FAMCICLOVIR 500 MG PO TABS: 500.0000 mg | ORAL_TABLET | Freq: Three times a day (TID) | ORAL | 0 refills | Status: DC

## 2017-04-23 MED ORDER — PREDNISONE 10 MG (21) PO TBPK: ORAL_TABLET | ORAL | 0 refills | Status: DC

## 2017-04-23 MED ORDER — GABAPENTIN 300 MG PO CAPS: 300.0000 mg | ORAL_CAPSULE | Freq: Every day | ORAL | 3 refills | Status: DC

## 2017-04-23 MED ORDER — OXYCODONE-ACETAMINOPHEN 5-325 MG PO TABS: 1.0000 | ORAL_TABLET | Freq: Four times a day (QID) | ORAL | 0 refills | Status: DC | PRN

## 2017-04-23 NOTE — ED Triage Notes (Signed)
First nurse note:  Pt to ed from MDs office for acute onset of neck pain on right side of neck radiating into back of head.

## 2017-04-23 NOTE — Discharge Instructions (Signed)
Follow-up with Dr. Ronnald Ramp if you are not better in 3 days.  If you break out in a rash in the area then this is definitely shingles.  The radiation of the pain is typical of shingles and degenerative disc problems.  Use the medications as prescribed, if you are worsening please return to the emergency department

## 2017-04-23 NOTE — ED Triage Notes (Signed)
States pain R shoulder, up R side of neck and headache since yesterday. Increases with movement. Denies fall or injury.

## 2017-04-23 NOTE — ED Provider Notes (Signed)
[facility] Emergency Department Provider Note  ____________________________________________   First MD Initiated Contact with Patient 04/23/17 1252     (approximate)  I have reviewed the triage vital signs and the nursing notes.   HISTORY  Chief Complaint Shoulder Pain and Headache    HPI Matthew Humphrey is a 72 y.o. male was sent here by his regular doctor.  He has had a bad headache that radiates from the neck to the right side of the head.  He denies any change in vision, vomiting, chest pain or shortness of breath.  Patient has a history of degenerative disc disease with a fusion to the cervical spine.  He states his right shoulder hurts a little bit.  He states it feels like a pin sticking into his scalp   Past Medical History:  Diagnosis Date  . GERD (gastroesophageal reflux disease)   . Hyperlipidemia   . Hypertension     Patient Active Problem List   Diagnosis Date Noted  . Primary osteoarthritis of both knees 08/22/2016  . Radicular leg pain 12/15/2015  . Essential hypertension 12/16/2014  . Hyperlipidemia 12/16/2014  . Esophageal reflux 12/16/2014    Past Surgical History:  Procedure Laterality Date  . CARDIAC SURGERY     bypass  . CERVICAL FUSION    . COLONOSCOPY  2011 ?    Prior to Admission medications   Medication Sig Start Date End Date Taking? Authorizing Provider  aspirin EC 81 MG tablet Take 1 tablet (81 mg total) by mouth daily. 08/22/16   Juline Patch, MD  atorvastatin (LIPITOR) 80 MG tablet Take 1 tablet (80 mg total) by mouth daily. 03/06/17   Juline Patch, MD  famciclovir (FAMVIR) 500 MG tablet Take 1 tablet (500 mg total) by mouth 3 (three) times daily. 04/23/17   Rukia Mcgillivray, Linden Dolin, PA-C  famotidine (PEPCID) 20 MG tablet Take 1 tablet (20 mg total) by mouth daily. 12/27/16 12/27/17  Loney Hering, MD  gabapentin (NEURONTIN) 300 MG capsule Take 1 capsule (300 mg total) by mouth at bedtime. 04/23/17   Zanyah Lentsch,  Linden Dolin, PA-C  lisinopril (PRINIVIL,ZESTRIL) 5 MG tablet Take 1 tablet (5 mg total) by mouth daily. 03/06/17   Juline Patch, MD  metoprolol tartrate (LOPRESSOR) 25 MG tablet TAKE ONE-HALF TABLET BY  MOUTH TWO TIMES DAILY 03/06/17   Juline Patch, MD  oxyCODONE-acetaminophen (PERCOCET/ROXICET) 5-325 MG tablet Take 1 tablet by mouth every 6 (six) hours as needed for severe pain. 04/23/17   Redell Bhandari, Linden Dolin, PA-C  pantoprazole (PROTONIX) 40 MG tablet Take 1 tablet (40 mg total) by mouth daily. 03/06/17   Juline Patch, MD  predniSONE (STERAPRED UNI-PAK 21 TAB) 10 MG (21) TBPK tablet Take 6 pills on day one then decrease by 1 pill each day 04/23/17   Versie Starks, PA-C    Allergies Sulfa antibiotics  Family History  Problem Relation Age of Onset  . Hypertension Father     Social History Social History   Tobacco Use  . Smoking status: Former Research scientist (life sciences)  . Smokeless tobacco: Former Network engineer Use Topics  . Alcohol use: No    Alcohol/week: 0.0 oz  . Drug use: No    Review of Systems  Constitutional: No fever/chills, positive headache Eyes: No visual changes. ENT: No sore throat. Respiratory: Denies cough CARDIAC: Denies chest pain or shortness of breath ABD: Denies abdominal pain, nausea, vomiting Genitourinary: Negative for dysuria. Musculoskeletal: Negative for back pain. Skin:  Negative for rash.    ____________________________________________   PHYSICAL EXAM:  VITAL SIGNS: ED Triage Vitals [04/23/17 1212]  Enc Vitals Group     BP 136/78     Pulse Rate 72     Resp 18     Temp 97.7 F (36.5 C)     Temp Source Oral     SpO2 98 %     Weight 200 lb (90.7 kg)     Height 5\' 6"  (1.676 m)     Head Circumference      Peak Flow      Pain Score 7     Pain Loc      Pain Edu?      Excl. in [place]?     Constitutional: Alert and oriented. Well appearing and in no acute distress. Eyes: Conjunctivae are normal.  Head: Atraumatic.  No rash noted Nose: No  congestion/rhinnorhea. Mouth/Throat: Mucous membranes are moist.  Throat is NECK: Is supple, no lymphadenopathy noted. Cardiovascular: Normal rate, regular rhythm.  Heart sounds are normal.  No bruits noted Respiratory: Normal respiratory effort.  No retractions, lungs are clear to auscultation GU: deferred Musculoskeletal: FROM all extremities, warm and well perfused, right shoulder is a little tender to palpation Neurologic:  Normal speech and language.  Skin:  Skin is warm, dry and intact. No rash noted. Psychiatric: Mood and affect are normal. Speech and behavior are normal.  ____________________________________________   LABS (all labs ordered are listed, but only abnormal results are displayed)  Labs Reviewed - No data to display ____________________________________________   ____________________________________________  RADIOLOGY  CT of the head is negative for any acute findings CT of the C-spine is negative for any new degenerative findings  ____________________________________________   PROCEDURES  Procedure(s) performed: No  Procedures    ____________________________________________   INITIAL IMPRESSION / ASSESSMENT AND PLAN / ED COURSE  Pertinent labs & imaging results that were available during my care of the patient were reviewed by me and considered in my medical decision making (see chart for details).  The patient is a 72 year old male that was referred here by his primary care doctor for acute onset of headache.  He states the pain radiates from the right shoulder of the right side of the head.  He denies chest pain or shortness of breath.  He denies abdominal pain.  On physical exam he appears to be uncomfortable.  Otherwise the exam is benign  CT of the head is negative for any acute findings.  CT of the C-spine is negative for any acute findings  Discussed the symptoms with the patient.  It tends to appear to be an early onset of shingles.   Explained to him that he can have the pain without the rash.  Medication as prescribed for shingles.  Famvir, gabapentin, Sterapred, and Percocet.  The patient was instructed to follow-up with his doctor.  If he is worsening he is to return to the emergency department for reevaluation.  The patient states he understands will comply with our instructions.  He was discharged in stable condition     As part of my medical decision making, I reviewed the following data within the [place] notes reviewed and incorporated, Radiograph reviewed , Notes from prior ED visits  ____________________________________________   FINAL CLINICAL IMPRESSION(S) / ED DIAGNOSES  Final diagnoses:  Herpes zoster without complication  Bad headache      NEW MEDICATIONS STARTED DURING THIS VISIT:  New Prescriptions   FAMCICLOVIR (  FAMVIR) 500 MG TABLET    Take 1 tablet (500 mg total) by mouth 3 (three) times daily.   GABAPENTIN (NEURONTIN) 300 MG CAPSULE    Take 1 capsule (300 mg total) by mouth at bedtime.   OXYCODONE-ACETAMINOPHEN (PERCOCET/ROXICET) 5-325 MG TABLET    Take 1 tablet by mouth every 6 (six) hours as needed for severe pain.   PREDNISONE (STERAPRED UNI-PAK 21 TAB) 10 MG (21) TBPK TABLET    Take 6 pills on day one then decrease by 1 pill each day     Note:  This document was prepared using Dragon voice recognition software and may include unintentional dictation errors.    Versie Starks, PA-C 04/23/17 1445    Darel Hong, MD 04/23/17 [phone]

## 2017-04-23 NOTE — Progress Notes (Signed)
Name: Matthew Humphrey   MRN: [id number]    DOB: Mar 03, 1946   Date:04/23/2017       Progress Note  Subjective  Chief Complaint  Chief Complaint  Patient presents with  . Headache    started yesterday at the base of the neck/ R) side, radiates up R) side of head above ear to the front. Has the "worse headache he's ever had"    Headache   This is a new problem. The current episode started yesterday. The problem occurs constantly. The problem has been unchanged. The pain is located in the right unilateral, occipital, parietal, temporal and frontal region. The pain does not radiate. The quality of the pain is described as aching. The pain is at a severity of 7/10. The pain is moderate. Associated symptoms include a fever, muscle aches and neck pain. Pertinent negatives include no abdominal pain, back pain, blurred vision, coughing, dizziness, drainage, ear pain, eye pain, hearing loss, insomnia, loss of balance, nausea, numbness, phonophobia, photophobia, scalp tenderness, sinus pressure, sore throat, tingling, weakness or weight loss. The symptoms are aggravated by activity (flexion/twisting neck). He has tried acetaminophen and NSAIDs for the symptoms. The treatment provided no relief. His past medical history is significant for hypertension and migraine headaches. There is no history of cancer, cluster headaches or recent head traumas.  Neck Pain   This is a new problem. The current episode started today. The problem occurs constantly. The problem has been gradually worsening. The pain is associated with nothing. The pain is present in the right side and occipital region. The quality of the pain is described as aching. The pain is at a severity of 7/10. The pain is moderate. The symptoms are aggravated by bending and twisting. The pain is same all the time. Associated symptoms include a fever and headaches. Pertinent negatives include no chest pain, numbness, photophobia, tingling, weakness or weight  loss. He has tried NSAIDs and acetaminophen (anterior cervical fusion/[facility]) for the symptoms. The treatment provided no relief.    No problem-specific Assessment & Plan notes found for this encounter.   Past Medical History:  Diagnosis Date  . GERD (gastroesophageal reflux disease)   . Hyperlipidemia   . Hypertension     Past Surgical History:  Procedure Laterality Date  . CARDIAC SURGERY     bypass  . CERVICAL FUSION    . COLONOSCOPY  2011 ?    Family History  Problem Relation Age of Onset  . Hypertension Father     Social History   Socioeconomic History  . Marital status: Single    Spouse name: Not on file  . Number of children: Not on file  . Years of education: Not on file  . Highest education level: Not on file  Social Needs  . Financial resource strain: Not on file  . Food insecurity - worry: Not on file  . Food insecurity - inability: Not on file  . Transportation needs - medical: Not on file  . Transportation needs - non-medical: Not on file  Occupational History  . Not on file  Tobacco Use  . Smoking status: Former Research scientist (life sciences)  . Smokeless tobacco: Former Network engineer and Sexual Activity  . Alcohol use: No    Alcohol/week: 0.0 oz  . Drug use: No  . Sexual activity: No  Other Topics Concern  . Not on file  Social History Narrative  . Not on file    Allergies  Allergen Reactions  . Sulfa Antibiotics Other (  See Comments)    Outpatient Medications Prior to Visit  Medication Sig Dispense Refill  . aspirin EC 81 MG tablet Take 1 tablet (81 mg total) by mouth daily. 90 tablet 3  . atorvastatin (LIPITOR) 80 MG tablet Take 1 tablet (80 mg total) by mouth daily. 90 tablet 1  . famotidine (PEPCID) 20 MG tablet Take 1 tablet (20 mg total) by mouth daily. 7 tablet 0  . lisinopril (PRINIVIL,ZESTRIL) 5 MG tablet Take 1 tablet (5 mg total) by mouth daily. 90 tablet 1  . metoprolol tartrate (LOPRESSOR) 25 MG tablet TAKE ONE-HALF TABLET BY  MOUTH TWO TIMES  DAILY 90 tablet 1  . pantoprazole (PROTONIX) 40 MG tablet Take 1 tablet (40 mg total) by mouth daily. 90 tablet 1  . etodolac (LODINE) 500 MG tablet Take 1 tablet (500 mg total) by mouth 2 (two) times daily. (Patient not taking: Reported on 03/06/2017) 60 tablet 3  . diphenhydrAMINE (BENADRYL) 25 mg capsule Take 1 capsule (25 mg total) by mouth every 4 (four) hours as needed. 20 capsule 0   No facility-administered medications prior to visit.     Review of Systems  Constitutional: Positive for fever. Negative for chills, malaise/fatigue and weight loss.  HENT: Negative for ear discharge, ear pain, hearing loss, sinus pressure and sore throat.   Eyes: Negative for blurred vision, photophobia and pain.  Respiratory: Negative for cough, sputum production, shortness of breath and wheezing.   Cardiovascular: Negative for chest pain, palpitations and leg swelling.  Gastrointestinal: Negative for abdominal pain, blood in stool, constipation, diarrhea, heartburn, melena and nausea.  Genitourinary: Negative for dysuria, frequency, hematuria and urgency.  Musculoskeletal: Positive for neck pain. Negative for back pain, joint pain and myalgias.  Skin: Negative for rash.  Neurological: Positive for headaches. Negative for dizziness, tingling, sensory change, focal weakness, weakness, numbness and loss of balance.  Endo/Heme/Allergies: Negative for environmental allergies and polydipsia. Does not bruise/bleed easily.  Psychiatric/Behavioral: Negative for depression and suicidal ideas. The patient is not nervous/anxious and does not have insomnia.      Objective  Vitals:   04/23/17 1033  BP: 138/64  Pulse: 88  Weight: 200 lb (90.7 kg)  Height: 5\' 6"  (1.676 m)    Physical Exam  Constitutional: He is oriented to person, place, and time and well-developed, well-nourished, and in no distress.  HENT:  Head: Normocephalic.  Right Ear: External ear normal.  Left Ear: External ear normal.  Nose:  Nose normal.  Mouth/Throat: Oropharynx is clear and moist.  Eyes: Conjunctivae and EOM are normal. Pupils are equal, round, and reactive to light. Right eye exhibits no discharge. Left eye exhibits no discharge. No scleral icterus.  Neck: Normal carotid pulses, no hepatojugular reflux and no JVD present. Muscular tenderness present. No spinous process tenderness present. Carotid bruit is not present. Neck rigidity present. Decreased range of motion present. No tracheal deviation present. No thyromegaly present.  Cardiovascular: Normal rate, regular rhythm, normal heart sounds and intact distal pulses. Exam reveals no gallop and no friction rub.  No murmur heard. Pulmonary/Chest: Breath sounds normal. No respiratory distress. He has no wheezes. He has no rales.  Abdominal: Soft. Bowel sounds are normal. He exhibits no mass. There is no hepatosplenomegaly. There is no tenderness. There is no rebound, no guarding and no CVA tenderness.  Musculoskeletal: He exhibits no edema.       Cervical back: He exhibits tenderness and spasm.  Lymphadenopathy:    He has no cervical adenopathy.  Neurological: He  is alert and oriented to person, place, and time. He has normal sensation, normal strength, normal reflexes and intact cranial nerves. No cranial nerve deficit.  Reflex Scores:      Bicep reflexes are 2+ on the right side and 2+ on the left side.      Brachioradialis reflexes are 2+ on the right side and 2+ on the left side. Skin: Skin is warm. No rash noted.  Psychiatric: Mood and affect normal.  Nursing note and vitals reviewed.     Assessment & Plan  Problem List Items Addressed This Visit    None    Visit Diagnoses    New daily persistent headache    -  Primary   sudden onset yesterday /neck pain today/ referral to er   Cervical spinal stenosis       C4-C7 fusion 2014/[facility]   Degenerative disc disease, cervical          No orders of the defined types were placed in this  encounter.     Dr. [facility]  04/23/17

## 2017-04-24 ENCOUNTER — Other Ambulatory Visit: Payer: Self-pay

## 2017-04-24 NOTE — Patient Outreach (Signed)
Outreach patient after ED visit.  Patient verified PCP and said that he saw him on yesterday.  He said that he does not have any transportation issues.  I explained [facility] services and 24 Hour Nurse Advice Line.  He made note of the number and agreed to complete the engagement tool.  Successful letter, know before you go and magnet mailed on 04/24/17.

## 2017-04-27 ENCOUNTER — Encounter: Payer: Self-pay | Admitting: Family Medicine

## 2017-04-27 ENCOUNTER — Ambulatory Visit (INDEPENDENT_AMBULATORY_CARE_PROVIDER_SITE_OTHER): Payer: Medicare Other | Admitting: Family Medicine

## 2017-04-27 ENCOUNTER — Other Ambulatory Visit: Payer: Self-pay

## 2017-04-27 DIAGNOSIS — I129 Hypertensive chronic kidney disease with stage 1 through stage 4 chronic kidney disease, or unspecified chronic kidney disease: Secondary | ICD-10-CM | POA: Diagnosis not present

## 2017-04-27 DIAGNOSIS — M25511 Pain in right shoulder: Secondary | ICD-10-CM | POA: Diagnosis not present

## 2017-04-27 DIAGNOSIS — M542 Cervicalgia: Secondary | ICD-10-CM | POA: Diagnosis not present

## 2017-04-27 DIAGNOSIS — Z981 Arthrodesis status: Secondary | ICD-10-CM | POA: Diagnosis not present

## 2017-04-27 DIAGNOSIS — E7800 Pure hypercholesterolemia, unspecified: Secondary | ICD-10-CM | POA: Diagnosis not present

## 2017-04-27 DIAGNOSIS — N189 Chronic kidney disease, unspecified: Secondary | ICD-10-CM | POA: Diagnosis not present

## 2017-04-27 DIAGNOSIS — R202 Paresthesia of skin: Secondary | ICD-10-CM | POA: Diagnosis not present

## 2017-04-27 DIAGNOSIS — R51 Headache: Secondary | ICD-10-CM | POA: Diagnosis not present

## 2017-04-27 DIAGNOSIS — M5090 Cervical disc disorder, unspecified, unspecified cervical region: Secondary | ICD-10-CM

## 2017-04-27 DIAGNOSIS — M5411 Radiculopathy, occipito-atlanto-axial region: Secondary | ICD-10-CM | POA: Diagnosis not present

## 2017-04-27 DIAGNOSIS — M5412 Radiculopathy, cervical region: Secondary | ICD-10-CM | POA: Diagnosis not present

## 2017-04-27 NOTE — Progress Notes (Signed)
Name: Matthew Humphrey   MRN: [id number]    DOB: 04/22/1945   Date:04/27/2017       Progress Note  Subjective  Chief Complaint  No chief complaint on file.   Neck Pain   This is a recurrent problem. The current episode started in the past 7 days (sunday). The problem occurs constantly. The problem has been gradually improving (with medication). Associated with: previous cervical concern. The pain is present in the right side. The quality of the pain is described as aching. The pain is at a severity of 4/10. The pain is moderate. Nothing aggravates the symptoms. Pertinent negatives include no chest pain, fever, headaches, leg pain, numbness, pain with swallowing, paresis, photophobia, syncope, tingling, trouble swallowing, visual change, weakness or weight loss. The treatment provided mild relief.    No problem-specific Assessment & Plan notes found for this encounter.   Past Medical History:  Diagnosis Date  . GERD (gastroesophageal reflux disease)   . Hyperlipidemia   . Hypertension     Past Surgical History:  Procedure Laterality Date  . CARDIAC SURGERY     bypass  . CERVICAL FUSION    . COLONOSCOPY  2011 ?    Family History  Problem Relation Age of Onset  . Hypertension Father     Social History   Socioeconomic History  . Marital status: Single    Spouse name: Not on file  . Number of children: Not on file  . Years of education: Not on file  . Highest education level: Not on file  Social Needs  . Financial resource strain: Not on file  . Food insecurity - worry: Not on file  . Food insecurity - inability: Not on file  . Transportation needs - medical: Not on file  . Transportation needs - non-medical: Not on file  Occupational History  . Not on file  Tobacco Use  . Smoking status: Former Research scientist (life sciences)  . Smokeless tobacco: Former Network engineer and Sexual Activity  . Alcohol use: No    Alcohol/week: 0.0 oz  . Drug use: No  . Sexual activity: No  Other Topics  Concern  . Not on file  Social History Narrative  . Not on file    Allergies  Allergen Reactions  . Sulfa Antibiotics Other (See Comments)    Outpatient Medications Prior to Visit  Medication Sig Dispense Refill  . aspirin EC 81 MG tablet Take 1 tablet (81 mg total) by mouth daily. 90 tablet 3  . atorvastatin (LIPITOR) 80 MG tablet Take 1 tablet (80 mg total) by mouth daily. 90 tablet 1  . famciclovir (FAMVIR) 500 MG tablet Take 1 tablet (500 mg total) by mouth 3 (three) times daily. 21 tablet 0  . famotidine (PEPCID) 20 MG tablet Take 1 tablet (20 mg total) by mouth daily. 7 tablet 0  . gabapentin (NEURONTIN) 300 MG capsule Take 1 capsule (300 mg total) by mouth at bedtime. 30 capsule 3  . lisinopril (PRINIVIL,ZESTRIL) 5 MG tablet Take 1 tablet (5 mg total) by mouth daily. 90 tablet 1  . metoprolol tartrate (LOPRESSOR) 25 MG tablet TAKE ONE-HALF TABLET BY  MOUTH TWO TIMES DAILY 90 tablet 1  . oxyCODONE-acetaminophen (PERCOCET/ROXICET) 5-325 MG tablet Take 1 tablet by mouth every 6 (six) hours as needed for severe pain. 30 tablet 0  . pantoprazole (PROTONIX) 40 MG tablet Take 1 tablet (40 mg total) by mouth daily. 90 tablet 1  . predniSONE (STERAPRED UNI-PAK 21 TAB) 10 MG (21) TBPK  tablet Take 6 pills on day one then decrease by 1 pill each day 21 tablet 0   No facility-administered medications prior to visit.     Review of Systems  Constitutional: Negative for chills, fever, malaise/fatigue and weight loss.  HENT: Negative for ear discharge, ear pain, sore throat and trouble swallowing.   Eyes: Negative for blurred vision and photophobia.  Respiratory: Negative for cough, sputum production, shortness of breath and wheezing.   Cardiovascular: Negative for chest pain, palpitations, leg swelling and syncope.  Gastrointestinal: Negative for abdominal pain, blood in stool, constipation, diarrhea, heartburn, melena and nausea.  Genitourinary: Negative for dysuria, frequency, hematuria  and urgency.  Musculoskeletal: Positive for neck pain. Negative for back pain, joint pain and myalgias.  Skin: Negative for rash.  Neurological: Negative for dizziness, tingling, sensory change, focal weakness, weakness, numbness and headaches.  Endo/Heme/Allergies: Negative for environmental allergies and polydipsia. Does not bruise/bleed easily.  Psychiatric/Behavioral: Negative for depression and suicidal ideas. The patient is not nervous/anxious and does not have insomnia.      Objective  There were no vitals filed for this visit.  Physical Exam  Constitutional: He is oriented to person, place, and time and well-developed, well-nourished, and in no distress.  HENT:  Head: Normocephalic.  Right Ear: External ear normal.  Left Ear: External ear normal.  Nose: Nose normal.  Mouth/Throat: Oropharynx is clear and moist.  Eyes: Conjunctivae and EOM are normal. Pupils are equal, round, and reactive to light. Right eye exhibits no discharge. Left eye exhibits no discharge. No scleral icterus.  Neck: Normal range of motion. Neck supple. No JVD present. No tracheal deviation present. No thyromegaly present.  Cardiovascular: Normal rate, regular rhythm, normal heart sounds and intact distal pulses. Exam reveals no gallop and no friction rub.  No murmur heard. Pulmonary/Chest: Breath sounds normal. No respiratory distress. He has no wheezes. He has no rales.  Abdominal: Soft. Bowel sounds are normal. He exhibits no mass. There is no hepatosplenomegaly. There is no tenderness. There is no rebound, no guarding and no CVA tenderness.  Musculoskeletal: Normal range of motion. He exhibits no edema or tenderness.  Lymphadenopathy:    He has no cervical adenopathy.  Neurological: He is alert and oriented to person, place, and time. He has normal sensation, normal strength, normal reflexes and intact cranial nerves. No cranial nerve deficit.  Skin: Skin is warm. No rash noted.  Psychiatric: Mood and  affect normal.  Nursing note and vitals reviewed.     Assessment & Plan  Problem List Items Addressed This Visit    None    Visit Diagnoses    Cervical disc disease    -  Primary   zoster still possible / will discuss with radiology       No orders of the defined types were placed in this encounter.     Dr. [facility]  04/27/17

## 2017-04-30 ENCOUNTER — Other Ambulatory Visit: Payer: Self-pay | Admitting: *Deleted

## 2017-04-30 NOTE — Patient Outreach (Signed)
[facility]) Care Management  04/30/2017  Matthew Humphrey 07-Mar-1946 [id number]  Telephone Screen   Referral Date: 04/24/17 Referral Source: [employer]HC/[facility] [facility] CM -ED Census Referral Reason:  Insurance: UHC Medicare, Medicaid  Outreach attempt #1 to patient. No answer. RN CM left HIPAA compliant message along with contact info.    Plan: RN CM will contact patient within one week.   [name], RN, BSN, MHA/MSL, [place] Telephonic Care Manager Coordinator [facility] Direct Phone: [phone] Cell Phone: [phone] Toll Free: [phone] Fax: [phone]

## 2017-05-01 ENCOUNTER — Ambulatory Visit (INDEPENDENT_AMBULATORY_CARE_PROVIDER_SITE_OTHER): Payer: Medicare Other | Admitting: Family Medicine

## 2017-05-01 ENCOUNTER — Encounter: Payer: Self-pay | Admitting: Family Medicine

## 2017-05-01 ENCOUNTER — Other Ambulatory Visit: Payer: Self-pay | Admitting: *Deleted

## 2017-05-01 ENCOUNTER — Ambulatory Visit: Payer: Self-pay | Admitting: *Deleted

## 2017-05-01 VITALS — BP 130/62 | HR 72 | Ht 66.0 in | Wt 207.0 lb

## 2017-05-01 DIAGNOSIS — M5412 Radiculopathy, cervical region: Secondary | ICD-10-CM | POA: Diagnosis not present

## 2017-05-01 DIAGNOSIS — M5020 Other cervical disc displacement, unspecified cervical region: Secondary | ICD-10-CM

## 2017-05-01 MED ORDER — TRAMADOL HCL 50 MG PO TABS: 50.0000 mg | ORAL_TABLET | Freq: Three times a day (TID) | ORAL | 0 refills | Status: DC | PRN

## 2017-05-01 NOTE — Progress Notes (Signed)
Name: Matthew Humphrey   MRN: [id number]    DOB: 30-Sep-1945   Date:05/01/2017       Progress Note  Subjective  Chief Complaint  Chief Complaint  Patient presents with  . Neck Pain    follow up to discuss what to do about neck pain    Neck Pain   This is a recurrent problem. The current episode started 1 to 4 weeks ago (10 days). The problem occurs constantly. The problem has been gradually worsening. Associated with: previous hx of surgery. The pain is present in the right side. The quality of the pain is described as aching. The pain is at a severity of 5/10 (on medication). The pain is moderate. The symptoms are aggravated by sneezing and coughing. The pain is same all the time. Pertinent negatives include no chest pain, fever, headaches, leg pain, pain with swallowing, paresis, photophobia, syncope, tingling, trouble swallowing, visual change, weakness or weight loss. He has tried oral narcotics and muscle relaxants (neurontin) for the symptoms. The treatment provided moderate relief.    No problem-specific Assessment & Plan notes found for this encounter.   Past Medical History:  Diagnosis Date  . GERD (gastroesophageal reflux disease)   . Hyperlipidemia   . Hypertension     Past Surgical History:  Procedure Laterality Date  . CARDIAC SURGERY     bypass  . CERVICAL FUSION    . COLONOSCOPY  2011 ?    Family History  Problem Relation Age of Onset  . Hypertension Father     Social History   Socioeconomic History  . Marital status: Single    Spouse name: Not on file  . Number of children: Not on file  . Years of education: Not on file  . Highest education level: Not on file  Social Needs  . Financial resource strain: Not on file  . Food insecurity - worry: Not on file  . Food insecurity - inability: Not on file  . Transportation needs - medical: Not on file  . Transportation needs - non-medical: Not on file  Occupational History  . Not on file  Tobacco Use  .  Smoking status: Former Research scientist (life sciences)  . Smokeless tobacco: Former Network engineer and Sexual Activity  . Alcohol use: No    Alcohol/week: 0.0 oz  . Drug use: No  . Sexual activity: No  Other Topics Concern  . Not on file  Social History Narrative  . Not on file    Allergies  Allergen Reactions  . Sulfa Antibiotics Other (See Comments)    Outpatient Medications Prior to Visit  Medication Sig Dispense Refill  . aspirin EC 81 MG tablet Take 1 tablet (81 mg total) by mouth daily. 90 tablet 3  . atorvastatin (LIPITOR) 80 MG tablet Take 1 tablet (80 mg total) by mouth daily. 90 tablet 1  . famotidine (PEPCID) 20 MG tablet Take 1 tablet (20 mg total) by mouth daily. 7 tablet 0  . gabapentin (NEURONTIN) 300 MG capsule Take 1 capsule (300 mg total) by mouth at bedtime. 30 capsule 3  . lisinopril (PRINIVIL,ZESTRIL) 5 MG tablet Take 1 tablet (5 mg total) by mouth daily. 90 tablet 1  . metoprolol tartrate (LOPRESSOR) 25 MG tablet TAKE ONE-HALF TABLET BY  MOUTH TWO TIMES DAILY 90 tablet 1  . pantoprazole (PROTONIX) 40 MG tablet Take 1 tablet (40 mg total) by mouth daily. 90 tablet 1  . famciclovir (FAMVIR) 500 MG tablet Take 1 tablet (500 mg total) by  mouth 3 (three) times daily. 21 tablet 0  . oxyCODONE-acetaminophen (PERCOCET/ROXICET) 5-325 MG tablet Take 1 tablet by mouth every 6 (six) hours as needed for severe pain. 30 tablet 0  . predniSONE (STERAPRED UNI-PAK 21 TAB) 10 MG (21) TBPK tablet Take 6 pills on day one then decrease by 1 pill each day 21 tablet 0   No facility-administered medications prior to visit.     Review of Systems  Constitutional: Negative for chills, fever, malaise/fatigue and weight loss.  HENT: Negative for ear discharge, ear pain, sore throat and trouble swallowing.   Eyes: Negative for blurred vision and photophobia.  Respiratory: Negative for cough, sputum production, shortness of breath and wheezing.   Cardiovascular: Negative for chest pain, palpitations, leg  swelling and syncope.  Gastrointestinal: Negative for abdominal pain, blood in stool, constipation, diarrhea, heartburn, melena and nausea.  Genitourinary: Negative for dysuria, frequency, hematuria and urgency.  Musculoskeletal: Positive for neck pain. Negative for back pain, joint pain and myalgias.  Skin: Negative for rash.  Neurological: Negative for dizziness, tingling, sensory change, focal weakness, weakness and headaches.  Endo/Heme/Allergies: Negative for environmental allergies and polydipsia. Does not bruise/bleed easily.  Psychiatric/Behavioral: Negative for depression and suicidal ideas. The patient is not nervous/anxious and does not have insomnia.      Objective  Vitals:   05/01/17 0906  BP: 130/62  Pulse: 72  Weight: 207 lb (93.9 kg)  Height: 5\' 6"  (1.676 m)    Physical Exam  Constitutional: He is oriented to person, place, and time and well-developed, well-nourished, and in no distress.  HENT:  Head: Normocephalic.  Right Ear: External ear normal.  Left Ear: External ear normal.  Nose: Nose normal.  Mouth/Throat: Oropharynx is clear and moist.  Eyes: Conjunctivae and EOM are normal. Pupils are equal, round, and reactive to light. Right eye exhibits no discharge. Left eye exhibits no discharge. No scleral icterus.  Neck: Normal range of motion. Neck supple. No JVD present. No tracheal deviation present. No thyromegaly present.  Cardiovascular: Normal rate, regular rhythm, normal heart sounds and intact distal pulses. Exam reveals no gallop and no friction rub.  No murmur heard. Pulmonary/Chest: Breath sounds normal. No respiratory distress. He has no wheezes. He has no rales.  Abdominal: Soft. Bowel sounds are normal. He exhibits no mass. There is no hepatosplenomegaly. There is no tenderness. There is no rebound, no guarding and no CVA tenderness.  Musculoskeletal: Normal range of motion. He exhibits no edema or tenderness.  Lymphadenopathy:    He has no cervical  adenopathy.  Neurological: He is alert and oriented to person, place, and time. He has normal sensation, normal strength, normal reflexes and intact cranial nerves. No cranial nerve deficit.  Skin: Skin is warm. No rash noted.  Psychiatric: Mood and affect normal.  Nursing note and vitals reviewed.     Assessment & Plan  Problem List Items Addressed This Visit    None    Visit Diagnoses    Cervical herniated disc    -  Primary   referral [facility]   Relevant Medications   traMADol (ULTRAM) 50 MG tablet   Cervical radiculopathy       Relevant Medications   traMADol (ULTRAM) 50 MG tablet      Meds ordered this encounter  Medications  . traMADol (ULTRAM) 50 MG tablet    Sig: Take 1 tablet (50 mg total) by mouth every 8 (eight) hours as needed.    Dispense:  30 tablet  Refill:  0      Dr. [facility]  05/01/17

## 2017-05-01 NOTE — Patient Outreach (Signed)
[facility]) Care Management  05/01/2017  Matthew Humphrey [birth year] [id number]  Telephone Screen   Referral Date: 04/24/17 Referral Source: [employer]HC/[facility]A [facility] CM -ED Census Referral Reason:  Insurance: UHC Medicare, Medicaid  Outgoing telephone call to patient and HIPAA verified. Patient reported, he was returning home from a visit with Dr. Ronnald Ramp today. Patient scheduled a visit with his primary MD due to having a sensation in his head that starts in his neck and travels to the right side of his head. He stated, his head feels light, fuzzy, and he has headaches. Patient stated, the MD diagnosed his with Shingles. Patient stated, he is not sure that he has Shingles, because he doesn't have a rash anywhere. He reported that he knows something is wrong. Patient has a follow-up appointment with his primary MD on 05/08/17. Per EMR, patient had 4 emergency room visits since September 2018. Patient has a history of Spinal Stenosis, CAD, CKD, CABG, Pulmonary Embolus, Ventricular Fibrillation, Hyperlipidemia, Hypertension, GERD, and Tubular Adenoma. Patient lives alone and his brother assist with his care when he is available. Patient verbalized having a Marketing executive. Patient reported his last B/P as 130/62, P-72, and weight at 207.  Plan: RN CM advised patient to contact RNCM for any needs or concerns RN CM will send referral to [facility] RN for further in home eval/assessment of care needs and management of chronic conditions.   [name], RN, BSN, MHA/MSL, [name] Telephonic Care Manager Coordinator [facility] Direct Phone: [phone] Cell Phone: [phone] Toll Free: [phone] Fax: [phone]

## 2017-05-04 ENCOUNTER — Other Ambulatory Visit: Payer: Self-pay | Admitting: *Deleted

## 2017-05-04 NOTE — Patient Outreach (Signed)
05/04/2017  [place]  1946-02-09   Successful telephone encounter to [facility], 72 year old male- follow up on referral  Received 05/01/17 from [facility] telephonic RN CM for Community CM services- Further in home evaluation/assesement of care needs with medical management.   Per referral - view in EMR pt had 4 ED visits since September 2018.  Pt's history includes But not limited to essential hypertension,Hyperlipidemia, Esophageal reflux, primary  Osteoarthritis.   Spoke with pt, HIPAA identifiers verified, discussed purpose of call for  Follow up on referral.  Pt reports on recent PCP visit (view in EMR neck pain), reports feels  Better today, taking all of his medications.   RN CM discussed with pt doing a home visit to which pt agreed.   Plan:  As discussed with pt, plan to follow up again next week- initial home visit.   Zara Chess.   [place] [facility]  [phone]

## 2017-05-08 ENCOUNTER — Ambulatory Visit (INDEPENDENT_AMBULATORY_CARE_PROVIDER_SITE_OTHER): Payer: Medicare Other | Admitting: Family Medicine

## 2017-05-08 ENCOUNTER — Encounter: Payer: Self-pay | Admitting: Family Medicine

## 2017-05-08 VITALS — BP 110/64 | HR 72 | Ht 66.0 in | Wt 193.0 lb

## 2017-05-08 DIAGNOSIS — M542 Cervicalgia: Secondary | ICD-10-CM | POA: Diagnosis not present

## 2017-05-08 DIAGNOSIS — M5412 Radiculopathy, cervical region: Secondary | ICD-10-CM | POA: Diagnosis not present

## 2017-05-08 MED ORDER — GABAPENTIN 300 MG PO CAPS: 300.0000 mg | ORAL_CAPSULE | Freq: Three times a day (TID) | ORAL | 3 refills | Status: DC

## 2017-05-08 NOTE — Progress Notes (Signed)
Name: Matthew Humphrey   MRN: [id number]    DOB: 1946-02-04   Date:05/08/2017       Progress Note  Subjective  Chief Complaint  Chief Complaint  Patient presents with  . Follow-up    headache is better but still has a nagging pain. Out of Tramadol    Neck Pain   This is a recurrent problem. The current episode started 1 to 4 weeks ago. The problem occurs constantly. The problem has been waxing and waning. The pain is associated with nothing. The pain is present in the right side. The quality of the pain is described as aching. The pain is at a severity of 8/10. The pain is severe. Nothing aggravates the symptoms. The pain is same all the time. Pertinent negatives include no chest pain, fever, headaches, leg pain, numbness, pain with swallowing, paresis, syncope, tingling, trouble swallowing, visual change, weakness or weight loss. He has tried NSAIDs for the symptoms. The treatment provided moderate relief.    No problem-specific Assessment & Plan notes found for this encounter.   Past Medical History:  Diagnosis Date  . GERD (gastroesophageal reflux disease)   . Hyperlipidemia   . Hypertension     Past Surgical History:  Procedure Laterality Date  . CARDIAC SURGERY     bypass  . CERVICAL FUSION    . COLONOSCOPY  2011 ?    Family History  Problem Relation Age of Onset  . Hypertension Father     Social History   Socioeconomic History  . Marital status: Single    Spouse name: Not on file  . Number of children: Not on file  . Years of education: Not on file  . Highest education level: Not on file  Social Needs  . Financial resource strain: Not on file  . Food insecurity - worry: Not on file  . Food insecurity - inability: Not on file  . Transportation needs - medical: Not on file  . Transportation needs - non-medical: Not on file  Occupational History  . Not on file  Tobacco Use  . Smoking status: Former Research scientist (life sciences)  . Smokeless tobacco: Former Network engineer and  Sexual Activity  . Alcohol use: No    Alcohol/week: 0.0 oz  . Drug use: No  . Sexual activity: No  Other Topics Concern  . Not on file  Social History Narrative  . Not on file    Allergies  Allergen Reactions  . Sulfa Antibiotics Other (See Comments)    Outpatient Medications Prior to Visit  Medication Sig Dispense Refill  . aspirin EC 81 MG tablet Take 1 tablet (81 mg total) by mouth daily. 90 tablet 3  . atorvastatin (LIPITOR) 80 MG tablet Take 1 tablet (80 mg total) by mouth daily. 90 tablet 1  . famotidine (PEPCID) 20 MG tablet Take 1 tablet (20 mg total) by mouth daily. 7 tablet 0  . lisinopril (PRINIVIL,ZESTRIL) 5 MG tablet Take 1 tablet (5 mg total) by mouth daily. 90 tablet 1  . metoprolol tartrate (LOPRESSOR) 25 MG tablet TAKE ONE-HALF TABLET BY  MOUTH TWO TIMES DAILY 90 tablet 1  . pantoprazole (PROTONIX) 40 MG tablet Take 1 tablet (40 mg total) by mouth daily. 90 tablet 1  . gabapentin (NEURONTIN) 300 MG capsule Take 1 capsule (300 mg total) by mouth at bedtime. 30 capsule 3  . traMADol (ULTRAM) 50 MG tablet Take 1 tablet (50 mg total) by mouth every 8 (eight) hours as needed. (Patient not taking: Reported  on 05/08/2017) 30 tablet 0   No facility-administered medications prior to visit.     Review of Systems  Constitutional: Negative for chills, fever, malaise/fatigue and weight loss.  HENT: Negative for ear discharge, ear pain, sore throat and trouble swallowing.   Eyes: Negative for blurred vision.  Respiratory: Negative for cough, sputum production, shortness of breath and wheezing.   Cardiovascular: Negative for chest pain, palpitations, leg swelling and syncope.  Gastrointestinal: Negative for abdominal pain, blood in stool, constipation, diarrhea, heartburn, melena and nausea.  Genitourinary: Negative for dysuria, frequency, hematuria and urgency.  Musculoskeletal: Positive for neck pain. Negative for back pain, joint pain and myalgias.  Skin: Negative for rash.   Neurological: Negative for dizziness, tingling, sensory change, focal weakness, weakness, numbness and headaches.  Endo/Heme/Allergies: Negative for environmental allergies and polydipsia. Does not bruise/bleed easily.  Psychiatric/Behavioral: Negative for depression and suicidal ideas. The patient is not nervous/anxious and does not have insomnia.      Objective  Vitals:   05/08/17 1018  BP: 110/64  Pulse: 72  Weight: 193 lb (87.5 kg)  Height: 5\' 6"  (1.676 m)    Physical Exam  Constitutional: He is oriented to person, place, and time and well-developed, well-nourished, and in no distress.  HENT:  Head: Normocephalic.  Right Ear: External ear normal.  Left Ear: External ear normal.  Nose: Nose normal.  Mouth/Throat: Oropharynx is clear and moist.  Eyes: Conjunctivae and EOM are normal. Pupils are equal, round, and reactive to light. Right eye exhibits no discharge. Left eye exhibits no discharge. No scleral icterus.  Neck: Normal range of motion. Neck supple. No JVD present. No tracheal deviation present. No thyromegaly present.  Cardiovascular: Normal rate, regular rhythm, normal heart sounds and intact distal pulses. Exam reveals no gallop and no friction rub.  No murmur heard. Pulmonary/Chest: Breath sounds normal. No respiratory distress. He has no wheezes. He has no rales.  Abdominal: Soft. Bowel sounds are normal. He exhibits no mass. There is no hepatosplenomegaly. There is no tenderness. There is no rebound, no guarding and no CVA tenderness.  Musculoskeletal: Normal range of motion. He exhibits no edema or tenderness.  Lymphadenopathy:    He has no cervical adenopathy.  Neurological: He is alert and oriented to person, place, and time. He has normal sensation, normal strength, normal reflexes and intact cranial nerves. No cranial nerve deficit.  Skin: Skin is warm. No rash noted.  Psychiatric: Mood and affect normal.  Nursing note and vitals  reviewed.     Assessment & Plan  Problem List Items Addressed This Visit    None    Visit Diagnoses    Cervical radiculopathy    -  Primary   Relevant Medications   gabapentin (NEURONTIN) 300 MG capsule   Other Relevant Orders   MR Cervical Spine Wo Contrast   Cervicalgia       Relevant Orders   MR Cervical Spine Wo Contrast      Meds ordered this encounter  Medications  . gabapentin (NEURONTIN) 300 MG capsule    Sig: Take 1 capsule (300 mg total) by mouth 3 (three) times daily.    Dispense:  90 capsule    Refill:  3      Dr. Otilio Miu [facility]  05/08/17

## 2017-05-11 ENCOUNTER — Ambulatory Visit: Payer: Self-pay | Admitting: *Deleted

## 2017-05-18 ENCOUNTER — Other Ambulatory Visit: Payer: Self-pay | Admitting: *Deleted

## 2017-05-18 ENCOUNTER — Encounter: Payer: Self-pay | Admitting: *Deleted

## 2017-05-18 NOTE — Patient Outreach (Addendum)
[facility]) Care Management   05/18/2017  [place] 08/28/1945 [id number]  Matthew Humphrey is an 72 y.o. male  Subjective:  Pt reports on recent visit with PCP- neck pain, feeling better  Today- no pain.   Pt reports neck pain started 3 weeks ago, just came up on Him.  Pt reports scheduled to have an MRI next week- ordered by PCP.   Objective:  Vitals:   05/18/17 1350  BP: 126/70  Pulse: 68  Resp: (!) 24  SpO2: 97%    ROS  Physical Exam  Constitutional: He is oriented to person, place, and time. He appears well-developed and well-nourished.  Cardiovascular: Normal rate and regular rhythm.  Respiratory: Effort normal.  GI: Soft.  Musculoskeletal: Normal range of motion. He exhibits no edema.  Neurological: He is alert and oriented to person, place, and time.  Skin: Skin is warm and dry.  Psychiatric: He has a normal mood and affect. His behavior is normal. Judgment and thought content normal.    Encounter Medications:   Outpatient Encounter Medications as of 05/18/2017  Medication Sig  . aspirin EC 81 MG tablet Take 1 tablet (81 mg total) by mouth daily.  Marland Kitchen atorvastatin (LIPITOR) 80 MG tablet Take 1 tablet (80 mg total) by mouth daily.  . famotidine (PEPCID) 20 MG tablet Take 1 tablet (20 mg total) by mouth daily.  Marland Kitchen gabapentin (NEURONTIN) 300 MG capsule Take 1 capsule (300 mg total) by mouth 3 (three) times daily.  Marland Kitchen lisinopril (PRINIVIL,ZESTRIL) 5 MG tablet Take 1 tablet (5 mg total) by mouth daily.  . metoprolol tartrate (LOPRESSOR) 25 MG tablet TAKE ONE-HALF TABLET BY  MOUTH TWO TIMES DAILY  . pantoprazole (PROTONIX) 40 MG tablet Take 1 tablet (40 mg total) by mouth daily.   No facility-administered encounter medications on file as of 05/18/2017.     Functional Status:   In your present state of health, do you have any difficulty performing the following activities: 05/18/2017  Hearing? N  Vision? N  Difficulty concentrating or making  decisions? N  Walking or climbing stairs? N  Dressing or bathing? N  Doing errands, shopping? N  Preparing Food and eating ? N  Using the Toilet? N  In the past six months, have you accidently leaked urine? Y  Do you have problems with loss of bowel control? N  Managing your Medications? N  Managing your Finances? N  Housekeeping or managing your Housekeeping? N  Some recent data might be hidden    Fall/Depression Screening:    Fall Risk  05/18/2017 08/22/2016 12/16/2014  Falls in the past year? No No No   PHQ 2/9 Scores 05/18/2017 05/01/2017 08/22/2016 12/16/2014  PHQ - 2 Score 0 0 0 0    Assessment:  Pleasant 72 year old male, lives alone.  Referral received from  [facility] telephonic RN CM for Community CM services- 4 ED visits since 12/2016. Pt's history includes but not limited to Hypertension, Hyperlipidemia, primary Osteoarthritis of both knees.      Issues with neck pain: currently no pain, to have MRI next week.    Hx of Hypertension:  BP today 126/70    Plan:  As discussed with pt, plan to continue to provide Community CM services Short term.              Barrier letter sent to Dr. Ronnald Ramp informing of [facility] involvement             Plan to send  Dr. Ronnald Ramp home visit encounter.    THN CM Care Plan Problem One     Most Recent Value  Care Plan Problem One  Chronic pain- neck   Role Documenting the Problem One  Care Management Coordinator  Care Plan for Problem One  Active  THN Long Term Goal   Pt would find source of neck pain in the next 40 days   THN Long Term Goal Start Date  05/18/17  Interventions for Problem One Long Term Goal  Initial home visit with assessment done.   THN CM Short Term Goal #1   Pt would take all medications as ordered for the next 30 days   THN CM Short Term Goal #1 Start Date  05/18/17  Interventions for Short Term Goal #1  informed pt recent PCP visit, MD increased his Neurontin to tid, been taking bid to which pt voiced understanding.   THN CM Short Term  Goal #2   Pt would keep all MD appointments in the next 30 days   THN CM Short Term Goal #2 Start Date  05/18/17  Interventions for Short Term Goal #2  Discussed with pt recent PCP visit, upcoming MRI ordered for next week.      Matthew Humphrey.   [place] [facility]  [phone]  05/24/17-  Addendum:  This RN CM discussed with pt during 05/18/17 home visit will follow up again    Telephonically next month- check on status.   Plan to follow pt  short term.   Matthew Humphrey.   [place] [facility]  [phone]

## 2017-05-19 ENCOUNTER — Encounter: Payer: Self-pay | Admitting: *Deleted

## 2017-05-22 ENCOUNTER — Ambulatory Visit
Admission: RE | Admit: 2017-05-22 | Discharge: 2017-05-22 | Disposition: A | Discharge status: Home / Self Care | Payer: Medicare Other | Source: Ambulatory Visit | Attending: Family Medicine | Admitting: Family Medicine

## 2017-05-22 DIAGNOSIS — M5412 Radiculopathy, cervical region: Secondary | ICD-10-CM | POA: Diagnosis not present

## 2017-05-22 DIAGNOSIS — M4802 Spinal stenosis, cervical region: Secondary | ICD-10-CM | POA: Diagnosis not present

## 2017-05-22 DIAGNOSIS — M542 Cervicalgia: Secondary | ICD-10-CM

## 2017-05-22 DIAGNOSIS — Z981 Arthrodesis status: Secondary | ICD-10-CM | POA: Insufficient documentation

## 2017-06-04 ENCOUNTER — Other Ambulatory Visit: Payer: Self-pay | Admitting: *Deleted

## 2017-06-04 NOTE — Patient Outreach (Signed)
06/04/2017   [facility] 72 [facility]- check on  Current clinical status, follow up on recent MRI.  Pt's history includes but not limited to essential hypertension, Hyperlipidemia, Esophageal reflux,  Primary osteoarthritis.  Spoke with pt, HIPAA identifiers verified.  Pt reports neck pain better, no stiffness, took all the medication for that.   Pt reports on recent MRI done, have not heard back yet on results.  Pt reports currently no  Pain, taking all his medications.  Pt reports BP okay, most recent one  126/70, check BP every 2 weeks.   RN CM discussed with pt calling PCP office to find results of MRI since per pt does not follow up again for 6 months- Pt agreed to do.      Plan:  As discussed with pt, plan to follow up again in 2 weeks telephonically- Check on status.    Zara Chess.   [place] [facility]  [phone]

## 2017-06-11 ENCOUNTER — Other Ambulatory Visit: Payer: Self-pay | Admitting: Family Medicine

## 2017-06-11 ENCOUNTER — Ambulatory Visit (INDEPENDENT_AMBULATORY_CARE_PROVIDER_SITE_OTHER): Payer: Medicare Other

## 2017-06-11 VITALS — BP 102/60 | HR 70 | Temp 98.1°F | Ht 66.0 in | Wt 198.6 lb

## 2017-06-11 DIAGNOSIS — Z1211 Encounter for screening for malignant neoplasm of colon: Secondary | ICD-10-CM | POA: Diagnosis not present

## 2017-06-11 DIAGNOSIS — Z0000 Encounter for general adult medical examination without abnormal findings: Secondary | ICD-10-CM | POA: Diagnosis not present

## 2017-06-11 DIAGNOSIS — Z114 Encounter for screening for human immunodeficiency virus [HIV]: Secondary | ICD-10-CM | POA: Diagnosis not present

## 2017-06-11 DIAGNOSIS — Z1159 Encounter for screening for other viral diseases: Secondary | ICD-10-CM | POA: Diagnosis not present

## 2017-06-11 NOTE — Progress Notes (Signed)
Subjective:   Matthew Humphrey is a 72 y.o. male who presents for an Initial Medicare Annual Wellness Visit.  Review of Systems  N/A Cardiac Risk Factors include: advanced age (>47men, >37 women);dyslipidemia;hypertension;male gender;obesity (BMI >30kg/m2);sedentary lifestyle    Objective:    Today's Vitals   06/11/17 1425  BP: 102/60  Pulse: 70  Temp: 98.1 F (36.7 C)  TempSrc: Oral  SpO2: 90%  Weight: 198 lb 9.6 oz (90.1 kg)  Height: 5\' 6"  (1.676 m)   Body mass index is 32.05 kg/m.  Advanced Directives 06/11/2017 05/18/2017 04/23/2017 12/27/2016 12/11/2016 12/16/2014  Does Patient Have a Medical Advance Directive? No No No No No No  Would patient like information on creating a medical advance directive? Yes (MAU/Ambulatory/Procedural Areas - Information given) (No Data) No - Patient declined - - No - patient declined information    Current Medications (verified) Outpatient Encounter Medications as of 06/11/2017  Medication Sig  . aspirin EC 81 MG tablet Take 1 tablet (81 mg total) by mouth daily.  Marland Kitchen atorvastatin (LIPITOR) 80 MG tablet Take 1 tablet (80 mg total) by mouth daily.  . famotidine (PEPCID) 20 MG tablet Take 1 tablet (20 mg total) by mouth daily.  Marland Kitchen gabapentin (NEURONTIN) 300 MG capsule Take 1 capsule (300 mg total) by mouth 3 (three) times daily.  Marland Kitchen lisinopril (PRINIVIL,ZESTRIL) 5 MG tablet Take 1 tablet (5 mg total) by mouth daily.  . metoprolol tartrate (LOPRESSOR) 25 MG tablet TAKE ONE-HALF TABLET BY  MOUTH TWO TIMES DAILY  . pantoprazole (PROTONIX) 40 MG tablet Take 1 tablet (40 mg total) by mouth daily.   No facility-administered encounter medications on file as of 06/11/2017.     Allergies (verified) Sulfa antibiotics   History: Past Medical History:  Diagnosis Date  . GERD (gastroesophageal reflux disease)   . Hyperlipidemia   . Hypertension    Past Surgical History:  Procedure Laterality Date  . CARDIAC SURGERY     bypass  . CERVICAL FUSION     . COLONOSCOPY  2011 ?   Family History  Problem Relation Age of Onset  . Hypertension Father   . Hypotension Mother    Social History   Socioeconomic History  . Marital status: Single    Spouse name: None  . Number of children: 3  . Years of education: None  . Highest education level: 9th grade  Social Needs  . Financial resource strain: Not hard at all  . Food insecurity - worry: Never true  . Food insecurity - inability: Never true  . Transportation needs - medical: No  . Transportation needs - non-medical: No  Occupational History  . Occupation: Retired  Tobacco Use  . Smoking status: Former Smoker    Packs/day: 1.50    Years: 30.00    Pack years: 45.00    Types: Cigarettes    Last attempt to quit: 1997    Years since quitting: 22.2  . Smokeless tobacco: Former Systems developer    Types: Chew  . Tobacco comment: smoking cessation materials not required  Substance and Sexual Activity  . Alcohol use: No    Alcohol/week: 0.0 oz  . Drug use: No  . Sexual activity: No  Other Topics Concern  . None  Social History Narrative  . None   Tobacco Counseling Counseling given: No Comment: smoking cessation materials not required   Clinical Intake:  Pre-visit preparation completed: Yes  Pain : No/denies pain     BMI - recorded: 32.05 Nutritional Risks:  None Diabetes: No  How often do you need to have someone help you when you read instructions, pamphlets, or other written materials from your doctor or pharmacy?: 1 - Never  Interpreter Needed?: No  Information entered by :: [place], LPN  Activities of Daily Living In your present state of health, do you have any difficulty performing the following activities: 06/11/2017 05/18/2017  Hearing? N N  Comment denies hearing aids -  Vision? N N  Comment wears eyeglasses -  Difficulty concentrating or making decisions? N N  Walking or climbing stairs? Y N  Comment dyspnea -  Dressing or bathing? N N  Doing errands,  shopping? N N  Preparing Food and eating ? N N  Comment denies dentures -  Using the Toilet? N N  In the past six months, have you accidently leaked urine? Y Y  Comment urgency -  Do you have problems with loss of bowel control? N N  Managing your Medications? N N  Managing your Finances? N N  Housekeeping or managing your Housekeeping? N N  Some recent data might be hidden     Immunizations and Health Maintenance Immunization History  Administered Date(s) Administered  . Influenza, High Dose Seasonal PF 12/12/2016  . Influenza,inj,Quad PF,6+ Mos 12/16/2014, 12/15/2015  . Influenza-Unspecified 12/22/2011, 01/15/2013  . Pneumococcal Conjugate-13 08/22/2016  . Pneumococcal Polysaccharide-23 12/22/2011, 12/16/2014   Health Maintenance Due  Topic Date Due  . Hepatitis C Screening  June 07, 1945  . COLONOSCOPY  07/03/1996    Patient Care Team: Juline Patch, MD as PCP - General (Family Medicine)  Indicate any recent Medical Services you may have received from other than [facility] providers in the past year (date may be approximate).    Assessment:   This is a routine wellness examination for [name].  Hearing/Vision screen Vision Screening Comments: Sees Dr. Wyatt Portela for annual eye exams  Dietary issues and exercise activities discussed: Current Exercise Habits: The patient does not participate in regular exercise at present, Exercise limited by: None identified  Goals    . DIET - INCREASE WATER INTAKE     Recommend to drink at least 6-8 8oz glasses of water per day.      Depression Screen PHQ 2/9 Scores 06/11/2017 05/18/2017 05/01/2017 08/22/2016  PHQ - 2 Score 0 0 0 0  PHQ- 9 Score 0 - - -    Fall Risk Fall Risk  06/11/2017 05/18/2017 08/22/2016 12/16/2014  Falls in the past year? No No No No  Risk for fall due to : Impaired vision - - -  Risk for fall due to: Comment wears eyeglasses - - -    Is the patient's home free of loose throw rugs in walkways, pet beds, electrical  cords, etc?   Yes Does the patient have any grab bars in the bathroom? No  Does the patient use a shower chair when bathing? Yes Does the patient have any stairs in or around the home? Yes If so, are there any handrails?  Yes Does the patient have adequate lighting?  Yes Does the patient use a cane, walker or w/c? Yes, cane Does the patient use of an elevated toilet seat? Yes  Timed Get Up and Go Performed: Yes. Pt ambulated 10 feet within 8 sec. Gait stead-fast and without the use of an assistive device. No intervention required at this time. Fall risk prevention has been discussed.  Pt declined my offer to send [employer] Referral to Care Guide for  installation of grab  bars in the shower.  Cognitive Function:     6CIT Screen 06/11/2017  What Year? 0 points  What month? 0 points  What time? 0 points  Count back from 20 0 points  Months in reverse 0 points  Repeat phrase 2 points  Total Score 2    Screening Tests Health Maintenance  Topic Date Due  . Hepatitis C Screening  [birth year]  . COLONOSCOPY  07/03/1996  . TETANUS/TDAP  06/12/2018 (Originally 07/03/1965)  . INFLUENZA VACCINE  Completed  . PNA vac Low Risk Adult  Completed    Qualifies for Shingles Vaccine? Yes. Due for Zostavax or Shingrix vaccine. Education has been provided regarding the importance of this vaccine. Pt has been advised to call his insurance company to determine his out of pocket expense. Advised he may also receive this vaccine at his local pharmacy or Health Dept. Verbalized acceptance and understanding.  Due for Tdap vaccine. Education has been provided regarding the importance of this vaccine, however, pt has been advised that this vaccine is NOT covered by Medicare as a preventative vaccination. He/she is welcome to receive this vacine today but will be responsible for any out of pocket expenses incurred as a result of receiving this vaccine. Pt has been advised he may receive this vaccine at  his local pharmacy or Health Dept. Also advised to provide a copy of his/her vaccination record if he chooses to receive this vaccine at his local pharmacy. Verbalized acceptance and understanding.  Cancer Screenings: Lung: Low Dose CT Chest recommended if Age 63-80 years, 30 pack-year currently smoking OR have quit w/in 15years. Patient does not qualify. Colorectal: Unable to locate a report to indicate this screening has been completed. Pt is also unable to recall if he has completed this screening. Pt requested to be referred to GI for screening colonoscopy. Referral sent to referral coordinator for scheduling purposes. Pt is aware that he will receive a call from our office re: his appt.  Additional Screenings: Hepatitis B/HIV/Syphillis: Ordered today Hepatitis C Screening: Ordered today     Plan:  I have personally reviewed and addressed the Medicare Annual Wellness questionnaire and have noted the following in the patient's chart:  A. Medical and social history B. Use of alcohol, tobacco or illicit drugs  C. Current medications and supplements D. Functional ability and status E.  Nutritional status F.  Physical activity G. Advance directives H. List of other physicians I.  Hospitalizations, surgeries, and ER visits in previous 12 months J.  [place] such as hearing and vision if needed, cognitive and depression L. Referrals and appointments - none  In addition, I have reviewed and discussed with patient certain preventive protocols, quality metrics, and best practice recommendations. A written personalized care plan for preventive services as well as general preventive health recommendations were provided to patient.  Signed,  Aleatha Borer, LPN Nurse Health Advisor  MD Recommendations: Due for Zostavax or Shingrix vaccine. Education has been provided regarding the importance of this vaccine. Pt has been advised to call his insurance company to determine his out of  pocket expense. Advised he may also receive this vaccine at his local pharmacy or Health Dept. Verbalized acceptance and understanding.  Due for Tdap vaccine. Education has been provided regarding the importance of this vaccine, however, pt has been advised that this vaccine is NOT covered by Medicare as a preventative vaccination. He/she is welcome to receive this vacine today but will be responsible for any out of pocket expenses  incurred as a result of receiving this vaccine. Pt has been advised he may receive this vaccine at his local pharmacy or Health Dept. Also advised to provide a copy of his/her vaccination record if he chooses to receive this vaccine at his local pharmacy. Verbalized acceptance and understanding.  Due for colorectal screening. Unable to locate a report to indicate this screening has been completed. Pt is also unable to recall if he has completed this screening. Pt requested to be referred to GI for screening colonoscopy. Referral sent to referral coordinator for scheduling purposes. Pt is aware that he will receive a call from our office re: his appt.  Due for HIV and Hep C screening. Both ordered today.

## 2017-06-11 NOTE — Patient Instructions (Signed)
Matthew Humphrey , Thank you for taking time to come for your Medicare Wellness Visit. I appreciate your ongoing commitment to your health goals. Please review the following plan we discussed and let me know if I can assist you in the future.   Screening recommendations/referrals: Colorectal Screening: You will receive a call from our office regarding your appointment. Lung Cancer Screening: You do not qualify for this screening Hepatitis C Screening: Ordered today HIV/Syphilis/Hepatitis B Screening: Ordered today   Vision/Dental Exams: Recommended yearly ophthalmology/optometry visit for glaucoma screening and checkup Recommended yearly dental visit for hygiene and checkup  Vaccinations: Influenza vaccine: Up to date Pneumococcal vaccine: Completed series Tdap vaccine: Declined. Please call your insurance company to determine your out of pocket expense. You may also receive this vaccine at your local pharmacy or Health Dept. Shingles vaccine: Please call your insurance company to determine your out of pocket expense for the Shingrix vaccine. You may also receive this vaccine at your local pharmacy or Health Dept.   Advanced directives: Advance directive discussed with you today. I have provided a copy for you to complete at home and have notarized. Once this is complete please bring a copy in to our office so we can scan it into your chart.  Conditions/risks identified: Recommend to drink at least 6-8 8oz glasses of water per day.  Next appointment: Please schedule your Annual Wellness Visit with your Nurse Health Advisor in one year.  Preventive Care 29 Years and Older, Male Preventive care refers to lifestyle choices and visits with your health care provider that can promote health and wellness. What does preventive care include?  A yearly physical exam. This is also called an annual well check.  Dental exams once or twice a year.  Routine eye exams. Ask your health care provider how  often you should have your eyes checked.  Personal lifestyle choices, including:  Daily care of your teeth and gums.  Regular physical activity.  Eating a healthy diet.  Avoiding tobacco and drug use.  Limiting alcohol use.  Practicing safe sex.  Taking low doses of aspirin every day.  Taking vitamin and mineral supplements as recommended by your health care provider. What happens during an annual well check? The services and screenings done by your health care provider during your annual well check will depend on your age, overall health, lifestyle risk factors, and family history of disease. Counseling  Your health care provider may ask you questions about your:  Alcohol use.  Tobacco use.  Drug use.  Emotional well-being.  Home and relationship well-being.  Sexual activity.  Eating habits.  History of falls.  Memory and ability to understand (cognition).  Work and work Statistician. Screening  You may have the following tests or measurements:  Height, weight, and BMI.  Blood pressure.  Lipid and cholesterol levels. These may be checked every 5 years, or more frequently if you are over 71 years old.  Skin check.  Lung cancer screening. You may have this screening every year starting at age 71 if you have a 30-pack-year history of smoking and currently smoke or have quit within the past 15 years.  Fecal occult blood test (FOBT) of the stool. You may have this test every year starting at age 71.  Flexible sigmoidoscopy or colonoscopy. You may have a sigmoidoscopy every 5 years or a colonoscopy every 10 years starting at age 71.  Prostate cancer screening. Recommendations will vary depending on your family history and other risks.  Hepatitis  C blood test.  Hepatitis B blood test.  Sexually transmitted disease (STD) testing.  Diabetes screening. This is done by checking your blood sugar (glucose) after you have not eaten for a while (fasting). You may  have this done every 1-3 years.  Abdominal aortic aneurysm (AAA) screening. You may need this if you are a current or former smoker.  Osteoporosis. You may be screened starting at age 71. if you are at high risk. Talk with your health care provider about your test results, treatment options, and if necessary, the need for more tests. Vaccines  Your health care provider may recommend certain vaccines, such as:  Influenza vaccine. This is recommended every year.  Tetanus, diphtheria, and acellular pertussis (Tdap, Td) vaccine. You may need a Td booster every 10 years.  Zoster vaccine. You may need this after age 71.  Pneumococcal 13-valent conjugate (PCV13) vaccine. One dose is recommended after age 71.  Pneumococcal polysaccharide (PPSV23) vaccine. One dose is recommended after age 71. Talk to your health care provider about which screenings and vaccines you need and how often you need them. This information is not intended to replace advice given to you by your health care provider. Make sure you discuss any questions you have with your health care provider. Document Released: 04/16/2015 Document Revised: 12/08/2015 Document Reviewed: 01/19/2015 Elsevier Interactive Patient Education  2017 [place] Prevention in the Home Falls can cause injuries. They can happen to people of all ages. There are many things you can do to make your home safe and to help prevent falls. What can I do on the outside of my home?  Regularly fix the edges of walkways and driveways and fix any cracks.  Remove anything that might make you trip as you walk through a door, such as a raised step or threshold.  Trim any bushes or trees on the path to your home.  Use bright outdoor lighting.  Clear any walking paths of anything that might make someone trip, such as rocks or tools.  Regularly check to see if handrails are loose or broken. Make sure that both sides of any steps have handrails.  Any  raised decks and porches should have guardrails on the edges.  Have any leaves, snow, or ice cleared regularly.  Use sand or salt on walking paths during winter.  Clean up any spills in your garage right away. This includes oil or grease spills. What can I do in the bathroom?  Use night lights.  Install grab bars by the toilet and in the tub and shower. Do not use towel bars as grab bars.  Use non-skid mats or decals in the tub or shower.  If you need to sit down in the shower, use a plastic, non-slip stool.  Keep the floor dry. Clean up any water that spills on the floor as soon as it happens.  Remove soap buildup in the tub or shower regularly.  Attach bath mats securely with double-sided non-slip rug tape.  Do not have throw rugs and other things on the floor that can make you trip. What can I do in the bedroom?  Use night lights.  Make sure that you have a light by your bed that is easy to reach.  Do not use any sheets or blankets that are too big for your bed. They should not hang down onto the floor.  Have a firm chair that has side arms. You can use this for support while you get  dressed.  Do not have throw rugs and other things on the floor that can make you trip. What can I do in the kitchen?  Clean up any spills right away.  Avoid walking on wet floors.  Keep items that you use a lot in easy-to-reach places.  If you need to reach something above you, use a strong step stool that has a grab bar.  Keep electrical cords out of the way.  Do not use floor polish or wax that makes floors slippery. If you must use wax, use non-skid floor wax.  Do not have throw rugs and other things on the floor that can make you trip. What can I do with my stairs?  Do not leave any items on the stairs.  Make sure that there are handrails on both sides of the stairs and use them. Fix handrails that are broken or loose. Make sure that handrails are as long as the  stairways.  Check any carpeting to make sure that it is firmly attached to the stairs. Fix any carpet that is loose or worn.  Avoid having throw rugs at the top or bottom of the stairs. If you do have throw rugs, attach them to the floor with carpet tape.  Make sure that you have a light switch at the top of the stairs and the bottom of the stairs. If you do not have them, ask someone to add them for you. What else can I do to help prevent falls?  Wear shoes that:  Do not have high heels.  Have rubber bottoms.  Are comfortable and fit you well.  Are closed at the toe. Do not wear sandals.  If you use a stepladder:  Make sure that it is fully opened. Do not climb a closed stepladder.  Make sure that both sides of the stepladder are locked into place.  Ask someone to hold it for you, if possible.  Clearly mark and make sure that you can see:  Any grab bars or handrails.  First and last steps.  Where the edge of each step is.  Use tools that help you move around (mobility aids) if they are needed. These include:  Canes.  Walkers.  Scooters.  Crutches.  Turn on the lights when you go into a dark area. Replace any light bulbs as soon as they burn out.  Set up your furniture so you have a clear path. Avoid moving your furniture around.  If any of your floors are uneven, fix them.  If there are any pets around you, be aware of where they are.  Review your medicines with your doctor. Some medicines can make you feel dizzy. This can increase your chance of falling. Ask your doctor what other things that you can do to help prevent falls. This information is not intended to replace advice given to you by your health care provider. Make sure you discuss any questions you have with your health care provider. Document Released: 01/14/2009 Document Revised: 08/26/2015 Document Reviewed: 04/24/2014 Elsevier Interactive Patient Education  2017 [employer].

## 2017-06-12 LAB — HIV ANTIBODY (ROUTINE TESTING W REFLEX): HIV Screen 4th Generation wRfx: NONREACTIVE

## 2017-06-18 ENCOUNTER — Encounter: Payer: Self-pay | Admitting: *Deleted

## 2017-06-18 ENCOUNTER — Other Ambulatory Visit: Payer: Self-pay | Admitting: *Deleted

## 2017-06-18 NOTE — Patient Outreach (Signed)
Successful telephone encounter to [facility], 72 year old male- follow up on current status.  Spoke with pt, HIPAA identifiers verified.  Pt  Reports on recent yearly checkup (Wellness visit), good report.   Pt reports has not heard back on MRI results (find cause of neck pain)  Currently no pain in neck or anywhere else.  Pt reports BP good, checks it twice a week, does not record results, taking all of his medications.  Pt reports no upcoming MD appointments.  RN CM discussed with pt plan to discharge from [facility] CM services, no further case  Management needs to which pt agreed, RN CM to let PCP know.    Pt verified has RN CM's contact information, [facility] main office number to call if needs arise in the future.   Pt also confirmed has 24 hour nurse line.  Plan:  As discussed with pt, plan to discharge from [facility] CM services.           Care plan completed.            Plan to inform  Dr. Ronnald Ramp of plan to discharge, send case closure letter.            Plan to inform [facility] CMA to close case.   Zara Chess.   [facility]  [phone]

## 2017-06-22 ENCOUNTER — Other Ambulatory Visit: Payer: Self-pay

## 2017-06-22 DIAGNOSIS — Z1211 Encounter for screening for malignant neoplasm of colon: Secondary | ICD-10-CM

## 2017-07-05 ENCOUNTER — Other Ambulatory Visit: Payer: Self-pay

## 2017-07-05 ENCOUNTER — Encounter: Payer: Self-pay | Admitting: *Deleted

## 2017-07-11 ENCOUNTER — Ambulatory Visit: Payer: Medicare Other | Admitting: Anesthesiology

## 2017-07-11 ENCOUNTER — Ambulatory Visit
Admission: RE | Admit: 2017-07-11 | Discharge: 2017-07-11 | Disposition: A | Discharge status: Home / Self Care | Payer: Medicare Other | Source: Ambulatory Visit | Attending: Gastroenterology | Admitting: Gastroenterology

## 2017-07-11 ENCOUNTER — Encounter
Admission: RE | Disposition: A | Discharge status: Home / Self Care | Payer: Self-pay | Source: Ambulatory Visit | Attending: Gastroenterology

## 2017-07-11 ENCOUNTER — Encounter: Payer: Self-pay | Admitting: Anesthesiology

## 2017-07-11 DIAGNOSIS — Z87891 Personal history of nicotine dependence: Secondary | ICD-10-CM | POA: Diagnosis not present

## 2017-07-11 DIAGNOSIS — D125 Benign neoplasm of sigmoid colon: Secondary | ICD-10-CM | POA: Diagnosis not present

## 2017-07-11 DIAGNOSIS — K5730 Diverticulosis of large intestine without perforation or abscess without bleeding: Secondary | ICD-10-CM | POA: Diagnosis not present

## 2017-07-11 DIAGNOSIS — Z79899 Other long term (current) drug therapy: Secondary | ICD-10-CM | POA: Diagnosis not present

## 2017-07-11 DIAGNOSIS — K648 Other hemorrhoids: Secondary | ICD-10-CM | POA: Diagnosis not present

## 2017-07-11 DIAGNOSIS — Z7982 Long term (current) use of aspirin: Secondary | ICD-10-CM | POA: Diagnosis not present

## 2017-07-11 DIAGNOSIS — I2510 Atherosclerotic heart disease of native coronary artery without angina pectoris: Secondary | ICD-10-CM | POA: Diagnosis not present

## 2017-07-11 DIAGNOSIS — E785 Hyperlipidemia, unspecified: Secondary | ICD-10-CM | POA: Diagnosis not present

## 2017-07-11 DIAGNOSIS — K635 Polyp of colon: Secondary | ICD-10-CM | POA: Diagnosis not present

## 2017-07-11 DIAGNOSIS — K219 Gastro-esophageal reflux disease without esophagitis: Secondary | ICD-10-CM | POA: Insufficient documentation

## 2017-07-11 DIAGNOSIS — Z981 Arthrodesis status: Secondary | ICD-10-CM | POA: Insufficient documentation

## 2017-07-11 DIAGNOSIS — K644 Residual hemorrhoidal skin tags: Secondary | ICD-10-CM | POA: Diagnosis not present

## 2017-07-11 DIAGNOSIS — M1710 Unilateral primary osteoarthritis, unspecified knee: Secondary | ICD-10-CM | POA: Insufficient documentation

## 2017-07-11 DIAGNOSIS — D124 Benign neoplasm of descending colon: Secondary | ICD-10-CM | POA: Insufficient documentation

## 2017-07-11 DIAGNOSIS — Z882 Allergy status to sulfonamides status: Secondary | ICD-10-CM | POA: Insufficient documentation

## 2017-07-11 DIAGNOSIS — I10 Essential (primary) hypertension: Secondary | ICD-10-CM | POA: Diagnosis not present

## 2017-07-11 DIAGNOSIS — I4940 Unspecified premature depolarization: Secondary | ICD-10-CM | POA: Diagnosis not present

## 2017-07-11 DIAGNOSIS — Z1211 Encounter for screening for malignant neoplasm of colon: Secondary | ICD-10-CM | POA: Diagnosis not present

## 2017-07-11 DIAGNOSIS — Z8249 Family history of ischemic heart disease and other diseases of the circulatory system: Secondary | ICD-10-CM | POA: Insufficient documentation

## 2017-07-11 HISTORY — PX: POLYPECTOMY: SHX5525

## 2017-07-11 HISTORY — DX: Unspecified osteoarthritis, unspecified site: M19.90

## 2017-07-11 HISTORY — PX: COLONOSCOPY WITH PROPOFOL: SHX5780

## 2017-07-11 SURGERY — COLONOSCOPY WITH PROPOFOL
Anesthesia: General

## 2017-07-11 MED ORDER — STERILE WATER FOR IRRIGATION IR SOLN
Status: DC | PRN
  Administered 2017-07-11: 09:00:00

## 2017-07-11 MED ORDER — PROPOFOL 10 MG/ML IV BOLUS
INTRAVENOUS | Status: DC | PRN
  Administered 2017-07-11: 10 mg via INTRAVENOUS
  Administered 2017-07-11: 40 mg via INTRAVENOUS
  Administered 2017-07-11: 60 mg via INTRAVENOUS
  Administered 2017-07-11: 40 mg via INTRAVENOUS
  Administered 2017-07-11: 10 mg via INTRAVENOUS
  Administered 2017-07-11: 40 mg via INTRAVENOUS

## 2017-07-11 MED ORDER — LIDOCAINE HCL (CARDIAC) 20 MG/ML IV SOLN
INTRAVENOUS | Status: DC | PRN
  Administered 2017-07-11: 20 mg via INTRAVENOUS

## 2017-07-11 MED ORDER — OXYCODONE HCL 5 MG/5ML PO SOLN: 5.0000 mg | Freq: Once | ORAL | Status: DC | PRN

## 2017-07-11 MED ORDER — SODIUM CHLORIDE 0.9 % IV SOLN: INTRAVENOUS | Status: DC

## 2017-07-11 MED ORDER — LACTATED RINGERS IV SOLN
10.0000 mL/h | INTRAVENOUS | Status: DC
  Administered 2017-07-11: 10 mL/h via INTRAVENOUS

## 2017-07-11 MED ORDER — OXYCODONE HCL 5 MG PO TABS: 5.0000 mg | ORAL_TABLET | Freq: Once | ORAL | Status: DC | PRN

## 2017-07-11 SURGICAL SUPPLY — 25 items
CANISTER SUCT 1200ML W/VALVE (MISCELLANEOUS) ×2 IMPLANT
CLIP HMST 235XBRD CATH ROT (MISCELLANEOUS) IMPLANT
CLIP RESOLUTION 360 11X235 (MISCELLANEOUS)
ELECT REM PT RETURN 9FT ADLT (ELECTROSURGICAL)
ELECTRODE REM PT RTRN 9FT ADLT (ELECTROSURGICAL) IMPLANT
FCP ESCP3.2XJMB 240X2.8X (MISCELLANEOUS)
FORCEPS BIOP RAD 4 LRG CAP 4 (CUTTING FORCEPS) ×2 IMPLANT
FORCEPS BIOP RJ4 240 W/NDL (MISCELLANEOUS)
FORCEPS ESCP3.2XJMB 240X2.8X (MISCELLANEOUS) IMPLANT
GOWN CVR UNV OPN BCK APRN NK (MISCELLANEOUS) ×2 IMPLANT
GOWN ISOL THUMB LOOP REG UNIV (MISCELLANEOUS) ×2
INJECTOR VARIJECT VIN23 (MISCELLANEOUS) IMPLANT
KIT DEFENDO VALVE AND CONN (KITS) IMPLANT
KIT ENDO PROCEDURE OLY (KITS) ×2 IMPLANT
MARKER SPOT ENDO TATTOO 5ML (MISCELLANEOUS) IMPLANT
PROBE APC STR FIRE (PROBE) IMPLANT
RETRIEVER NET ROTH 2.5X230 LF (MISCELLANEOUS) IMPLANT
SNARE COLD EXACTO (MISCELLANEOUS) ×2 IMPLANT
SNARE SHORT THROW 13M SML OVAL (MISCELLANEOUS) IMPLANT
SNARE SHORT THROW 30M LRG OVAL (MISCELLANEOUS) IMPLANT
SNARE SNG USE RND 15MM (INSTRUMENTS) IMPLANT
SPOT EX ENDOSCOPIC TATTOO (MISCELLANEOUS)
TRAP ETRAP POLY (MISCELLANEOUS) ×2 IMPLANT
VARIJECT INJECTOR VIN23 (MISCELLANEOUS)
WATER STERILE IRR 250ML POUR (IV SOLUTION) ×2 IMPLANT

## 2017-07-11 NOTE — Op Note (Signed)
[facility] Gastroenterology Patient Name: Matthew Humphrey Procedure Date: 07/11/2017 8:41 AM MRN: [id number] Account #: [id number] Date of Birth: 1945-08-28 Admit Type: Outpatient Age: 72 Room: [facility] OR ROOM 01 Gender: Male Note Status: Finalized Procedure:            Colonoscopy Indications:          Screening for colorectal malignant neoplasm Providers:            Lin Landsman MD, MD Referring MD:         Juline Patch, MD (Referring MD) Medicines:            Monitored Anesthesia Care Complications:        No immediate complications. Estimated blood loss: None. Procedure:            Pre-Anesthesia Assessment:                       - Prior to the procedure, a History and Physical was                        performed, and patient medications and allergies were                        reviewed. The patient is competent. The risks and                        benefits of the procedure and the sedation options and                        risks were discussed with the patient. All questions                        were answered and informed consent was obtained.                        Patient identification and proposed procedure were                        verified by the physician, the nurse, the                        anesthesiologist, the anesthetist and the technician in                        the pre-procedure area in the procedure room in the                        endoscopy suite. Mental Status Examination: alert and                        oriented. Airway Examination: normal oropharyngeal                        airway and neck mobility. Respiratory Examination:                        clear to auscultation. CV Examination: normal.                        Prophylactic Antibiotics: The patient does not require  prophylactic antibiotics. Prior Anticoagulants: The                        patient has taken aspirin, last dose was 4 days prior                     to procedure. ASA Grade Assessment: III - A patient                        with severe systemic disease. After reviewing the risks                        and benefits, the patient was deemed in satisfactory                        condition to undergo the procedure. The anesthesia plan                        was to use monitored anesthesia care (MAC). Immediately                        prior to administration of medications, the patient was                        re-assessed for adequacy to receive sedatives. The                        heart rate, respiratory rate, oxygen saturations, blood                        pressure, adequacy of pulmonary ventilation, and                        response to care were monitored throughout the                        procedure. The physical status of the patient was                        re-assessed after the procedure.                       After obtaining informed consent, the colonoscope was                        passed under direct vision. Throughout the procedure,                        the patient's blood pressure, pulse, and oxygen                        saturations were monitored continuously. The Olympus                        Colonoscope 190 [id number]) was introduced through the                        anus and advanced to the the cecum, identified by  appendiceal orifice and ileocecal valve. The                        colonoscopy was performed without difficulty. The                        patient tolerated the procedure well. The quality of                        the bowel preparation was evaluated using the BBPS                        [facility] Bowel Preparation Scale) with scores of: Right                        Colon = 3, Transverse Colon = 3 and Left Colon = 3                        (entire mucosa seen well with no residual staining,                        small fragments of stool or opaque liquid).  The total                        BBPS score equals 9. Findings:      The perianal exam findings include non-thrombosed external hemorrhoids       and a skin tag.      Two sessile polyps were found in the descending colon. The polyps were       diminutive in size. These polyps were removed with a cold biopsy       forceps. Resection and retrieval were complete.      A 5 mm polyp was found in the sigmoid colon. The polyp was sessile. The       polyp was removed with a cold snare. Resection and retrieval were       complete.      A few diverticula were found in the sigmoid colon. There was no evidence       of diverticular bleeding.      External and internal hemorrhoids were found during retroflexion. The       hemorrhoids were medium-sized.      The exam was otherwise without abnormality. Impression:           - Non-thrombosed external hemorrhoids and perianal skin                        tag found on perianal exam.                       - Two diminutive polyps in the descending colon,                        removed with a cold biopsy forceps. Resected and                        retrieved.                       - One 5 mm polyp in the sigmoid colon, removed with a  cold snare. Resected and retrieved.                       - Moderate diverticulosis in the sigmoid colon. There                        was no evidence of diverticular bleeding.                       - External and internal hemorrhoids.                       - The examination was otherwise normal. Recommendation:       - Discharge patient to home.                       - Resume previous diet today.                       - Continue present medications.                       - Await pathology results.                       - Repeat colonoscopy in 5 years for surveillance. Procedure Code(s):    --- Professional ---                       [id number], Colonoscopy, flexible; with removal of tumor(s),                         polyp(s), or other lesion(s) by snare technique                       45380, 27, Colonoscopy, flexible; with biopsy, single                        or multiple Diagnosis Code(s):    --- Professional ---                       K64.4, Residual hemorrhoidal skin tags                       Z12.11, Encounter for screening for malignant neoplasm                        of colon                       D12.4, Benign neoplasm of descending colon                       D12.5, Benign neoplasm of sigmoid colon                       K64.8, Other hemorrhoids                       K57.30, Diverticulosis of large intestine without                        perforation or abscess without bleeding CPT copyright 2017 American Medical Association. All rights reserved. The codes documented  in this report are preliminary and upon coder review may  be revised to meet current compliance requirements. Dr. Ulyess Mort Lin Landsman MD, MD 07/11/2017 9:19:47 AM This report has been signed electronically. Number of Addenda: 0 Note Initiated On: 07/11/2017 8:41 AM Scope Withdrawal Time: 0 hours 15 minutes 0 seconds  Total Procedure Duration: 0 hours 20 minutes 10 seconds       [facility]

## 2017-07-11 NOTE — H&P (Signed)
Matthew Darby, MD [address]  [place]  [place], [place] [address]  Main: [phone]  Fax: [phone] Pager: [phone]  Primary Care Physician:  Juline Patch, MD Primary Gastroenterologist:  Dr. Cephas Humphrey  Pre-Procedure History & Physical: HPI:  Matthew Humphrey is a 72 y.o. male is here for an colonoscopy.   Past Medical History:  Diagnosis Date  . Arthritis    knee  . GERD (gastroesophageal reflux disease)   . Hyperlipidemia   . Hypertension     Past Surgical History:  Procedure Laterality Date  . CARDIAC SURGERY  03/2010   bypass  . CERVICAL FUSION  12/11/2012   C4-5,C5-6,C6-7  . COLONOSCOPY  2011 ?    Prior to Admission medications   Medication Sig Start Date End Date Taking? Authorizing Provider  aspirin EC 81 MG tablet Take 1 tablet (81 mg total) by mouth daily. 08/22/16   Juline Patch, MD  atorvastatin (LIPITOR) 80 MG tablet Take 1 tablet (80 mg total) by mouth daily. 03/06/17   Juline Patch, MD  famotidine (PEPCID) 20 MG tablet Take 1 tablet (20 mg total) by mouth daily. 12/27/16 12/27/17  Loney Hering, MD  gabapentin (NEURONTIN) 300 MG capsule Take 1 capsule (300 mg total) by mouth 3 (three) times daily. 05/08/17   Juline Patch, MD  lisinopril (PRINIVIL,ZESTRIL) 5 MG tablet Take 1 tablet (5 mg total) by mouth daily. 03/06/17   Juline Patch, MD  metoprolol tartrate (LOPRESSOR) 25 MG tablet TAKE ONE-HALF TABLET BY  MOUTH TWO TIMES DAILY 03/06/17   Juline Patch, MD  pantoprazole (PROTONIX) 40 MG tablet Take 1 tablet (40 mg total) by mouth daily. 03/06/17   Juline Patch, MD    Allergies as of 06/22/2017 - Review Complete 06/11/2017  Allergen Reaction Noted  . Sulfa antibiotics Other (See Comments) 12/16/2014    Family History  Problem Relation Age of Onset  . Hypertension Father   . Hypotension Mother     Social History   Socioeconomic History  . Marital status: Single    Spouse name: Not on file  . Number of  children: 3  . Years of education: Not on file  . Highest education level: 9th grade  Occupational History  . Occupation: Retired  Scientific laboratory technician  . Financial resource strain: Not hard at all  . Food insecurity:    Worry: Never true    Inability: Never true  . Transportation needs:    Medical: No    Non-medical: No  Tobacco Use  . Smoking status: Former Smoker    Packs/day: 1.50    Years: 30.00    Pack years: 45.00    Types: Cigarettes    Last attempt to quit: 1997    Years since quitting: 22.2  . Smokeless tobacco: Former Systems developer    Types: Chew  . Tobacco comment: smoking cessation materials not required  Substance and Sexual Activity  . Alcohol use: No    Alcohol/week: 0.0 oz  . Drug use: No  . Sexual activity: Never  Lifestyle  . Physical activity:    Days per week: 0 days    Minutes per session: 0 min  . Stress: Not at all  Relationships  . Social connections:    Talks on phone: Patient refused    Gets together: Patient refused    Attends religious service: Patient refused    Active member of club or organization: Patient refused    Attends meetings  of clubs or organizations: Patient refused    Relationship status: Patient refused  . Intimate partner violence:    Fear of current or ex partner: No    Emotionally abused: No    Physically abused: No    Forced sexual activity: No  Other Topics Concern  . Not on file  Social History Narrative  . Not on file    Review of Systems: See HPI, otherwise negative ROS  Physical Exam: BP 115/68   Pulse 60   Temp 98.1 F (36.7 C) (Temporal)   Resp 16   Ht 5\' 6"  (1.676 m)   Wt 196 lb (88.9 kg)   SpO2 99%   BMI 31.64 kg/m  General:   Alert,  pleasant and cooperative in NAD Head:  Normocephalic and atraumatic. Neck:  Supple; no masses or thyromegaly. Lungs:  Clear throughout to auscultation.    Heart:  Regular rate and rhythm. Abdomen:  Soft, nontender and nondistended. Normal bowel sounds, without guarding, and  without rebound.   Neurologic:  Alert and  oriented x4;  grossly normal neurologically.  Impression/Plan: Matthew Humphrey is here for an colonoscopy to be performed for colon cancer screening  Risks, benefits, limitations, and alternatives regarding  colonoscopy have been reviewed with the patient.  Questions have been answered.  All parties agreeable.   Sherri Sear, MD  07/11/2017, 8:43 AM

## 2017-07-11 NOTE — Anesthesia Postprocedure Evaluation (Signed)
Anesthesia Post Note  Patient: Matthew Humphrey  Procedure(s) Performed: COLONOSCOPY WITH PROPOFOL (N/A ) POLYPECTOMY (N/A )  Patient location during evaluation: PACU Anesthesia Type: General Level of consciousness: awake and alert Pain management: pain level controlled Vital Signs Assessment: post-procedure vital signs reviewed and stable Respiratory status: spontaneous breathing, nonlabored ventilation, respiratory function stable and patient connected to nasal cannula oxygen Cardiovascular status: blood pressure returned to baseline and stable Postop Assessment: no apparent nausea or vomiting Anesthetic complications: no    Lorianna Spadaccini

## 2017-07-11 NOTE — Anesthesia Procedure Notes (Signed)
Procedure Name: MAC Performed by: Lind Guest, CRNA Pre-anesthesia Checklist: Patient identified, Emergency Drugs available, Suction available, Patient being monitored and Timeout performed Patient Re-evaluated:Patient Re-evaluated prior to induction Oxygen Delivery Method: Nasal cannula

## 2017-07-11 NOTE — Anesthesia Preprocedure Evaluation (Addendum)
Anesthesia Evaluation  Patient identified by MRN, date of birth, ID band  Reviewed: NPO status   History of Anesthesia Complications Negative for: history of anesthetic complications  Airway Mallampati: III  TM Distance: >3 FB Neck ROM: full   Comment: Trach scar Dental no notable dental hx. (+) Missing,    Pulmonary former smoker,  TRACH scar since 2011;   Pulmonary exam normal        Cardiovascular Exercise Tolerance: Good hypertension, + CAD and + CABG (2011)  Normal cardiovascular exam+ dysrhythmias (h/o vfib; had aicd, but then removed)   ekg: SINUS RHYTHM WITH PREMATURE ATRIAL BEATS OTHERWISE NORMAL ECG;   med stable: 06/2017: dr Ronnald Ramp;  echo: 2016: ef=55%;    Neuro/Psych negative neurological ROS  negative psych ROS   GI/Hepatic Neg liver ROS, GERD  Controlled,  Endo/Other  negative endocrine ROS  Renal/GU CRFRenal disease (ckd2)  negative genitourinary   Musculoskeletal  (+) Arthritis , CERVICAL FUSION  12/11/2012 C4-5,C5-6,C6-7;     Abdominal   Peds  Hematology negative hematology ROS (+)   Anesthesia Other Findings tiva  Reproductive/Obstetrics                            Anesthesia Physical Anesthesia Plan  ASA: III  Anesthesia Plan: General   Post-op Pain Management:    Induction:   PONV Risk Score and Plan:   Airway Management Planned:   Additional Equipment:   Intra-op Plan:   Post-operative Plan:   Informed Consent: I have reviewed the patients History and Physical, chart, labs and discussed the procedure including the risks, benefits and alternatives for the proposed anesthesia with the patient or authorized representative who has indicated his/her understanding and acceptance.     Plan Discussed with: CRNA  Anesthesia Plan Comments:        Anesthesia Quick Evaluation

## 2017-07-11 NOTE — Transfer of Care (Signed)
Immediate Anesthesia Transfer of Care Note  Patient: Matthew Humphrey  Procedure(s) Performed: COLONOSCOPY WITH PROPOFOL (N/A ) POLYPECTOMY (N/A )  Patient Location: PACU  Anesthesia Type: General  Level of Consciousness: awake, alert  and patient cooperative  Airway and Oxygen Therapy: Patient Spontanous Breathing and Patient connected to supplemental oxygen  Post-op Assessment: Post-op Vital signs reviewed, Patient's Cardiovascular Status Stable, Respiratory Function Stable, Patent Airway and No signs of Nausea or vomiting  Post-op Vital Signs: Reviewed and stable  Complications: No apparent anesthesia complications

## 2017-07-12 ENCOUNTER — Encounter: Payer: Self-pay | Admitting: Gastroenterology

## 2017-07-13 ENCOUNTER — Encounter: Payer: Self-pay | Admitting: Gastroenterology

## 2017-07-23 ENCOUNTER — Encounter: Payer: Self-pay | Admitting: Gastroenterology

## 2017-07-27 ENCOUNTER — Telehealth: Payer: Self-pay | Admitting: Gastroenterology

## 2017-07-30 ENCOUNTER — Other Ambulatory Visit: Payer: Self-pay | Admitting: Family Medicine

## 2017-07-30 ENCOUNTER — Encounter: Payer: Self-pay | Admitting: Gastroenterology

## 2017-07-30 DIAGNOSIS — E785 Hyperlipidemia, unspecified: Secondary | ICD-10-CM

## 2017-07-30 DIAGNOSIS — K219 Gastro-esophageal reflux disease without esophagitis: Secondary | ICD-10-CM

## 2017-07-30 DIAGNOSIS — I10 Essential (primary) hypertension: Secondary | ICD-10-CM

## 2017-07-30 NOTE — Telephone Encounter (Signed)
error 

## 2017-09-04 ENCOUNTER — Encounter: Payer: Self-pay | Admitting: Family Medicine

## 2017-09-04 ENCOUNTER — Ambulatory Visit (INDEPENDENT_AMBULATORY_CARE_PROVIDER_SITE_OTHER): Payer: Medicare Other | Admitting: Family Medicine

## 2017-09-04 VITALS — BP 120/70 | HR 76 | Ht 66.0 in | Wt 197.0 lb

## 2017-09-04 DIAGNOSIS — I10 Essential (primary) hypertension: Secondary | ICD-10-CM

## 2017-09-04 DIAGNOSIS — M5090 Cervical disc disorder, unspecified, unspecified cervical region: Secondary | ICD-10-CM

## 2017-09-04 DIAGNOSIS — K219 Gastro-esophageal reflux disease without esophagitis: Secondary | ICD-10-CM | POA: Diagnosis not present

## 2017-09-04 DIAGNOSIS — I25810 Atherosclerosis of coronary artery bypass graft(s) without angina pectoris: Secondary | ICD-10-CM | POA: Diagnosis not present

## 2017-09-04 DIAGNOSIS — E785 Hyperlipidemia, unspecified: Secondary | ICD-10-CM | POA: Diagnosis not present

## 2017-09-04 MED ORDER — PANTOPRAZOLE SODIUM 40 MG PO TBEC: 40.0000 mg | DELAYED_RELEASE_TABLET | Freq: Every day | ORAL | 1 refills | Status: DC

## 2017-09-04 MED ORDER — ATORVASTATIN CALCIUM 80 MG PO TABS: 80.0000 mg | ORAL_TABLET | Freq: Every day | ORAL | 1 refills | Status: DC

## 2017-09-04 MED ORDER — ASPIRIN EC 81 MG PO TBEC: 81.0000 mg | DELAYED_RELEASE_TABLET | Freq: Every day | ORAL | 3 refills | Status: DC

## 2017-09-04 MED ORDER — LISINOPRIL 5 MG PO TABS: 5.0000 mg | ORAL_TABLET | Freq: Every day | ORAL | 1 refills | Status: DC

## 2017-09-04 MED ORDER — METOPROLOL TARTRATE 25 MG PO TABS: ORAL_TABLET | ORAL | 1 refills | Status: DC

## 2017-09-04 NOTE — Assessment & Plan Note (Signed)
Controlled on metoprolol 25 mg  One half tablet bid and lisinopril 5 mg q day. Will recheck renal panel.

## 2017-09-04 NOTE — Assessment & Plan Note (Signed)
Stable with control on otc medication as needed

## 2017-09-04 NOTE — Assessment & Plan Note (Signed)
Stable single vessel bypass graft 2011 [facility]. Will continue metoprolol and lisinopril a d as 81 mg.

## 2017-09-04 NOTE — Assessment & Plan Note (Signed)
Controlled with atorvastatin 80 mg q day. Will recheck lipid panel.

## 2017-09-04 NOTE — Progress Notes (Signed)
Name: Matthew Humphrey   MRN: [id number]    DOB: [birth year]   Date:09/04/2017       Progress Note  Subjective  Chief Complaint  Chief Complaint  Patient presents with  . Gastroesophageal Reflux  . Hyperlipidemia  . Hypertension    Gastroesophageal Reflux  He reports no abdominal pain, no belching, no chest pain, no choking, no coughing, no dysphagia, no early satiety, no globus sensation, no heartburn, no hoarse voice, no nausea, no sore throat, no stridor or no wheezing. This is a chronic problem. The current episode started more than 1 year ago. The problem occurs rarely. The problem has been unchanged. Nothing aggravates the symptoms. Pertinent negatives include no anemia, fatigue, melena, muscle weakness, orthopnea or weight loss. He has tried a PPI for the symptoms. The treatment provided moderate relief. Past procedures do not include an abdominal ultrasound, an EGD, esophageal manometry, esophageal pH monitoring, H. pylori antibody titer or a UGI.  Hyperlipidemia  This is a chronic problem. The current episode started more than 1 year ago. The problem is controlled. Recent lipid tests were reviewed and are normal. He has no history of chronic renal disease, diabetes, hypothyroidism, liver disease, obesity or nephrotic syndrome. There are no known factors aggravating his hyperlipidemia. Pertinent negatives include no chest pain, focal sensory loss, focal weakness, leg pain, myalgias or shortness of breath. He is currently on no antihyperlipidemic treatment. The current treatment provides significant improvement of lipids. There are no compliance problems.  There are no known risk factors for coronary artery disease.  Hypertension  This is a chronic problem. The current episode started more than 1 year ago. The problem is unchanged. The problem is controlled. Pertinent negatives include no anxiety, blurred vision, chest pain, headaches, malaise/fatigue, neck pain, orthopnea, palpitations,  peripheral edema, PND, shortness of breath or sweats. There are no associated agents to hypertension. Risk factors for coronary artery disease include post-menopausal state, male gender and dyslipidemia. Past treatments include beta blockers and ACE inhibitors. The current treatment provides moderate improvement. There are no compliance problems.  Hypertensive end-organ damage includes CAD/MI. There is no history of angina, kidney disease, CVA, heart failure, left ventricular hypertrophy, PVD or retinopathy. There is no history of chronic renal disease, a hypertension causing med or renovascular disease.  Heart Problem  This is a chronic (Bypass surgery 2011) problem. The current episode started more than 1 year ago. The problem has been unchanged. Pertinent negatives include no abdominal pain, anorexia, chest pain, chills, coughing, fatigue, fever, headaches, myalgias, nausea, neck pain, rash, sore throat or swollen glands. Nothing aggravates the symptoms. He has tried nothing for the symptoms. The treatment provided moderate relief.    Essential hypertension Controlled on metoprolol 25 mg  One half tablet bid and lisinopril 5 mg q day. Will recheck renal panel.  Esophageal reflux Controlled on pantoprazole 40 mg q day  Hyperlipidemia Controlled with atorvastatin 80 mg q day. Will recheck lipid panel.  Coronary artery disease involving autologous vein coronary bypass graft without angina pectoris Stable single vessel bypass graft 2011 [facility]. Will continue metoprolol and lisinopril a d as 81 mg.  Cervical disc disease Stable with control on otc medication as needed   Past Medical History:  Diagnosis Date  . Arthritis    knee  . GERD (gastroesophageal reflux disease)   . Hyperlipidemia   . Hypertension     Past Surgical History:  Procedure Laterality Date  . CARDIAC SURGERY  03/2010   bypass  .  CERVICAL FUSION  12/11/2012   C4-5,C5-6,C6-7  . COLONOSCOPY  2011 ?  . COLONOSCOPY  WITH PROPOFOL N/A 07/11/2017   Procedure: COLONOSCOPY WITH PROPOFOL;  Surgeon: Lin Landsman, MD;  Location: [place];  Service: Endoscopy;  Laterality: N/A;  . POLYPECTOMY N/A 07/11/2017   Procedure: POLYPECTOMY;  Surgeon: Lin Landsman, MD;  Location: [place];  Service: Endoscopy;  Laterality: N/A;    Family History  Problem Relation Age of Onset  . Hypertension Father   . Hypotension Mother     Social History   Socioeconomic History  . Marital status: Single    Spouse name: Not on file  . Number of children: 3  . Years of education: Not on file  . Highest education level: 9th grade  Occupational History  . Occupation: Retired  Scientific laboratory technician  . Financial resource strain: Not hard at all  . Food insecurity:    Worry: Never true    Inability: Never true  . Transportation needs:    Medical: No    Non-medical: No  Tobacco Use  . Smoking status: Former Smoker    Packs/day: 1.50    Years: 30.00    Pack years: 45.00    Types: Cigarettes    Last attempt to quit: 1997    Years since quitting: 22.4  . Smokeless tobacco: Former Systems developer    Types: Chew  . Tobacco comment: smoking cessation materials not required  Substance and Sexual Activity  . Alcohol use: No    Alcohol/week: 0.0 oz  . Drug use: No  . Sexual activity: Never  Lifestyle  . Physical activity:    Days per week: 0 days    Minutes per session: 0 min  . Stress: Not at all  Relationships  . Social connections:    Talks on phone: Patient refused    Gets together: Patient refused    Attends religious service: Patient refused    Active member of club or organization: Patient refused    Attends meetings of clubs or organizations: Patient refused    Relationship status: Patient refused  . Intimate partner violence:    Fear of current or ex partner: No    Emotionally abused: No    Physically abused: No    Forced sexual activity: No  Other Topics Concern  . Not on file  Social  History Narrative  . Not on file    Allergies  Allergen Reactions  . Sulfa Antibiotics Other (See Comments)    Outpatient Medications Prior to Visit  Medication Sig Dispense Refill  . aspirin EC 81 MG tablet Take 1 tablet (81 mg total) by mouth daily. 90 tablet 3  . atorvastatin (LIPITOR) 80 MG tablet TAKE 1 TABLET BY MOUTH  DAILY 90 tablet 1  . famotidine (PEPCID) 20 MG tablet Take 1 tablet (20 mg total) by mouth daily. 7 tablet 0  . lisinopril (PRINIVIL,ZESTRIL) 5 MG tablet TAKE 1 TABLET BY MOUTH  DAILY 90 tablet 1  . metoprolol tartrate (LOPRESSOR) 25 MG tablet TAKE ONE-HALF TABLET BY  MOUTH TWICE A DAY 90 tablet 1  . pantoprazole (PROTONIX) 40 MG tablet TAKE 1 TABLET BY MOUTH  DAILY 90 tablet 1  . gabapentin (NEURONTIN) 300 MG capsule Take 1 capsule (300 mg total) by mouth 3 (three) times daily. (Patient not taking: Reported on 07/11/2017) 90 capsule 3   No facility-administered medications prior to visit.     Review of Systems  Constitutional: Negative for chills, fatigue, fever, malaise/fatigue  and weight loss.  HENT: Negative for ear discharge, ear pain, hoarse voice and sore throat.   Eyes: Negative for blurred vision.  Respiratory: Negative for cough, sputum production, choking, shortness of breath and wheezing.   Cardiovascular: Negative for chest pain, palpitations, orthopnea, leg swelling and PND.  Gastrointestinal: Negative for abdominal pain, anorexia, blood in stool, constipation, diarrhea, dysphagia, heartburn, melena and nausea.  Genitourinary: Negative for dysuria, frequency, hematuria and urgency.  Musculoskeletal: Negative for back pain, joint pain, myalgias, muscle weakness and neck pain.  Skin: Negative for rash.  Neurological: Negative for dizziness, tingling, sensory change, focal weakness and headaches.  Endo/Heme/Allergies: Negative for environmental allergies and polydipsia. Does not bruise/bleed easily.  Psychiatric/Behavioral: Negative for depression and  suicidal ideas. The patient is not nervous/anxious and does not have insomnia.      Objective  Vitals:   09/04/17 0924  BP: 120/70  Pulse: 76  Weight: 197 lb (89.4 kg)  Height: 5\' 6"  (1.676 m)    Physical Exam  Constitutional: He is oriented to person, place, and time.  HENT:  Head: Normocephalic.  Right Ear: External ear normal.  Left Ear: External ear normal.  Nose: Nose normal.  Mouth/Throat: Oropharynx is clear and moist.  Eyes: Pupils are equal, round, and reactive to light. Conjunctivae and EOM are normal. Right eye exhibits no discharge. Left eye exhibits no discharge. No scleral icterus.  Neck: Normal range of motion. Neck supple. No JVD present. No tracheal deviation present. No thyromegaly present.  Cardiovascular: Normal rate, regular rhythm, normal heart sounds and intact distal pulses. Exam reveals no gallop and no friction rub.  No murmur heard. Pulmonary/Chest: Breath sounds normal. No respiratory distress. He has no wheezes. He has no rales.  Abdominal: Soft. Bowel sounds are normal. He exhibits no mass. There is no hepatosplenomegaly. There is no tenderness. There is no rebound, no guarding and no CVA tenderness.  Musculoskeletal: Normal range of motion. He exhibits no edema or tenderness.  Lymphadenopathy:    He has no cervical adenopathy.  Neurological: He is alert and oriented to person, place, and time. He has normal strength and normal reflexes. No cranial nerve deficit.  Skin: Skin is warm. No rash noted.  Nursing note and vitals reviewed.     Assessment & Plan  Problem List Items Addressed This Visit      Cardiovascular and Mediastinum   Essential hypertension - Primary    Controlled on metoprolol 25 mg  One half tablet bid and lisinopril 5 mg q day. Will recheck renal panel.      Relevant Medications   metoprolol tartrate (LOPRESSOR) 25 MG tablet   lisinopril (PRINIVIL,ZESTRIL) 5 MG tablet   atorvastatin (LIPITOR) 80 MG tablet   aspirin EC  81 MG tablet   Other Relevant Orders   Renal Function Panel   Lipid panel   Coronary artery disease involving autologous vein coronary bypass graft without angina pectoris    Stable single vessel bypass graft 2011 [facility]. Will continue metoprolol and lisinopril a d as 81 mg.      Relevant Medications   metoprolol tartrate (LOPRESSOR) 25 MG tablet   lisinopril (PRINIVIL,ZESTRIL) 5 MG tablet   atorvastatin (LIPITOR) 80 MG tablet   aspirin EC 81 MG tablet   Other Relevant Orders   Lipid panel     Digestive   Esophageal reflux    Controlled on pantoprazole 40 mg q day      Relevant Medications   pantoprazole (PROTONIX) 40 MG tablet  Musculoskeletal and Integument   Cervical disc disease    Stable with control on otc medication as needed        Other   Hyperlipidemia    Controlled with atorvastatin 80 mg q day. Will recheck lipid panel.      Relevant Medications   metoprolol tartrate (LOPRESSOR) 25 MG tablet   lisinopril (PRINIVIL,ZESTRIL) 5 MG tablet   atorvastatin (LIPITOR) 80 MG tablet   aspirin EC 81 MG tablet      Meds ordered this encounter  Medications  . metoprolol tartrate (LOPRESSOR) 25 MG tablet    Sig: TAKE ONE-HALF TABLET BY  MOUTH TWICE A DAY    Dispense:  90 tablet    Refill:  1  . lisinopril (PRINIVIL,ZESTRIL) 5 MG tablet    Sig: Take 1 tablet (5 mg total) by mouth daily.    Dispense:  90 tablet    Refill:  1  . pantoprazole (PROTONIX) 40 MG tablet    Sig: Take 1 tablet (40 mg total) by mouth daily.    Dispense:  90 tablet    Refill:  1  . atorvastatin (LIPITOR) 80 MG tablet    Sig: Take 1 tablet (80 mg total) by mouth daily.    Dispense:  90 tablet    Refill:  1  . aspirin EC 81 MG tablet    Sig: Take 1 tablet (81 mg total) by mouth daily.    Dispense:  90 tablet    Refill:  3      Dr. Otilio Miu [facility]  09/04/17

## 2017-09-04 NOTE — Assessment & Plan Note (Signed)
Controlled on pantoprazole 40 mg q day

## 2017-09-05 LAB — RENAL FUNCTION PANEL
ALBUMIN: 4.2 g/dL (ref 3.5–4.8)
BUN/Creatinine Ratio: 13 (ref 10–24)
BUN: 20 mg/dL (ref 8–27)
CALCIUM: 10 mg/dL (ref 8.6–10.2)
CO2: 23 mmol/L (ref 20–29)
CREATININE: 1.56 mg/dL — AB (ref 0.76–1.27)
Chloride: 108 mmol/L — ABNORMAL HIGH (ref 96–106)
GFR calc Af Amer: 51 mL/min/{1.73_m2} — ABNORMAL LOW (ref >59)
GFR calc non Af Amer: 44 mL/min/{1.73_m2} — ABNORMAL LOW (ref >59)
Glucose: 93 mg/dL (ref 65–99)
PHOSPHORUS: 2.7 mg/dL (ref 2.5–4.5)
Potassium: 4.9 mmol/L (ref 3.5–5.2)
Sodium: 144 mmol/L (ref 134–144)

## 2017-09-05 LAB — LIPID PANEL
CHOLESTEROL TOTAL: 129 mg/dL (ref 100–199)
Chol/HDL Ratio: 3.5 ratio (ref 0.0–5.0)
HDL: 37 mg/dL — ABNORMAL LOW (ref >39)
LDL CALC: 79 mg/dL (ref 0–99)
TRIGLYCERIDES: 66 mg/dL (ref 0–149)
VLDL CHOLESTEROL CAL: 13 mg/dL (ref 5–40)

## 2018-01-28 ENCOUNTER — Other Ambulatory Visit: Payer: Self-pay | Admitting: Family Medicine

## 2018-01-28 DIAGNOSIS — K219 Gastro-esophageal reflux disease without esophagitis: Secondary | ICD-10-CM

## 2018-01-28 DIAGNOSIS — E785 Hyperlipidemia, unspecified: Secondary | ICD-10-CM

## 2018-01-28 DIAGNOSIS — I10 Essential (primary) hypertension: Secondary | ICD-10-CM

## 2018-01-29 ENCOUNTER — Ambulatory Visit (INDEPENDENT_AMBULATORY_CARE_PROVIDER_SITE_OTHER): Payer: Medicare Other | Admitting: Family Medicine

## 2018-01-29 ENCOUNTER — Encounter: Payer: Self-pay | Admitting: Family Medicine

## 2018-01-29 VITALS — BP 118/60 | HR 68 | Ht 66.0 in | Wt 190.0 lb

## 2018-01-29 DIAGNOSIS — M25562 Pain in left knee: Secondary | ICD-10-CM

## 2018-01-29 DIAGNOSIS — I10 Essential (primary) hypertension: Secondary | ICD-10-CM

## 2018-01-29 DIAGNOSIS — I25810 Atherosclerosis of coronary artery bypass graft(s) without angina pectoris: Secondary | ICD-10-CM | POA: Diagnosis not present

## 2018-01-29 DIAGNOSIS — K219 Gastro-esophageal reflux disease without esophagitis: Secondary | ICD-10-CM

## 2018-01-29 DIAGNOSIS — Z23 Encounter for immunization: Secondary | ICD-10-CM | POA: Diagnosis not present

## 2018-01-29 DIAGNOSIS — E785 Hyperlipidemia, unspecified: Secondary | ICD-10-CM | POA: Diagnosis not present

## 2018-01-29 MED ORDER — PANTOPRAZOLE SODIUM 40 MG PO TBEC: 40.0000 mg | DELAYED_RELEASE_TABLET | Freq: Every day | ORAL | 1 refills | Status: DC

## 2018-01-29 MED ORDER — MELOXICAM 7.5 MG PO TABS: 7.5000 mg | ORAL_TABLET | Freq: Every day | ORAL | 0 refills | Status: DC

## 2018-01-29 MED ORDER — LISINOPRIL 5 MG PO TABS: 5.0000 mg | ORAL_TABLET | Freq: Every day | ORAL | 1 refills | Status: DC

## 2018-01-29 MED ORDER — METOPROLOL TARTRATE 25 MG PO TABS: ORAL_TABLET | ORAL | 1 refills | Status: DC

## 2018-01-29 MED ORDER — ATORVASTATIN CALCIUM 80 MG PO TABS: 80.0000 mg | ORAL_TABLET | Freq: Every day | ORAL | 1 refills | Status: DC

## 2018-01-29 NOTE — Progress Notes (Signed)
Date:  01/29/2018   Name:  Matthew Humphrey   DOB:  March 13, 1946   MRN:  [id number]   Chief Complaint: Hypertension; Hyperlipidemia; Gastroesophageal Reflux; and Flu Vaccine Hypertension  This is a chronic problem. The current episode started more than 1 year ago. The problem is unchanged. The problem is controlled. Pertinent negatives include no anxiety, blurred vision, chest pain, headaches, malaise/fatigue, neck pain, orthopnea, palpitations, peripheral edema, PND, shortness of breath or sweats. There are no associated agents to hypertension. There are no known risk factors for coronary artery disease. Past treatments include ACE inhibitors and beta blockers. The current treatment provides moderate improvement. There are no compliance problems.  There is no history of angina, kidney disease, CAD/MI, CVA, heart failure, left ventricular hypertrophy, PVD or retinopathy. There is no history of chronic renal disease, hyperaldosteronism or a hypertension causing med.  Hyperlipidemia  This is a chronic problem. The current episode started more than 1 year ago. The problem is controlled. He has no history of chronic renal disease, diabetes, hypothyroidism, liver disease, obesity or nephrotic syndrome. There are no known factors aggravating his hyperlipidemia. Pertinent negatives include no chest pain, focal sensory loss, focal weakness, leg pain, myalgias or shortness of breath. Current antihyperlipidemic treatment includes statins. The current treatment provides moderate improvement of lipids. There are no compliance problems.  Risk factors for coronary artery disease include dyslipidemia and hypertension.  Gastroesophageal Reflux  He reports no abdominal pain, no belching, no chest pain, no choking, no coughing, no dysphagia, no early satiety, no heartburn, no hoarse voice, no nausea, no sore throat, no stridor, no tooth decay, no water brash or no wheezing. This is a chronic problem. The current episode  started more than 1 year ago. The problem has been unchanged ("not any more"). He has tried a PPI for the symptoms. The treatment provided moderate relief.  Leg Pain   There was no injury mechanism. The pain is present in the left knee. The quality of the pain is described as aching. The pain is moderate. Pertinent negatives include no inability to bear weight, loss of motion, loss of sensation, muscle weakness, numbness or tingling. The symptoms are aggravated by movement and weight bearing.     Review of Systems  Constitutional: Negative for chills, fever and malaise/fatigue.  HENT: Negative for drooling, ear discharge, ear pain, hoarse voice and sore throat.   Eyes: Negative for blurred vision.  Respiratory: Negative for cough, choking, shortness of breath and wheezing.   Cardiovascular: Negative for chest pain, palpitations, orthopnea, leg swelling and PND.  Gastrointestinal: Negative for abdominal pain, blood in stool, constipation, diarrhea, dysphagia, heartburn and nausea.  Endocrine: Negative for polydipsia.  Genitourinary: Negative for dysuria, frequency, hematuria and urgency.  Musculoskeletal: Negative for back pain, myalgias and neck pain.  Skin: Negative for rash.  Allergic/Immunologic: Negative for environmental allergies.  Neurological: Negative for dizziness, tingling, focal weakness, numbness and headaches.  Hematological: Does not bruise/bleed easily.  Psychiatric/Behavioral: Negative for suicidal ideas. The patient is not nervous/anxious.     Patient Active Problem List   Diagnosis Date Noted  . Coronary artery disease involving autologous vein coronary bypass graft without angina pectoris 09/04/2017  . Cervical disc disease 09/04/2017  . Special screening for malignant neoplasms, colon   . Primary osteoarthritis of both knees 08/22/2016  . Radicular leg pain 12/15/2015  . Essential hypertension 12/16/2014  . Hyperlipidemia 12/16/2014  . Esophageal reflux 12/16/2014     Allergies  Allergen Reactions  . Sulfa  Antibiotics Other (See Comments)    Past Surgical History:  Procedure Laterality Date  . CARDIAC SURGERY  03/2010   bypass  . CERVICAL FUSION  12/11/2012   C4-5,C5-6,C6-7  . COLONOSCOPY  2011 ?  . COLONOSCOPY WITH PROPOFOL N/A 07/11/2017   Procedure: COLONOSCOPY WITH PROPOFOL;  Surgeon: Lin Landsman, MD;  Location: [place];  Service: Endoscopy;  Laterality: N/A;  . POLYPECTOMY N/A 07/11/2017   Procedure: POLYPECTOMY;  Surgeon: Lin Landsman, MD;  Location: [place];  Service: Endoscopy;  Laterality: N/A;    Social History   Tobacco Use  . Smoking status: Former Smoker    Packs/day: 1.50    Years: 30.00    Pack years: 45.00    Types: Cigarettes    Last attempt to quit: 1997    Years since quitting: 22.8  . Smokeless tobacco: Former Systems developer    Types: Chew  . Tobacco comment: smoking cessation materials not required  Substance Use Topics  . Alcohol use: No    Alcohol/week: 0.0 standard drinks  . Drug use: No     Medication list has been reviewed and updated.  Current Meds  Medication Sig  . aspirin EC 81 MG tablet Take 1 tablet (81 mg total) by mouth daily.  Marland Kitchen atorvastatin (LIPITOR) 80 MG tablet Take 1 tablet (80 mg total) by mouth daily.  Marland Kitchen lisinopril (PRINIVIL,ZESTRIL) 5 MG tablet Take 1 tablet (5 mg total) by mouth daily.  . metoprolol tartrate (LOPRESSOR) 25 MG tablet TAKE ONE-HALF TABLET BY  MOUTH TWICE A DAY  . pantoprazole (PROTONIX) 40 MG tablet Take 1 tablet (40 mg total) by mouth daily.    PHQ 2/9 Scores 01/29/2018 09/04/2017 09/04/2017 06/11/2017  PHQ - 2 Score 0 0 0 0  PHQ- 9 Score 0 0 - 0    Physical Exam  Constitutional: He is oriented to person, place, and time.  HENT:  Head: Normocephalic.  Right Ear: External ear normal.  Left Ear: External ear normal.  Nose: Nose normal.  Mouth/Throat: Oropharynx is clear and moist.  Eyes: Pupils are equal, round, and reactive to light.  Conjunctivae and EOM are normal. Right eye exhibits no discharge. Left eye exhibits no discharge. No scleral icterus.  Neck: Normal range of motion. Neck supple. No JVD present. No tracheal deviation present. No thyromegaly present.  Cardiovascular: Normal rate, regular rhythm, normal heart sounds and intact distal pulses. Exam reveals no gallop and no friction rub.  No murmur heard. Pulmonary/Chest: Breath sounds normal. No respiratory distress. He has no wheezes. He has no rales.  Abdominal: Soft. Bowel sounds are normal. He exhibits no mass. There is no hepatosplenomegaly. There is no tenderness. There is no rebound, no guarding and no CVA tenderness.  Musculoskeletal: Normal range of motion. He exhibits no edema or tenderness.  Lymphadenopathy:    He has no cervical adenopathy.  Neurological: He is alert and oriented to person, place, and time. He has normal strength and normal reflexes. No cranial nerve deficit.  Skin: Skin is warm. No rash noted.  Nursing note and vitals reviewed.   BP 118/60   Pulse 68   Ht 5\' 6"  (1.676 m)   Wt 190 lb (86.2 kg)   BMI 30.67 kg/m   Assessment and Plan:  1. Essential hypertension Chronic Stable Controlled. Continue lisinopril 5 mg and metoprolol 12.5 mg bid(one half 25 mg) daily. Reviewed previous renal panel. - lisinopril (PRINIVIL,ZESTRIL) 5 MG tablet; Take 1 tablet (5 mg total) by mouth daily.  Dispense: 90 tablet; Refill: 1 - metoprolol tartrate (LOPRESSOR) 25 MG tablet; TAKE ONE-HALF TABLET BY  MOUTH TWICE A DAY  Dispense: 90 tablet; Refill: 1  2. Coronary artery disease involving autologous vein coronary bypass graft without angina pectoris Chronic Stable. Controlled Continue ace and beta blocker for medical control of CAD. - lisinopril (PRINIVIL,ZESTRIL) 5 MG tablet; Take 1 tablet (5 mg total) by mouth daily.  Dispense: 90 tablet; Refill: 1 - metoprolol tartrate (LOPRESSOR) 25 MG tablet; TAKE ONE-HALF TABLET BY  MOUTH TWICE A DAY  Dispense:  90 tablet; Refill: 1 - atorvastatin (LIPITOR) 80 MG tablet; Take 1 tablet (80 mg total) by mouth daily.  Dispense: 90 tablet; Refill: 1  3. Hyperlipidemia, unspecified hyperlipidemia type Chronic Controlled. Continue atorvastatin 80 mg daily  - atorvastatin (LIPITOR) 80 MG tablet; Take 1 tablet (80 mg total) by mouth daily.  Dispense: 90 tablet; Refill: 1  4. Gastroesophageal reflux disease, esophagitis presence not specified Chronic Controlled Continue pantoprazole 40 mg daily. - pantoprazole (PROTONIX) 40 MG tablet; Take 1 tablet (40 mg total) by mouth daily.  Dispense: 90 tablet; Refill: 1  5. Acute pain of left knee New onset right knee pain without injury. Trial Meloxicam 7.5 mg daily   6. Flu vaccine need Discussed and administered. - Flu vaccine HIGH DOSE PF (Fluzone High dose)   Dr. Otilio Miu [facility]  01/29/2018

## 2018-02-22 DIAGNOSIS — D519 Vitamin B12 deficiency anemia, unspecified: Secondary | ICD-10-CM | POA: Diagnosis not present

## 2018-02-22 DIAGNOSIS — E119 Type 2 diabetes mellitus without complications: Secondary | ICD-10-CM | POA: Diagnosis not present

## 2018-02-22 DIAGNOSIS — E559 Vitamin D deficiency, unspecified: Secondary | ICD-10-CM | POA: Diagnosis not present

## 2018-02-22 DIAGNOSIS — E782 Mixed hyperlipidemia: Secondary | ICD-10-CM | POA: Diagnosis not present

## 2018-03-04 ENCOUNTER — Other Ambulatory Visit: Payer: Self-pay

## 2018-03-04 DIAGNOSIS — M25562 Pain in left knee: Secondary | ICD-10-CM

## 2018-03-04 NOTE — Progress Notes (Unsigned)
Pt called c/o left knee/leg pain still. Not any better after Meloxicam. Referral to ortho

## 2018-03-11 DIAGNOSIS — M1712 Unilateral primary osteoarthritis, left knee: Secondary | ICD-10-CM | POA: Diagnosis not present

## 2018-03-11 DIAGNOSIS — M25562 Pain in left knee: Secondary | ICD-10-CM | POA: Diagnosis not present

## 2018-03-12 ENCOUNTER — Ambulatory Visit (INDEPENDENT_AMBULATORY_CARE_PROVIDER_SITE_OTHER): Payer: Medicare Other | Admitting: Family Medicine

## 2018-03-12 ENCOUNTER — Encounter: Payer: Self-pay | Admitting: Family Medicine

## 2018-03-12 VITALS — BP 100/60 | HR 63 | Ht 66.0 in | Wt 194.0 lb

## 2018-03-12 DIAGNOSIS — E669 Obesity, unspecified: Secondary | ICD-10-CM | POA: Diagnosis not present

## 2018-03-12 DIAGNOSIS — I10 Essential (primary) hypertension: Secondary | ICD-10-CM

## 2018-03-12 DIAGNOSIS — E785 Hyperlipidemia, unspecified: Secondary | ICD-10-CM | POA: Diagnosis not present

## 2018-03-12 DIAGNOSIS — K219 Gastro-esophageal reflux disease without esophagitis: Secondary | ICD-10-CM | POA: Diagnosis not present

## 2018-03-12 DIAGNOSIS — I25810 Atherosclerosis of coronary artery bypass graft(s) without angina pectoris: Secondary | ICD-10-CM

## 2018-03-12 MED ORDER — PANTOPRAZOLE SODIUM 40 MG PO TBEC: 40.0000 mg | DELAYED_RELEASE_TABLET | Freq: Every day | ORAL | 1 refills | Status: DC

## 2018-03-12 MED ORDER — METOPROLOL TARTRATE 25 MG PO TABS: ORAL_TABLET | ORAL | 1 refills | Status: DC

## 2018-03-12 MED ORDER — ATORVASTATIN CALCIUM 80 MG PO TABS: 80.0000 mg | ORAL_TABLET | Freq: Every day | ORAL | 1 refills | Status: DC

## 2018-03-12 MED ORDER — LISINOPRIL 5 MG PO TABS: 5.0000 mg | ORAL_TABLET | Freq: Every day | ORAL | 1 refills | Status: DC

## 2018-03-12 MED ORDER — ASPIRIN EC 81 MG PO TBEC: 81.0000 mg | DELAYED_RELEASE_TABLET | Freq: Every day | ORAL | 3 refills | Status: DC

## 2018-03-12 NOTE — Progress Notes (Signed)
Date:  03/12/2018   Name:  Matthew Humphrey   DOB:  05/17/1945   MRN:  [id number]   Chief Complaint: Hypertension; Hyperlipidemia; and Gastroesophageal Reflux  Hypertension  This is a chronic problem. The current episode started more than 1 year ago. The problem is unchanged. The problem is controlled. Pertinent negatives include no anxiety, blurred vision, chest pain, headaches, malaise/fatigue, neck pain, orthopnea, palpitations, peripheral edema, PND, shortness of breath or sweats. There are no associated agents to hypertension. There are no known risk factors for coronary artery disease. Past treatments include ACE inhibitors and beta blockers. The current treatment provides moderate improvement. There are no compliance problems.  Hypertensive end-organ damage includes CAD/MI. There is no history of angina, kidney disease, CVA, heart failure, left ventricular hypertrophy, PVD or retinopathy. There is no history of chronic renal disease, a hypertension causing med or renovascular disease.  Hyperlipidemia  This is a chronic problem. The current episode started more than 1 year ago. The problem is controlled. Recent lipid tests were reviewed and are normal. He has no history of chronic renal disease, diabetes, hypothyroidism, liver disease or nephrotic syndrome. Pertinent negatives include no chest pain, myalgias or shortness of breath. The current treatment provides moderate improvement of lipids. There are no compliance problems.  Risk factors for coronary artery disease include dyslipidemia, hypertension and obesity.  Gastroesophageal Reflux  He reports no abdominal pain, no belching, no chest pain, no choking, no coughing, no dysphagia, no early satiety, no globus sensation, no heartburn, no hoarse voice, no nausea, no sore throat, no stridor or no wheezing. The problem has been unchanged. Nothing aggravates the symptoms. Pertinent negatives include no fatigue. Risk factors include obesity. He  has tried a PPI for the symptoms. The treatment provided moderate relief. Past procedures do not include an abdominal ultrasound, an EGD, esophageal pH monitoring, H. pylori antibody titer or a UGI.  Heart Problem  This is a chronic problem. The current episode started more than 1 year ago (bypass 2011). The problem occurs rarely. Pertinent negatives include no abdominal pain, anorexia, arthralgias, change in bowel habit, chest pain, chills, coughing, diaphoresis, fatigue, fever, headaches, myalgias, nausea, neck pain, rash, sore throat or swollen glands. Nothing aggravates the symptoms.     Review of Systems  Constitutional: Negative for chills, diaphoresis, fatigue, fever and malaise/fatigue.  HENT: Negative for drooling, ear discharge, ear pain, hoarse voice and sore throat.   Eyes: Negative for blurred vision.  Respiratory: Negative for cough, choking, shortness of breath and wheezing.   Cardiovascular: Negative for chest pain, palpitations, orthopnea, leg swelling and PND.  Gastrointestinal: Negative for abdominal pain, anorexia, blood in stool, change in bowel habit, constipation, diarrhea, dysphagia, heartburn and nausea.  Endocrine: Negative for polydipsia.  Genitourinary: Negative for dysuria, frequency, hematuria and urgency.  Musculoskeletal: Negative for arthralgias, back pain, myalgias and neck pain.  Skin: Negative for rash.  Allergic/Immunologic: Negative for environmental allergies.  Neurological: Negative for dizziness and headaches.  Hematological: Does not bruise/bleed easily.  Psychiatric/Behavioral: Negative for suicidal ideas. The patient is not nervous/anxious.     Patient Active Problem List   Diagnosis Date Noted  . Coronary artery disease involving autologous vein coronary bypass graft without angina pectoris 09/04/2017  . Cervical disc disease 09/04/2017  . Special screening for malignant neoplasms, colon   . Primary osteoarthritis of both knees 08/22/2016  .  Radicular leg pain 12/15/2015  . Essential hypertension 12/16/2014  . Hyperlipidemia 12/16/2014  . Esophageal reflux 12/16/2014  Allergies  Allergen Reactions  . Sulfa Antibiotics Other (See Comments)    Past Surgical History:  Procedure Laterality Date  . CARDIAC SURGERY  03/2010   bypass  . CERVICAL FUSION  12/11/2012   C4-5,C5-6,C6-7  . COLONOSCOPY  2011 ?  . COLONOSCOPY WITH PROPOFOL N/A 07/11/2017   Procedure: COLONOSCOPY WITH PROPOFOL;  Surgeon: Lin Landsman, MD;  Location: [facility];  Service: Endoscopy;  Laterality: N/A;  . POLYPECTOMY N/A 07/11/2017   Procedure: POLYPECTOMY;  Surgeon: Lin Landsman, MD;  Location: [facility];  Service: Endoscopy;  Laterality: N/A;    Social History   Tobacco Use  . Smoking status: Former Smoker    Packs/day: 1.50    Years: 30.00    Pack years: 45.00    Types: Cigarettes    Last attempt to quit: 1997    Years since quitting: 22.9  . Smokeless tobacco: Former Systems developer    Types: Chew  . Tobacco comment: smoking cessation materials not required  Substance Use Topics  . Alcohol use: No    Alcohol/week: 0.0 standard drinks  . Drug use: No     Medication list has been reviewed and updated.  Current Meds  Medication Sig  . aspirin EC 81 MG tablet Take 1 tablet (81 mg total) by mouth daily.  Marland Kitchen atorvastatin (LIPITOR) 80 MG tablet Take 1 tablet (80 mg total) by mouth daily.  Marland Kitchen lisinopril (PRINIVIL,ZESTRIL) 5 MG tablet Take 1 tablet (5 mg total) by mouth daily.  . metoprolol tartrate (LOPRESSOR) 25 MG tablet TAKE ONE-HALF TABLET BY  MOUTH TWICE A DAY  . pantoprazole (PROTONIX) 40 MG tablet Take 1 tablet (40 mg total) by mouth daily.  . [DISCONTINUED] aspirin EC 81 MG tablet Take 1 tablet (81 mg total) by mouth daily.  . [DISCONTINUED] atorvastatin (LIPITOR) 80 MG tablet Take 1 tablet (80 mg total) by mouth daily.  . [DISCONTINUED] lisinopril (PRINIVIL,ZESTRIL) 5 MG tablet Take 1 tablet (5 mg total) by  mouth daily.  . [DISCONTINUED] meloxicam (MOBIC) 7.5 MG tablet Take 1 tablet (7.5 mg total) by mouth daily.  . [DISCONTINUED] metoprolol tartrate (LOPRESSOR) 25 MG tablet TAKE ONE-HALF TABLET BY  MOUTH TWICE A DAY  . [DISCONTINUED] pantoprazole (PROTONIX) 40 MG tablet Take 1 tablet (40 mg total) by mouth daily.    PHQ 2/9 Scores 01/29/2018 09/04/2017 09/04/2017 06/11/2017  PHQ - 2 Score 0 0 0 0  PHQ- 9 Score 0 0 - 0    Physical Exam  Constitutional: He is oriented to person, place, and time.  HENT:  Head: Normocephalic.  Right Ear: External ear normal.  Left Ear: External ear normal.  Nose: Nose normal.  Mouth/Throat: Oropharynx is clear and moist.  Eyes: Pupils are equal, round, and reactive to light. Conjunctivae and EOM are normal. Right eye exhibits no discharge. Left eye exhibits no discharge. No scleral icterus.  Neck: Normal range of motion. Neck supple. No JVD present. No tracheal deviation present. No thyromegaly present.  Cardiovascular: Normal rate, regular rhythm, normal heart sounds and intact distal pulses. Exam reveals no gallop and no friction rub.  No murmur heard. Pulmonary/Chest: Breath sounds normal. No respiratory distress. He has no wheezes. He has no rales.  Abdominal: Soft. Bowel sounds are normal. He exhibits no mass. There is no hepatosplenomegaly. There is no tenderness. There is no rebound, no guarding and no CVA tenderness.  Musculoskeletal: Normal range of motion. He exhibits no edema or tenderness.  Lymphadenopathy:    He has no  cervical adenopathy.  Neurological: He is alert and oriented to person, place, and time. He has normal strength and normal reflexes. No cranial nerve deficit.  Skin: Skin is warm. No rash noted.    BP 100/60   Pulse 63   Ht 5\' 6"  (1.676 m)   Wt 194 lb (88 kg)   BMI 31.31 kg/m   Assessment and Plan:  1. Coronary artery disease involving autologous vein coronary bypass graft without angina pectoris Chronic/ controlled.  Continue asp 81mg - refilled lisinopril and metoprolol - aspirin EC 81 MG tablet; Take 1 tablet (81 mg total) by mouth daily.  Dispense: 90 tablet; Refill: 3 - lisinopril (PRINIVIL,ZESTRIL) 5 MG tablet; Take 1 tablet (5 mg total) by mouth daily.  Dispense: 90 tablet; Refill: 1 - metoprolol tartrate (LOPRESSOR) 25 MG tablet; TAKE ONE-HALF TABLET BY  MOUTH TWICE A DAY  Dispense: 90 tablet; Refill: 1 - atorvastatin (LIPITOR) 80 MG tablet; Take 1 tablet (80 mg total) by mouth daily.  Dispense: 90 tablet; Refill: 1  2. Essential hypertension Chronic/ controlled/ stable on meds- refill lisinopril and metoprolol/ draw renal panel - lisinopril (PRINIVIL,ZESTRIL) 5 MG tablet; Take 1 tablet (5 mg total) by mouth daily.  Dispense: 90 tablet; Refill: 1 - metoprolol tartrate (LOPRESSOR) 25 MG tablet; TAKE ONE-HALF TABLET BY  MOUTH TWICE A DAY  Dispense: 90 tablet; Refill: 1 - Renal Function Panel  3. Gastroesophageal reflux disease, esophagitis presence not specified Chronic/ stable- refill pantoprazole - pantoprazole (PROTONIX) 40 MG tablet; Take 1 tablet (40 mg total) by mouth daily.  Dispense: 90 tablet; Refill: 1  4. Obesity (BMI 30.0-34.9) Discussed diet and exercise  5. Mild hyperlipidemia Chronic/ controlled- stable on med- refill atorvastatin/ draw lipid - atorvastatin (LIPITOR) 80 MG tablet; Take 1 tablet (80 mg total) by mouth daily.  Dispense: 90 tablet; Refill: 1 - Lipid panel   Dr. Otilio Miu [facility]  03/12/2018

## 2018-03-13 LAB — RENAL FUNCTION PANEL
Albumin: 4.4 g/dL (ref 3.5–4.8)
BUN/Creatinine Ratio: 12 (ref 10–24)
BUN: 20 mg/dL (ref 8–27)
CO2: 24 mmol/L (ref 20–29)
Calcium: 10.1 mg/dL (ref 8.6–10.2)
Chloride: 104 mmol/L (ref 96–106)
Creatinine, Ser: 1.7 mg/dL — ABNORMAL HIGH (ref 0.76–1.27)
GFR calc Af Amer: 46 mL/min/{1.73_m2} — ABNORMAL LOW (ref >59)
GFR calc non Af Amer: 40 mL/min/{1.73_m2} — ABNORMAL LOW (ref >59)
Glucose: 97 mg/dL (ref 65–99)
Phosphorus: 2.5 mg/dL (ref 2.5–4.5)
Potassium: 5 mmol/L (ref 3.5–5.2)
Sodium: 141 mmol/L (ref 134–144)

## 2018-03-13 LAB — LIPID PANEL
Chol/HDL Ratio: 3.9 ratio (ref 0.0–5.0)
Cholesterol, Total: 165 mg/dL (ref 100–199)
HDL: 42 mg/dL (ref >39)
LDL Calculated: 101 mg/dL — ABNORMAL HIGH (ref 0–99)
TRIGLYCERIDES: 108 mg/dL (ref 0–149)
VLDL Cholesterol Cal: 22 mg/dL (ref 5–40)

## 2018-04-09 DIAGNOSIS — M1712 Unilateral primary osteoarthritis, left knee: Secondary | ICD-10-CM | POA: Diagnosis not present

## 2018-04-09 DIAGNOSIS — M5416 Radiculopathy, lumbar region: Secondary | ICD-10-CM | POA: Diagnosis not present

## 2018-04-10 DIAGNOSIS — M6281 Muscle weakness (generalized): Secondary | ICD-10-CM | POA: Diagnosis not present

## 2018-04-10 DIAGNOSIS — M5416 Radiculopathy, lumbar region: Secondary | ICD-10-CM | POA: Diagnosis not present

## 2018-04-16 DIAGNOSIS — M6281 Muscle weakness (generalized): Secondary | ICD-10-CM | POA: Diagnosis not present

## 2018-04-16 DIAGNOSIS — M5416 Radiculopathy, lumbar region: Secondary | ICD-10-CM | POA: Diagnosis not present

## 2018-04-18 DIAGNOSIS — M6281 Muscle weakness (generalized): Secondary | ICD-10-CM | POA: Diagnosis not present

## 2018-04-18 DIAGNOSIS — M5416 Radiculopathy, lumbar region: Secondary | ICD-10-CM | POA: Diagnosis not present

## 2018-04-24 ENCOUNTER — Encounter: Payer: Self-pay | Admitting: Family Medicine

## 2018-04-24 ENCOUNTER — Ambulatory Visit (INDEPENDENT_AMBULATORY_CARE_PROVIDER_SITE_OTHER): Payer: Medicare Other | Admitting: Family Medicine

## 2018-04-24 VITALS — BP 120/60 | HR 60 | Ht 66.0 in | Wt 199.0 lb

## 2018-04-24 DIAGNOSIS — R55 Syncope and collapse: Secondary | ICD-10-CM

## 2018-04-24 DIAGNOSIS — I25810 Atherosclerosis of coronary artery bypass graft(s) without angina pectoris: Secondary | ICD-10-CM

## 2018-04-24 DIAGNOSIS — I10 Essential (primary) hypertension: Secondary | ICD-10-CM | POA: Diagnosis not present

## 2018-04-24 DIAGNOSIS — I259 Chronic ischemic heart disease, unspecified: Secondary | ICD-10-CM | POA: Diagnosis not present

## 2018-04-24 NOTE — Progress Notes (Signed)
Date:  04/24/2018   Name:  Matthew Humphrey   DOB:  10/30/1945   MRN:  [id number]   Chief Complaint: Follow-up (b/p recheck due to stopped lisinopril at last visit)  Hypertension  This is a chronic problem. The current episode started more than 1 year ago. The problem has been gradually improving since onset. The problem is controlled. Associated symptoms include chest pain. Pertinent negatives include no anxiety, blurred vision, headaches, neck pain, orthopnea, palpitations, peripheral edema, PND, shortness of breath or sweats. There are no associated agents to hypertension. There are no known risk factors (spells) for coronary artery disease. Past treatments include beta blockers. The current treatment provides mild improvement. There are no compliance problems.  There is no history of angina, kidney disease, CAD/MI, CVA, heart failure, left ventricular hypertrophy or PVD. There is no history of chronic renal disease, a hypertension causing med or renovascular disease.  Dizziness  This is a recurrent problem. The current episode started more than 1 month ago (last episode last week/6 months). The problem occurs intermittently. Progression since onset: comes and goes. Associated symptoms include chest pain and diaphoresis. Pertinent negatives include no abdominal pain, chills, coughing, fatigue, fever, headaches, myalgias, nausea, neck pain, rash or sore throat. Associated symptoms comments: 'little bit tight". Nothing aggravates the symptoms. He has tried nothing for the symptoms.  Chest Pain   This is a new problem. The current episode started in the past 7 days. Associated symptoms include diaphoresis and dizziness. Pertinent negatives include no abdominal pain, back pain, cough, fever, headaches, nausea, orthopnea, palpitations, PND or shortness of breath.  His past medical history is significant for hypertension.  Pertinent negatives for past medical history include no PVD.    Review of  Systems  Constitutional: Positive for diaphoresis. Negative for chills, fatigue and fever.  HENT: Negative for drooling, ear discharge, ear pain and sore throat.   Eyes: Negative for blurred vision.  Respiratory: Positive for chest tightness. Negative for cough, shortness of breath and wheezing.   Cardiovascular: Positive for chest pain. Negative for palpitations, orthopnea, leg swelling and PND.  Gastrointestinal: Negative for abdominal pain, blood in stool, constipation, diarrhea and nausea.  Endocrine: Negative for polydipsia.  Genitourinary: Negative for dysuria, frequency, hematuria and urgency.  Musculoskeletal: Negative for back pain, myalgias and neck pain.  Skin: Negative for rash.  Allergic/Immunologic: Negative for environmental allergies.  Neurological: Positive for dizziness. Negative for headaches.  Hematological: Does not bruise/bleed easily.  Psychiatric/Behavioral: Negative for suicidal ideas. The patient is not nervous/anxious.     Patient Active Problem List   Diagnosis Date Noted  . Coronary artery disease involving autologous vein coronary bypass graft without angina pectoris 09/04/2017  . Cervical disc disease 09/04/2017  . Special screening for malignant neoplasms, colon   . Primary osteoarthritis of both knees 08/22/2016  . Radicular leg pain 12/15/2015  . Essential hypertension 12/16/2014  . Hyperlipidemia 12/16/2014  . Esophageal reflux 12/16/2014    Allergies  Allergen Reactions  . Sulfa Antibiotics Other (See Comments)    Past Surgical History:  Procedure Laterality Date  . CARDIAC SURGERY  03/2010   bypass  . CERVICAL FUSION  12/11/2012   C4-5,C5-6,C6-7  . COLONOSCOPY  2011 ?  . COLONOSCOPY WITH PROPOFOL N/A 07/11/2017   Procedure: COLONOSCOPY WITH PROPOFOL;  Surgeon: Lin Landsman, MD;  Location: [place];  Service: Endoscopy;  Laterality: N/A;  . POLYPECTOMY N/A 07/11/2017   Procedure: POLYPECTOMY;  Surgeon: Lin Landsman, MD;  Location: [place];  Service: Endoscopy;  Laterality: N/A;    Social History   Tobacco Use  . Smoking status: Former Smoker    Packs/day: 1.50    Years: 30.00    Pack years: 45.00    Types: Cigarettes    Last attempt to quit: 1997    Years since quitting: 23.0  . Smokeless tobacco: Former Systems developer    Types: Chew  . Tobacco comment: smoking cessation materials not required  Substance Use Topics  . Alcohol use: No    Alcohol/week: 0.0 standard drinks  . Drug use: No     Medication list has been reviewed and updated.  Current Meds  Medication Sig  . aspirin EC 81 MG tablet Take 1 tablet (81 mg total) by mouth daily.  Marland Kitchen atorvastatin (LIPITOR) 80 MG tablet Take 1 tablet (80 mg total) by mouth daily.  Marland Kitchen gabapentin (NEURONTIN) 100 MG capsule Take 1 capsule by mouth at bedtime. ortho  . metoprolol tartrate (LOPRESSOR) 25 MG tablet TAKE ONE-HALF TABLET BY  MOUTH TWICE A DAY  . pantoprazole (PROTONIX) 40 MG tablet Take 1 tablet (40 mg total) by mouth daily.  . [DISCONTINUED] lisinopril (PRINIVIL,ZESTRIL) 5 MG tablet Take 1 tablet (5 mg total) by mouth daily.    PHQ 2/9 Scores 01/29/2018 09/04/2017 09/04/2017 06/11/2017  PHQ - 2 Score 0 0 0 0  PHQ- 9 Score 0 0 - 0    Physical Exam Vitals signs and nursing note reviewed.  HENT:     Head: Normocephalic.     Right Ear: External ear normal.     Left Ear: External ear normal.     Nose: Nose normal.  Eyes:     General: No scleral icterus.       Right eye: No discharge.        Left eye: No discharge.     Conjunctiva/sclera: Conjunctivae normal.     Pupils: Pupils are equal, round, and reactive to light.  Neck:     Musculoskeletal: Normal range of motion and neck supple.     Thyroid: No thyromegaly.     Vascular: No JVD.     Trachea: No tracheal deviation.  Cardiovascular:     Rate and Rhythm: Normal rate and regular rhythm.     Heart sounds: Normal heart sounds. No murmur. No friction rub. No gallop.     Pulmonary:     Effort: No respiratory distress.     Breath sounds: Normal breath sounds. No wheezing or rales.  Abdominal:     General: Bowel sounds are normal.     Palpations: Abdomen is soft. There is no mass.     Tenderness: There is no abdominal tenderness. There is no guarding or rebound.  Musculoskeletal: Normal range of motion.        General: No tenderness.  Lymphadenopathy:     Cervical: No cervical adenopathy.  Skin:    General: Skin is warm.     Capillary Refill: Capillary refill takes less than 2 seconds.     Findings: No rash.  Neurological:     Mental Status: He is alert and oriented to person, place, and time.     Cranial Nerves: No cranial nerve deficit.     Deep Tendon Reflexes: Reflexes are normal and symmetric.     BP 120/60   Pulse 60   Ht 5\' 6"  (1.676 m)   Wt 199 lb (90.3 kg)   BMI 32.12 kg/m   Assessment and Plan: 1. Near  syncope Patient is beginning to have syncopal episodes described his dizziness.  Onset more than a month ago.  Is now associated with chest pain and diaphoresis.  EKG notes no acute ischemic changes.  Will refer to cardiology - EKG 12-Lead - Ambulatory referral to Cardiology  2. Essential hypertension Patient has history of essential hypertension.  Was noted on previous visit to be hypotensive.  As of the dizziness the lisinopril was stopped and the metoprolol 25 mg was continued.  Pressure measures 120/60 today.  Continue metoprolol 5 mg XL once a day.  3. Coronary artery disease involving autologous vein coronary bypass graft without angina pectoris Patient has history of bypass surgery for this reason and the new onset of chest discomfort will refer to Dr. Clayborn Bigness at 10:00.  Would initiate ASA 81 mg. - Ambulatory referral to Cardiology  4. Chest pain due to myocardial ischemia, unspecified ischemic chest pain type Patient had new onset of chest tightness associated with these episodes of dizziness. E KG was done.I have reviewed  EKG which shows rate 72, intervals all normal rhythm is normal, is a possible left atrial enlargement patient also notes to have inferior infarct age indeterminate with ST and T wave abnormalities possibility of the lateral leads. Comparison to previous EKG dated 2016/comparison is similar to previous EKG. - Ambulatory referral to Cardiology

## 2018-04-26 DIAGNOSIS — R0602 Shortness of breath: Secondary | ICD-10-CM | POA: Diagnosis not present

## 2018-04-26 DIAGNOSIS — I10 Essential (primary) hypertension: Secondary | ICD-10-CM | POA: Diagnosis not present

## 2018-04-26 DIAGNOSIS — I208 Other forms of angina pectoris: Secondary | ICD-10-CM | POA: Diagnosis not present

## 2018-04-26 DIAGNOSIS — I25118 Atherosclerotic heart disease of native coronary artery with other forms of angina pectoris: Secondary | ICD-10-CM | POA: Diagnosis not present

## 2018-04-26 DIAGNOSIS — E785 Hyperlipidemia, unspecified: Secondary | ICD-10-CM | POA: Diagnosis not present

## 2018-04-30 DIAGNOSIS — M5416 Radiculopathy, lumbar region: Secondary | ICD-10-CM | POA: Diagnosis not present

## 2018-04-30 DIAGNOSIS — M5136 Other intervertebral disc degeneration, lumbar region: Secondary | ICD-10-CM | POA: Diagnosis not present

## 2018-05-16 DIAGNOSIS — R0602 Shortness of breath: Secondary | ICD-10-CM | POA: Diagnosis not present

## 2018-05-16 DIAGNOSIS — I208 Other forms of angina pectoris: Secondary | ICD-10-CM | POA: Diagnosis not present

## 2018-05-16 DIAGNOSIS — I25118 Atherosclerotic heart disease of native coronary artery with other forms of angina pectoris: Secondary | ICD-10-CM | POA: Diagnosis not present

## 2018-06-12 ENCOUNTER — Other Ambulatory Visit: Payer: Self-pay

## 2018-06-12 ENCOUNTER — Ambulatory Visit (INDEPENDENT_AMBULATORY_CARE_PROVIDER_SITE_OTHER): Payer: Medicare Other

## 2018-06-12 VITALS — BP 110/76 | HR 68 | Temp 98.1°F | Resp 16 | Ht 66.0 in | Wt 201.2 lb

## 2018-06-12 DIAGNOSIS — Z0000 Encounter for general adult medical examination without abnormal findings: Secondary | ICD-10-CM

## 2018-06-12 DIAGNOSIS — M4620 Osteomyelitis of vertebra, site unspecified: Secondary | ICD-10-CM | POA: Insufficient documentation

## 2018-06-12 DIAGNOSIS — A4902 Methicillin resistant Staphylococcus aureus infection, unspecified site: Secondary | ICD-10-CM | POA: Insufficient documentation

## 2018-06-12 DIAGNOSIS — K279 Peptic ulcer, site unspecified, unspecified as acute or chronic, without hemorrhage or perforation: Secondary | ICD-10-CM | POA: Insufficient documentation

## 2018-06-12 DIAGNOSIS — J9851 Mediastinitis: Secondary | ICD-10-CM | POA: Insufficient documentation

## 2018-06-12 DIAGNOSIS — N189 Chronic kidney disease, unspecified: Secondary | ICD-10-CM | POA: Insufficient documentation

## 2018-06-12 NOTE — Patient Instructions (Signed)
Matthew Humphrey , Thank you for taking time to come for your Medicare Wellness Visit. I appreciate your ongoing commitment to your health goals. Please review the following plan we discussed and let me know if I can assist you in the future.   Screening recommendations/referrals: Colonoscopy: done 07/11/17  Recommended yearly ophthalmology/optometry visit for glaucoma screening and checkup Recommended yearly dental visit for hygiene and checkup  Vaccinations: Influenza vaccine: done 01/29/18 Pneumococcal vaccine: done 08/22/16 Tdap vaccine: done 06/22/17 Shingles vaccine: done 05/13/18    Advanced directives: Please bring a copy of your health care power of attorney and living will to the office at your convenience once you have completed those documents.   Conditions/risks identified: Keep up the great work!  Next appointment: Please follow up in one year for your Medicare Annual Wellness visit.    Preventive Care 9 Years and Older, Male Preventive care refers to lifestyle choices and visits with your health care provider that can promote health and wellness. What does preventive care include?  A yearly physical exam. This is also called an annual well check.  Dental exams once or twice a year.  Routine eye exams. Ask your health care provider how often you should have your eyes checked.  Personal lifestyle choices, including:  Daily care of your teeth and gums.  Regular physical activity.  Eating a healthy diet.  Avoiding tobacco and drug use.  Limiting alcohol use.  Practicing safe sex.  Taking low doses of aspirin every day.  Taking vitamin and mineral supplements as recommended by your health care provider. What happens during an annual well check? The services and screenings done by your health care provider during your annual well check will depend on your age, overall health, lifestyle risk factors, and family history of disease. Counseling  Your health care provider  may ask you questions about your:  Alcohol use.  Tobacco use.  Drug use.  Emotional well-being.  Home and relationship well-being.  Sexual activity.  Eating habits.  History of falls.  Memory and ability to understand (cognition).  Work and work Statistician. Screening  You may have the following tests or measurements:  Height, weight, and BMI.  Blood pressure.  Lipid and cholesterol levels. These may be checked every 5 years, or more frequently if you are over 84 years old.  Skin check.  Lung cancer screening. You may have this screening every year starting at age 74 if you have a 30-pack-year history of smoking and currently smoke or have quit within the past 15 years.  Fecal occult blood test (FOBT) of the stool. You may have this test every year starting at age 26.  Flexible sigmoidoscopy or colonoscopy. You may have a sigmoidoscopy every 5 years or a colonoscopy every 10 years starting at age 83.  Prostate cancer screening. Recommendations will vary depending on your family history and other risks.  Hepatitis C blood test.  Hepatitis B blood test.  Sexually transmitted disease (STD) testing.  Diabetes screening. This is done by checking your blood sugar (glucose) after you have not eaten for a while (fasting). You may have this done every 1-3 years.  Abdominal aortic aneurysm (AAA) screening. You may need this if you are a current or former smoker.  Osteoporosis. You may be screened starting at age 63 if you are at high risk. Talk with your health care provider about your test results, treatment options, and if necessary, the need for more tests. Vaccines  Your health care provider may  recommend certain vaccines, such as:  Influenza vaccine. This is recommended every year.  Tetanus, diphtheria, and acellular pertussis (Tdap, Td) vaccine. You may need a Td booster every 10 years.  Zoster vaccine. You may need this after age 13.  Pneumococcal 13-valent  conjugate (PCV13) vaccine. One dose is recommended after age 47.  Pneumococcal polysaccharide (PPSV23) vaccine. One dose is recommended after age 22. Talk to your health care provider about which screenings and vaccines you need and how often you need them. This information is not intended to replace advice given to you by your health care provider. Make sure you discuss any questions you have with your health care provider. Document Released: 04/16/2015 Document Revised: 12/08/2015 Document Reviewed: 01/19/2015 Elsevier Interactive Patient Education  2017 [place] Prevention in the Home Falls can cause injuries. They can happen to people of all ages. There are many things you can do to make your home safe and to help prevent falls. What can I do on the outside of my home?  Regularly fix the edges of walkways and driveways and fix any cracks.  Remove anything that might make you trip as you walk through a door, such as a raised step or threshold.  Trim any bushes or trees on the path to your home.  Use bright outdoor lighting.  Clear any walking paths of anything that might make someone trip, such as rocks or tools.  Regularly check to see if handrails are loose or broken. Make sure that both sides of any steps have handrails.  Any raised decks and porches should have guardrails on the edges.  Have any leaves, snow, or ice cleared regularly.  Use sand or salt on walking paths during winter.  Clean up any spills in your garage right away. This includes oil or grease spills. What can I do in the bathroom?  Use night lights.  Install grab bars by the toilet and in the tub and shower. Do not use towel bars as grab bars.  Use non-skid mats or decals in the tub or shower.  If you need to sit down in the shower, use a plastic, non-slip stool.  Keep the floor dry. Clean up any water that spills on the floor as soon as it happens.  Remove soap buildup in the tub or  shower regularly.  Attach bath mats securely with double-sided non-slip rug tape.  Do not have throw rugs and other things on the floor that can make you trip. What can I do in the bedroom?  Use night lights.  Make sure that you have a light by your bed that is easy to reach.  Do not use any sheets or blankets that are too big for your bed. They should not hang down onto the floor.  Have a firm chair that has side arms. You can use this for support while you get dressed.  Do not have throw rugs and other things on the floor that can make you trip. What can I do in the kitchen?  Clean up any spills right away.  Avoid walking on wet floors.  Keep items that you use a lot in easy-to-reach places.  If you need to reach something above you, use a strong step stool that has a grab bar.  Keep electrical cords out of the way.  Do not use floor polish or wax that makes floors slippery. If you must use wax, use non-skid floor wax.  Do not have throw rugs and  other things on the floor that can make you trip. What can I do with my stairs?  Do not leave any items on the stairs.  Make sure that there are handrails on both sides of the stairs and use them. Fix handrails that are broken or loose. Make sure that handrails are as long as the stairways.  Check any carpeting to make sure that it is firmly attached to the stairs. Fix any carpet that is loose or worn.  Avoid having throw rugs at the top or bottom of the stairs. If you do have throw rugs, attach them to the floor with carpet tape.  Make sure that you have a light switch at the top of the stairs and the bottom of the stairs. If you do not have them, ask someone to add them for you. What else can I do to help prevent falls?  Wear shoes that:  Do not have high heels.  Have rubber bottoms.  Are comfortable and fit you well.  Are closed at the toe. Do not wear sandals.  If you use a stepladder:  Make sure that it is fully  opened. Do not climb a closed stepladder.  Make sure that both sides of the stepladder are locked into place.  Ask someone to hold it for you, if possible.  Clearly mark and make sure that you can see:  Any grab bars or handrails.  First and last steps.  Where the edge of each step is.  Use tools that help you move around (mobility aids) if they are needed. These include:  Canes.  Walkers.  Scooters.  Crutches.  Turn on the lights when you go into a dark area. Replace any light bulbs as soon as they burn out.  Set up your furniture so you have a clear path. Avoid moving your furniture around.  If any of your floors are uneven, fix them.  If there are any pets around you, be aware of where they are.  Review your medicines with your doctor. Some medicines can make you feel dizzy. This can increase your chance of falling. Ask your doctor what other things that you can do to help prevent falls. This information is not intended to replace advice given to you by your health care provider. Make sure you discuss any questions you have with your health care provider. Document Released: 01/14/2009 Document Revised: 08/26/2015 Document Reviewed: 04/24/2014 Elsevier Interactive Patient Education  2017 [employer].

## 2018-06-12 NOTE — Progress Notes (Signed)
Subjective:   Matthew Humphrey is a 73 y.o. male who presents for Medicare Annual/Subsequent preventive examination.  Review of Systems:  Cardiac Risk Factors include: advanced age (>26men, >108 women);dyslipidemia;hypertension;male gender;obesity (BMI >30kg/m2)     Objective:    Vitals: BP 110/76 (BP Location: Right Arm, Patient Position: Sitting, Cuff Size: Normal)   Pulse 68   Temp 98.1 F (36.7 C) (Oral)   Resp 16   Ht 5\' 6"  (1.676 m)   Wt 201 lb 3.2 oz (91.3 kg)   SpO2 94%   BMI 32.47 kg/m   Body mass index is 32.47 kg/m.  Advanced Directives 06/12/2018 07/11/2017 06/11/2017 05/18/2017 04/23/2017 12/27/2016 12/11/2016  Does Patient Have a Medical Advance Directive? No No No No No No No  Would patient like information on creating a medical advance directive? No - Patient declined No - Patient declined Yes (MAU/Ambulatory/Procedural Areas - Information given) (No Data) No - Patient declined - -    Tobacco Social History   Tobacco Use  Smoking Status Former Smoker  . Packs/day: 1.50  . Years: 30.00  . Pack years: 45.00  . Types: Cigarettes  . Last attempt to quit: 1997  . Years since quitting: 23.2  Smokeless Tobacco Former Systems developer  . Types: Chew  Tobacco Comment   smoking cessation materials not required     Counseling given: Not Answered Comment: smoking cessation materials not required   Clinical Intake:  Pre-visit preparation completed: Yes  Pain : No/denies pain     BMI - recorded: 32.47 Nutritional Status: BMI > 30  Obese Nutritional Risks: None Diabetes: No  How often do you need to have someone help you when you read instructions, pamphlets, or other written materials from your doctor or pharmacy?: 1 - Never What is the last grade level you completed in school?: 9th grade  Interpreter Needed?: No  Information entered by :: Clemetine Marker LPN  Past Medical History:  Diagnosis Date  . Arthritis    knee  . GERD (gastroesophageal reflux disease)     . Hyperlipidemia   . Hypertension    Past Surgical History:  Procedure Laterality Date  . CARDIAC SURGERY  03/2010   bypass  . CERVICAL FUSION  12/11/2012   C4-5,C5-6,C6-7  . COLONOSCOPY  2011 ?  . COLONOSCOPY WITH PROPOFOL N/A 07/11/2017   Procedure: COLONOSCOPY WITH PROPOFOL;  Surgeon: Lin Landsman, MD;  Location: [facility];  Service: Endoscopy;  Laterality: N/A;  . POLYPECTOMY N/A 07/11/2017   Procedure: POLYPECTOMY;  Surgeon: Lin Landsman, MD;  Location: [facility];  Service: Endoscopy;  Laterality: N/A;   Family History  Problem Relation Age of Onset  . Hypertension Father   . Hypotension Mother    Social History   Socioeconomic History  . Marital status: Single    Spouse name: Not on file  . Number of children: 3  . Years of education: Not on file  . Highest education level: 9th grade  Occupational History  . Occupation: Retired  Scientific laboratory technician  . Financial resource strain: Not very hard  . Food insecurity:    Worry: Never true    Inability: Never true  . Transportation needs:    Medical: No    Non-medical: No  Tobacco Use  . Smoking status: Former Smoker    Packs/day: 1.50    Years: 30.00    Pack years: 45.00    Types: Cigarettes    Last attempt to quit: 1997    Years  since quitting: 23.2  . Smokeless tobacco: Former Systems developer    Types: Chew  . Tobacco comment: smoking cessation materials not required  Substance and Sexual Activity  . Alcohol use: No    Alcohol/week: 0.0 standard drinks  . Drug use: No  . Sexual activity: Not Currently  Lifestyle  . Physical activity:    Days per week: 7 days    Minutes per session: 20 min  . Stress: Not at all  Relationships  . Social connections:    Talks on phone: More than three times a week    Gets together: Twice a week    Attends religious service: Never    Active member of club or organization: No    Attends meetings of clubs or organizations: Never    Relationship status:  Separated  Other Topics Concern  . Not on file  Social History Narrative  . Not on file    Outpatient Encounter Medications as of 06/12/2018  Medication Sig  . aspirin EC 81 MG tablet Take 1 tablet (81 mg total) by mouth daily.  Marland Kitchen atorvastatin (LIPITOR) 80 MG tablet Take 1 tablet (80 mg total) by mouth daily.  . metoprolol tartrate (LOPRESSOR) 25 MG tablet TAKE ONE-HALF TABLET BY  MOUTH TWICE A DAY  . pantoprazole (PROTONIX) 40 MG tablet Take 1 tablet (40 mg total) by mouth daily.   No facility-administered encounter medications on file as of 06/12/2018.     Activities of Daily Living In your present state of health, do you have any difficulty performing the following activities: 06/12/2018 07/11/2017  Hearing? N N  Comment declines hearing aids -  Vision? N N  Comment wears glasses -  Difficulty concentrating or making decisions? N N  Walking or climbing stairs? N N  Dressing or bathing? N N  Doing errands, shopping? N -  Preparing Food and eating ? N -  Using the Toilet? N -  In the past six months, have you accidently leaked urine? Y -  Do you have problems with loss of bowel control? N -  Managing your Medications? N -  Managing your Finances? N -  Housekeeping or managing your Housekeeping? N -  Some recent data might be hidden    Patient Care Team: Juline Patch, MD as PCP - General (Family Medicine)   Assessment:   This is a routine wellness examination for [place].  Exercise Activities and Dietary recommendations Current Exercise Habits: Home exercise routine, Type of exercise: Other - see comments(pedal bike), Time (Minutes): 20, Frequency (Times/Week): 7, Weekly Exercise (Minutes/Week): 140, Intensity: Mild, Exercise limited by: None identified  Goals    . DIET - INCREASE WATER INTAKE     Recommend to drink at least 6-8 8oz glasses of water per day.       Fall Risk Fall Risk  06/12/2018 01/29/2018 09/04/2017 06/11/2017 05/18/2017  Falls in the past year? 0 No  No No No  Number falls in past yr: 0 - - - -  Injury with Fall? 0 - - - -  Risk for fall due to : - - - Impaired vision -  Risk for fall due to: Comment - - - wears eyeglasses -  Follow up Falls prevention discussed - - - -   FALL RISK PREVENTION PERTAINING TO THE HOME:  Any stairs in or around the home? Yes  If so, do they handrails? Yes   Home free of loose throw rugs in walkways, pet beds, electrical cords, etc? Yes  Adequate lighting in your home to reduce risk of falls? Yes   ASSISTIVE DEVICES UTILIZED TO PREVENT FALLS:  Life alert? Yes  Use of a cane, walker or w/c? No  Grab bars in the bathroom? No  Shower chair or bench in shower? Yes  Elevated toilet seat or a handicapped toilet? No   DME ORDERS:  DME order needed?  No   TIMED UP AND GO:  Was the test performed? Yes .  Length of time to ambulate 10 feet: 6 sec.   GAIT:  Appearance of gait: Gait stead-fast and without the use of an assistive device.   Education: Fall risk prevention has been discussed.  Intervention(s) required? No    Depression Screen PHQ 2/9 Scores 06/12/2018 01/29/2018 09/04/2017 09/04/2017  PHQ - 2 Score 0 0 0 0  PHQ- 9 Score - 0 0 -    Cognitive Function     6CIT Screen 06/12/2018 06/11/2017  What Year? 0 points 0 points  What month? 0 points 0 points  What time? 0 points 0 points  Count back from 20 0 points 0 points  Months in reverse 2 points 0 points  Repeat phrase 2 points 2 points  Total Score 4 2    Immunization History  Administered Date(s) Administered  . Influenza, High Dose Seasonal PF 12/12/2016, 01/29/2018  . Influenza,inj,Quad PF,6+ Mos 12/16/2014, 12/15/2015  . Influenza-Unspecified 12/22/2011, 01/15/2013  . Pneumococcal Conjugate-13 08/22/2016  . Pneumococcal Polysaccharide-23 12/22/2011, 12/16/2014  . Tdap 06/22/2017  . Zoster Recombinat (Shingrix) 03/06/2018, 05/13/2018    Qualifies for Shingles Vaccine? Yes  Shingrix completed 05/13/18.   Tdap: Up to  date  Flu Vaccine: Up to date  Pneumococcal Vaccine: Up to date   Screening Tests Health Maintenance  Topic Date Due  . Hepatitis C Screening  09/05/2018 (Originally Apr 13, 1945)  . TETANUS/TDAP  06/23/2027  . COLONOSCOPY  07/12/2027  . INFLUENZA VACCINE  Completed  . PNA vac Low Risk Adult  Completed   Cancer Screenings:  Colorectal Screening: Completed 07/11/17. Repeat every 10 years.  Lung Cancer Screening: (Low Dose CT Chest recommended if Age 49-80 years, 30 pack-year currently smoking OR have quit w/in 15years.) does not qualify.   Additional Screening:  Hepatitis C Screening: does qualify; Completed postponed  Vision Screening: Recommended annual ophthalmology exams for early detection of glaucoma and other disorders of the eye. Is the patient up to date with their annual eye exam?  Yes  Who is the provider or what is the name of the office in which the pt attends annual eye exams? Dr. Wyatt Portela .  Dental Screening: Recommended annual dental exams for proper oral hygiene  Community Resource Referral:  CRR required this visit?  No       Plan:    I have personally reviewed and addressed the Medicare Annual Wellness questionnaire and have noted the following in the patient's chart:  A. Medical and social history B. Use of alcohol, tobacco or illicit drugs  C. Current medications and supplements D. Functional ability and status E.  Nutritional status F.  Physical activity G. Advance directives H. List of other physicians I.  Hospitalizations, surgeries, and ER visits in previous 12 months J.  [place] such as hearing and vision if needed, cognitive and depression L. Referrals and appointments   In addition, I have reviewed and discussed with patient certain preventive protocols, quality metrics, and best practice recommendations. A written personalized care plan for preventive services as well as general preventive health  recommendations were provided to  patient.   Signed,  Clemetine Marker, LPN Nurse Health Advisor   Nurse Notes: none

## 2018-06-13 ENCOUNTER — Ambulatory Visit: Payer: Self-pay

## 2018-09-11 ENCOUNTER — Encounter: Payer: Self-pay | Admitting: Family Medicine

## 2018-09-11 ENCOUNTER — Other Ambulatory Visit: Payer: Self-pay

## 2018-09-11 ENCOUNTER — Ambulatory Visit (INDEPENDENT_AMBULATORY_CARE_PROVIDER_SITE_OTHER): Payer: Medicare Other | Admitting: Family Medicine

## 2018-09-11 VITALS — BP 120/78 | HR 64 | Ht 66.0 in | Wt 204.0 lb

## 2018-09-11 DIAGNOSIS — R69 Illness, unspecified: Secondary | ICD-10-CM | POA: Diagnosis not present

## 2018-09-11 DIAGNOSIS — I25810 Atherosclerosis of coronary artery bypass graft(s) without angina pectoris: Secondary | ICD-10-CM

## 2018-09-11 DIAGNOSIS — M5116 Intervertebral disc disorders with radiculopathy, lumbar region: Secondary | ICD-10-CM

## 2018-09-11 DIAGNOSIS — E785 Hyperlipidemia, unspecified: Secondary | ICD-10-CM | POA: Diagnosis not present

## 2018-09-11 DIAGNOSIS — K219 Gastro-esophageal reflux disease without esophagitis: Secondary | ICD-10-CM | POA: Diagnosis not present

## 2018-09-11 DIAGNOSIS — I10 Essential (primary) hypertension: Secondary | ICD-10-CM | POA: Diagnosis not present

## 2018-09-11 MED ORDER — GABAPENTIN 100 MG PO CAPS: 100.0000 mg | ORAL_CAPSULE | Freq: Two times a day (BID) | ORAL | 3 refills | Status: DC

## 2018-09-11 MED ORDER — ATORVASTATIN CALCIUM 80 MG PO TABS: 80.0000 mg | ORAL_TABLET | Freq: Every day | ORAL | 1 refills | Status: DC

## 2018-09-11 MED ORDER — METOPROLOL TARTRATE 25 MG PO TABS: ORAL_TABLET | ORAL | 1 refills | Status: DC

## 2018-09-11 MED ORDER — ETODOLAC 200 MG PO CAPS: 200.0000 mg | ORAL_CAPSULE | Freq: Two times a day (BID) | ORAL | 3 refills | Status: DC

## 2018-09-11 MED ORDER — PANTOPRAZOLE SODIUM 40 MG PO TBEC: 40.0000 mg | DELAYED_RELEASE_TABLET | Freq: Every day | ORAL | 1 refills | Status: DC

## 2018-09-11 NOTE — Patient Instructions (Signed)

## 2018-09-11 NOTE — Progress Notes (Signed)
Date:  09/11/2018   Name:  Matthew Humphrey   DOB:  1945/04/22   MRN:  [id number]   Chief Complaint: Hypertension; Hyperlipidemia; Gastroesophageal Reflux; and Joint Swelling (x 1 week)  Hypertension  This is a chronic problem. The current episode started more than 1 year ago. The problem is unchanged. The problem is controlled. Pertinent negatives include no anxiety, blurred vision, chest pain, headaches, malaise/fatigue, neck pain, orthopnea, palpitations, peripheral edema, PND, shortness of breath or sweats. (Ankle swe) There are no associated agents to hypertension. Risk factors for coronary artery disease include dyslipidemia and obesity. Past treatments include beta blockers. The current treatment provides moderate improvement. There are no compliance problems.  Hypertensive end-organ damage includes CAD/MI. There is no history of angina, kidney disease, CVA, heart failure, left ventricular hypertrophy, PVD or retinopathy. There is no history of chronic renal disease, a hypertension causing med or renovascular disease.  Hyperlipidemia  This is a chronic problem. The current episode started more than 1 year ago. The problem is controlled. Recent lipid tests were reviewed and are normal. Exacerbating diseases include obesity. He has no history of chronic renal disease, diabetes, hypothyroidism, liver disease or nephrotic syndrome. There are no known factors aggravating his hyperlipidemia. Pertinent negatives include no chest pain, focal sensory loss, focal weakness, leg pain, myalgias or shortness of breath. Current antihyperlipidemic treatment includes statins. The current treatment provides moderate improvement of lipids. There are no compliance problems.  Risk factors for coronary artery disease include dyslipidemia, hypertension and male sex.  Gastroesophageal Reflux  He reports no abdominal pain, no belching, no chest pain, no choking, no coughing, no dysphagia, no early satiety, no globus  sensation, no heartburn, no hoarse voice, no nausea, no sore throat, no stridor, no tooth decay, no water brash or no wheezing. This is a chronic problem. The current episode started more than 1 year ago. The problem has been gradually improving. Nothing aggravates the symptoms. Pertinent negatives include no anemia, fatigue, melena, muscle weakness, orthopnea or weight loss. The treatment provided moderate relief.  Back Pain  This is a recurrent problem. The current episode started 1 to 4 weeks ago. The problem has been gradually worsening since onset. The pain is present in the lumbar spine. The pain radiates to the left thigh. The pain is mild. Pertinent negatives include no abdominal pain, chest pain, dysuria, fever, headaches, leg pain or weight loss.    Review of Systems  Constitutional: Negative for chills, fatigue, fever, malaise/fatigue and weight loss.  HENT: Negative for drooling, ear discharge, ear pain, hoarse voice and sore throat.   Eyes: Negative for blurred vision.  Respiratory: Negative for cough, choking, shortness of breath and wheezing.   Cardiovascular: Positive for leg swelling. Negative for chest pain, palpitations, orthopnea and PND.  Gastrointestinal: Negative for abdominal pain, blood in stool, constipation, diarrhea, dysphagia, heartburn, melena and nausea.  Endocrine: Negative for polydipsia.  Genitourinary: Negative for dysuria, frequency, hematuria and urgency.  Musculoskeletal: Negative for back pain, myalgias, muscle weakness and neck pain.  Skin: Negative for rash.  Allergic/Immunologic: Negative for environmental allergies.  Neurological: Negative for dizziness, focal weakness and headaches.  Hematological: Does not bruise/bleed easily.  Psychiatric/Behavioral: Negative for suicidal ideas. The patient is not nervous/anxious.     Patient Active Problem List   Diagnosis Date Noted  . Chronic kidney disease 06/12/2018  . Mediastinitis 06/12/2018  . MRSA  infection 06/12/2018  . Peptic ulcer 06/12/2018  . Vertebral osteomyelitis ([place]) 06/12/2018  . Coronary artery  disease involving autologous vein coronary bypass graft without angina pectoris 09/04/2017  . Cervical disc disease 09/04/2017  . Special screening for malignant neoplasms, colon   . Obesity (BMI 30.0-34.9) 12/20/2016  . Primary osteoarthritis of right knee 12/20/2016  . Primary osteoarthritis of both knees 08/22/2016  . Radicular leg pain 12/15/2015  . Essential hypertension 12/16/2014  . Hyperlipidemia 12/16/2014  . Esophageal reflux 12/16/2014  . Spinal stenosis in cervical region 11/27/2012  . Hx of colonoscopy with polypectomy 12/21/2011  . Impaired fasting glucose 08/29/2011    Allergies  Allergen Reactions  . Sulfa Antibiotics Other (See Comments)  . Sulfamethoxazole-Trimethoprim Other (See Comments)    Other reaction(s): Unknown Other reaction(s): Unknown   . Sulfasalazine Other (See Comments)    Past Surgical History:  Procedure Laterality Date  . CARDIAC SURGERY  03/2010   bypass  . CERVICAL FUSION  12/11/2012   C4-5,C5-6,C6-7  . COLONOSCOPY  2011 ?  . COLONOSCOPY WITH PROPOFOL N/A 07/11/2017   Procedure: COLONOSCOPY WITH PROPOFOL;  Surgeon: Lin Landsman, MD;  Location: [facility];  Service: Endoscopy;  Laterality: N/A;  . POLYPECTOMY N/A 07/11/2017   Procedure: POLYPECTOMY;  Surgeon: Lin Landsman, MD;  Location: [facility];  Service: Endoscopy;  Laterality: N/A;    Social History   Tobacco Use  . Smoking status: Former Smoker    Packs/day: 1.50    Years: 30.00    Pack years: 45.00    Types: Cigarettes    Last attempt to quit: 1997    Years since quitting: 23.4  . Smokeless tobacco: Former Systems developer    Types: Chew  . Tobacco comment: smoking cessation materials not required  Substance Use Topics  . Alcohol use: No    Alcohol/week: 0.0 standard drinks  . Drug use: No     Medication list has been reviewed and  updated.  Current Meds  Medication Sig  . aspirin EC 81 MG tablet Take 1 tablet (81 mg total) by mouth daily.  Marland Kitchen atorvastatin (LIPITOR) 80 MG tablet Take 1 tablet (80 mg total) by mouth daily.  . metoprolol tartrate (LOPRESSOR) 25 MG tablet TAKE ONE-HALF TABLET BY  MOUTH TWICE A DAY  . pantoprazole (PROTONIX) 40 MG tablet Take 1 tablet (40 mg total) by mouth daily.    PHQ 2/9 Scores 09/11/2018 06/12/2018 01/29/2018 09/04/2017  PHQ - 2 Score 0 0 0 0  PHQ- 9 Score 0 - 0 0    BP Readings from Last 3 Encounters:  09/11/18 120/78  06/12/18 110/76  04/24/18 120/60    Physical Exam Vitals signs and nursing note reviewed.  HENT:     Head: Normocephalic.     Right Ear: Tympanic membrane, ear canal and external ear normal.     Left Ear: Tympanic membrane, ear canal and external ear normal.     Nose: Nose normal.  Eyes:     General: No scleral icterus.       Right eye: No discharge.        Left eye: No discharge.     Conjunctiva/sclera: Conjunctivae normal.     Pupils: Pupils are equal, round, and reactive to light.  Neck:     Musculoskeletal: Normal range of motion and neck supple.     Thyroid: No thyromegaly.     Vascular: No JVD.     Trachea: No tracheal deviation.  Cardiovascular:     Rate and Rhythm: Normal rate and regular rhythm.     Pulses: No decreased pulses.  Heart sounds: Normal heart sounds, S1 normal and S2 normal. No murmur. No systolic murmur. No diastolic murmur. No friction rub. No gallop. No S3 or S4 sounds.   Pulmonary:     Effort: No respiratory distress.     Breath sounds: Normal breath sounds. No decreased breath sounds, wheezing, rhonchi or rales.  Abdominal:     General: Abdomen is protuberant. Bowel sounds are normal.     Palpations: Abdomen is soft. There is no mass.     Tenderness: There is no abdominal tenderness. There is no guarding or rebound.  Musculoskeletal: Normal range of motion.        General: No tenderness.     Right lower leg: 1+  Pitting Edema present.     Left lower leg: 1+ Pitting Edema present.  Lymphadenopathy:     Cervical: No cervical adenopathy.  Skin:    General: Skin is warm.     Findings: No rash.  Neurological:     Mental Status: He is alert and oriented to person, place, and time.     Cranial Nerves: No cranial nerve deficit.     Deep Tendon Reflexes: Reflexes are normal and symmetric.     Wt Readings from Last 3 Encounters:  09/11/18 204 lb (92.5 kg)  06/12/18 201 lb 3.2 oz (91.3 kg)  04/24/18 199 lb (90.3 kg)    BP 120/78   Pulse 64   Ht 5\' 6"  (1.676 m)   Wt 204 lb (92.5 kg)   BMI 32.93 kg/m   Assessment and Plan: 1. Essential hypertension Chronic.  Controlled.  Continue metoprolol 25 mg 1/2 tablet twice a day. - metoprolol tartrate (LOPRESSOR) 25 MG tablet; TAKE ONE-HALF TABLET BY  MOUTH TWICE A DAY  Dispense: 90 tablet; Refill: 1  2. Coronary artery disease involving autologous vein coronary bypass graft without angina pectoris Coronary artery disease with coronary bypass grafts.  Continue metoprolol 1/2 tablet twice a day and lowering therapy with Lipitor 80 mg once a day. - metoprolol tartrate (LOPRESSOR) 25 MG tablet; TAKE ONE-HALF TABLET BY  MOUTH TWICE A DAY  Dispense: 90 tablet; Refill: 1 - atorvastatin (LIPITOR) 80 MG tablet; Take 1 tablet (80 mg total) by mouth daily.  Dispense: 90 tablet; Refill: 1  3. Mild hyperlipidemia .  Controlled.  Continue atorvastatin 80 mg once a day and low-cholesterol low triglyceride diet provided. - atorvastatin (LIPITOR) 80 MG tablet; Take 1 tablet (80 mg total) by mouth daily.  Dispense: 90 tablet; Refill: 1 - Hepatic Function Panel (6)  4. Gastroesophageal reflux disease, esophagitis presence not specified Chronic.  Controlled.  Continue pantoprazole 40 mg once a day. - pantoprazole (PROTONIX) 40 MG tablet; Take 1 tablet (40 mg total) by mouth daily.  Dispense: 90 tablet; Refill: 1  5. Lumbar disc disease with radiculopathy Episodic.   Recurrent.  Will initiate Lodine 200 mg twice a day and gabapentin Neurontin 100 mg twice a day for radiculopathy. - etodolac (LODINE) 200 MG capsule; Take 1 capsule (200 mg total) by mouth 2 (two) times daily.  Dispense: 60 capsule; Refill: 3 - gabapentin (NEURONTIN) 100 MG capsule; Take 1 capsule (100 mg total) by mouth 2 (two) times daily.  Dispense: 60 capsule; Refill: 3  6. Taking medication for chronic disease Patient on statin therapy and will check hepatic function panel to evaluate for hepatotoxicity. - Hepatic Function Panel (6)

## 2018-09-12 LAB — HEPATIC FUNCTION PANEL (6)
ALT: 35 IU/L (ref 0–44)
AST: 49 IU/L — ABNORMAL HIGH (ref 0–40)
Albumin: 4.1 g/dL (ref 3.7–4.7)
Alkaline Phosphatase: 89 IU/L (ref 39–117)
Bilirubin Total: 0.4 mg/dL (ref 0.0–1.2)
Bilirubin, Direct: 0.14 mg/dL (ref 0.00–0.40)

## 2018-09-13 NOTE — Addendum Note (Signed)
Addended by: Fredderick Severance on: 09/13/2018 08:40 AM   Modules accepted: Orders

## 2018-09-18 ENCOUNTER — Other Ambulatory Visit: Payer: Self-pay | Admitting: Orthopedic Surgery

## 2018-09-18 DIAGNOSIS — M5416 Radiculopathy, lumbar region: Secondary | ICD-10-CM

## 2018-09-18 DIAGNOSIS — M5136 Other intervertebral disc degeneration, lumbar region: Secondary | ICD-10-CM | POA: Diagnosis not present

## 2018-09-18 DIAGNOSIS — G8929 Other chronic pain: Secondary | ICD-10-CM

## 2018-09-18 DIAGNOSIS — M48062 Spinal stenosis, lumbar region with neurogenic claudication: Secondary | ICD-10-CM

## 2018-09-18 DIAGNOSIS — M5442 Lumbago with sciatica, left side: Secondary | ICD-10-CM | POA: Diagnosis not present

## 2018-10-01 ENCOUNTER — Other Ambulatory Visit: Payer: Self-pay

## 2018-10-01 ENCOUNTER — Ambulatory Visit
Admission: RE | Admit: 2018-10-01 | Discharge: 2018-10-01 | Disposition: A | Discharge status: Home / Self Care | Payer: Medicare Other | Source: Ambulatory Visit | Attending: Orthopedic Surgery | Admitting: Orthopedic Surgery

## 2018-10-01 DIAGNOSIS — M5441 Lumbago with sciatica, right side: Secondary | ICD-10-CM | POA: Diagnosis not present

## 2018-10-01 DIAGNOSIS — M5442 Lumbago with sciatica, left side: Secondary | ICD-10-CM | POA: Diagnosis not present

## 2018-10-01 DIAGNOSIS — G8929 Other chronic pain: Secondary | ICD-10-CM | POA: Insufficient documentation

## 2018-10-01 DIAGNOSIS — M48062 Spinal stenosis, lumbar region with neurogenic claudication: Secondary | ICD-10-CM | POA: Insufficient documentation

## 2018-10-01 DIAGNOSIS — M5416 Radiculopathy, lumbar region: Secondary | ICD-10-CM | POA: Diagnosis not present

## 2018-10-01 DIAGNOSIS — M48061 Spinal stenosis, lumbar region without neurogenic claudication: Secondary | ICD-10-CM | POA: Diagnosis not present

## 2018-11-26 DIAGNOSIS — M5136 Other intervertebral disc degeneration, lumbar region: Secondary | ICD-10-CM | POA: Diagnosis not present

## 2018-11-26 DIAGNOSIS — M48062 Spinal stenosis, lumbar region with neurogenic claudication: Secondary | ICD-10-CM | POA: Diagnosis not present

## 2018-11-26 DIAGNOSIS — M5416 Radiculopathy, lumbar region: Secondary | ICD-10-CM | POA: Diagnosis not present

## 2019-01-09 DIAGNOSIS — M5136 Other intervertebral disc degeneration, lumbar region: Secondary | ICD-10-CM | POA: Diagnosis not present

## 2019-01-09 DIAGNOSIS — M48062 Spinal stenosis, lumbar region with neurogenic claudication: Secondary | ICD-10-CM | POA: Diagnosis not present

## 2019-01-09 DIAGNOSIS — M5416 Radiculopathy, lumbar region: Secondary | ICD-10-CM | POA: Diagnosis not present

## 2019-03-05 ENCOUNTER — Ambulatory Visit (INDEPENDENT_AMBULATORY_CARE_PROVIDER_SITE_OTHER): Payer: Medicare Other

## 2019-03-05 ENCOUNTER — Other Ambulatory Visit: Payer: Self-pay

## 2019-03-05 DIAGNOSIS — Z23 Encounter for immunization: Secondary | ICD-10-CM

## 2019-03-13 ENCOUNTER — Encounter: Payer: Self-pay | Admitting: Family Medicine

## 2019-03-13 ENCOUNTER — Ambulatory Visit (INDEPENDENT_AMBULATORY_CARE_PROVIDER_SITE_OTHER): Payer: Medicare Other | Admitting: Family Medicine

## 2019-03-13 ENCOUNTER — Other Ambulatory Visit: Payer: Self-pay

## 2019-03-13 VITALS — BP 118/50 | HR 70 | Ht 66.0 in | Wt 199.0 lb

## 2019-03-13 DIAGNOSIS — Z1159 Encounter for screening for other viral diseases: Secondary | ICD-10-CM

## 2019-03-13 DIAGNOSIS — M5116 Intervertebral disc disorders with radiculopathy, lumbar region: Secondary | ICD-10-CM

## 2019-03-13 DIAGNOSIS — E785 Hyperlipidemia, unspecified: Secondary | ICD-10-CM | POA: Diagnosis not present

## 2019-03-13 DIAGNOSIS — I25810 Atherosclerosis of coronary artery bypass graft(s) without angina pectoris: Secondary | ICD-10-CM | POA: Diagnosis not present

## 2019-03-13 DIAGNOSIS — K219 Gastro-esophageal reflux disease without esophagitis: Secondary | ICD-10-CM

## 2019-03-13 DIAGNOSIS — I10 Essential (primary) hypertension: Secondary | ICD-10-CM | POA: Diagnosis not present

## 2019-03-13 DIAGNOSIS — M4620 Osteomyelitis of vertebra, site unspecified: Secondary | ICD-10-CM

## 2019-03-13 MED ORDER — ATORVASTATIN CALCIUM 80 MG PO TABS: 80.0000 mg | ORAL_TABLET | Freq: Every day | ORAL | 1 refills | Status: DC

## 2019-03-13 MED ORDER — GABAPENTIN 100 MG PO CAPS: 100.0000 mg | ORAL_CAPSULE | Freq: Two times a day (BID) | ORAL | 1 refills | Status: DC

## 2019-03-13 MED ORDER — METOPROLOL TARTRATE 25 MG PO TABS: ORAL_TABLET | ORAL | 1 refills | Status: DC

## 2019-03-13 MED ORDER — PANTOPRAZOLE SODIUM 40 MG PO TBEC: 40.0000 mg | DELAYED_RELEASE_TABLET | Freq: Every day | ORAL | 1 refills | Status: DC

## 2019-03-13 NOTE — Progress Notes (Signed)
Date:  03/13/2019   Name:  Matthew Humphrey   DOB:  July 12, 1945   MRN:  [id number]   Chief Complaint: Gastroesophageal Reflux, Peripheral Neuropathy, Hypertension, and Hyperlipidemia  Gastroesophageal Reflux He reports no abdominal pain, no belching, no chest pain, no choking, no coughing, no dysphagia, no early satiety, no globus sensation, no heartburn, no hoarse voice, no nausea, no sore throat, no stridor or no wheezing. neuropathy. This is a chronic problem. The current episode started more than 1 year ago. The problem occurs rarely. The problem has been gradually improving. Nothing aggravates the symptoms. He has tried a PPI for the symptoms. The treatment provided moderate relief.  Hypertension This is a chronic problem. The current episode started more than 1 year ago. The problem has been gradually improving since onset. Pertinent negatives include no anxiety, blurred vision, chest pain, headaches, malaise/fatigue, neck pain, orthopnea, palpitations, peripheral edema, PND, shortness of breath or sweats. There are no associated agents to hypertension. Past treatments include beta blockers. The current treatment provides moderate improvement. There are no compliance problems.  There is no history of angina, kidney disease, CAD/MI, CVA, heart failure, left ventricular hypertrophy, PVD or retinopathy. There is no history of chronic renal disease, a hypertension causing med or renovascular disease.  Hyperlipidemia This is a chronic problem. The current episode started more than 1 year ago. The problem is controlled. Recent lipid tests were reviewed and are normal. Exacerbating diseases include obesity. He has no history of chronic renal disease, diabetes, hypothyroidism, liver disease or nephrotic syndrome. There are no known factors aggravating his hyperlipidemia. Pertinent negatives include no chest pain, focal sensory loss, focal weakness, myalgias or shortness of breath. Current  antihyperlipidemic treatment includes statins. The current treatment provides moderate improvement of lipids. There are no compliance problems.  Risk factors for coronary artery disease include dyslipidemia, hypertension, male sex and obesity.  Neurologic Problem The patient's pertinent negatives include no altered mental status, clumsiness, focal sensory loss, focal weakness, loss of balance, memory loss, near-syncope, slurred speech, syncope, visual change or weakness. Primary symptoms comment: neuropathy. This is a chronic problem. The current episode started more than 1 year ago. The problem has been waxing and waning since onset. There was left-sided, right-sided and lower extremity focality noted. Pertinent negatives include no abdominal pain, back pain, chest pain, dizziness, fever, headaches, nausea, neck pain, palpitations or shortness of breath. There is no history of liver disease.  Heart Problem This is a chronic ("blockage") problem. The current episode started more than 1 year ago. Episode frequency: none. The problem has been gradually improving. Pertinent negatives include no abdominal pain, chest pain, chills, coughing, fever, headaches, myalgias, nausea, neck pain, rash, sore throat, visual change or weakness. Nothing aggravates the symptoms. Treatments tried: metoprolol. The treatment provided moderate relief.    Lab Results  Component Value Date   CREATININE 1.70 (H) 03/12/2018   BUN 20 03/12/2018   NA 141 03/12/2018   K 5.0 03/12/2018   CL 104 03/12/2018   CO2 24 03/12/2018   Lab Results  Component Value Date   CHOL 165 03/12/2018   HDL 42 03/12/2018   LDLCALC 101 (H) 03/12/2018   TRIG 108 03/12/2018   CHOLHDL 3.9 03/12/2018   No results found for: TSH No results found for: HGBA1C   Review of Systems  Constitutional: Negative for chills, fever and malaise/fatigue.  HENT: Negative for drooling, ear discharge, ear pain, hoarse voice and sore throat.   Eyes: Negative  for blurred  vision.  Respiratory: Negative for apnea, cough, choking, chest tightness, shortness of breath, wheezing and stridor.   Cardiovascular: Negative for chest pain, palpitations, orthopnea, leg swelling, PND and near-syncope.  Gastrointestinal: Negative for abdominal pain, blood in stool, constipation, diarrhea, dysphagia, heartburn and nausea.  Endocrine: Negative for polydipsia.  Genitourinary: Negative for dysuria, frequency, hematuria and urgency.  Musculoskeletal: Negative for back pain, myalgias and neck pain.  Skin: Negative for rash.  Allergic/Immunologic: Negative for environmental allergies.  Neurological: Negative for dizziness, focal weakness, syncope, weakness, headaches and loss of balance.  Hematological: Does not bruise/bleed easily.  Psychiatric/Behavioral: Negative for memory loss and suicidal ideas. The patient is not nervous/anxious.     Patient Active Problem List   Diagnosis Date Noted  . Chronic kidney disease 06/12/2018  . Mediastinitis 06/12/2018  . MRSA infection 06/12/2018  . Peptic ulcer 06/12/2018  . Vertebral osteomyelitis ([place]) 06/12/2018  . Coronary artery disease involving autologous vein coronary bypass graft without angina pectoris 09/04/2017  . Cervical disc disease 09/04/2017  . Special screening for malignant neoplasms, colon   . Obesity (BMI 30.0-34.9) 12/20/2016  . Primary osteoarthritis of right knee 12/20/2016  . Primary osteoarthritis of both knees 08/22/2016  . Radicular leg pain 12/15/2015  . Essential hypertension 12/16/2014  . Hyperlipidemia 12/16/2014  . Esophageal reflux 12/16/2014  . Spinal stenosis in cervical region 11/27/2012  . Hx of colonoscopy with polypectomy 12/21/2011  . Impaired fasting glucose 08/29/2011    Allergies  Allergen Reactions  . Sulfa Antibiotics Other (See Comments)  . Sulfamethoxazole-Trimethoprim Other (See Comments)    Other reaction(s): Unknown Other reaction(s): Unknown   . Sulfasalazine  Other (See Comments)    Past Surgical History:  Procedure Laterality Date  . CARDIAC SURGERY  03/2010   bypass  . CERVICAL FUSION  12/11/2012   C4-5,C5-6,C6-7  . COLONOSCOPY  2011 ?  . COLONOSCOPY WITH PROPOFOL N/A 07/11/2017   Procedure: COLONOSCOPY WITH PROPOFOL;  Surgeon: Lin Landsman, MD;  Location: [facility];  Service: Endoscopy;  Laterality: N/A;  . POLYPECTOMY N/A 07/11/2017   Procedure: POLYPECTOMY;  Surgeon: Lin Landsman, MD;  Location: [facility];  Service: Endoscopy;  Laterality: N/A;    Social History   Tobacco Use  . Smoking status: Former Smoker    Packs/day: 1.50    Years: 30.00    Pack years: 45.00    Types: Cigarettes    Quit date: 1997    Years since quitting: 23.9  . Smokeless tobacco: Former Systems developer    Types: Chew  . Tobacco comment: smoking cessation materials not required  Substance Use Topics  . Alcohol use: No    Alcohol/week: 0.0 standard drinks  . Drug use: No     Medication list has been reviewed and updated.  Current Meds  Medication Sig  . aspirin EC 81 MG tablet Take 1 tablet (81 mg total) by mouth daily.  Marland Kitchen atorvastatin (LIPITOR) 80 MG tablet Take 1 tablet (80 mg total) by mouth daily.  Marland Kitchen etodolac (LODINE) 200 MG capsule Take 1 capsule (200 mg total) by mouth 2 (two) times daily.  Marland Kitchen gabapentin (NEURONTIN) 100 MG capsule Take 1 capsule (100 mg total) by mouth 2 (two) times daily.  . metoprolol tartrate (LOPRESSOR) 25 MG tablet TAKE ONE-HALF TABLET BY  MOUTH TWICE A DAY  . pantoprazole (PROTONIX) 40 MG tablet Take 1 tablet (40 mg total) by mouth daily.    PHQ 2/9 Scores 03/13/2019 09/11/2018 06/12/2018 01/29/2018  PHQ - 2 Score 0  0 0 0  PHQ- 9 Score 0 0 - 0    BP Readings from Last 3 Encounters:  03/13/19 (!) 118/50  09/11/18 120/78  06/12/18 110/76    Physical Exam Vitals and nursing note reviewed.     Wt Readings from Last 3 Encounters:  03/13/19 199 lb (90.3 kg)  09/11/18 204 lb (92.5 kg)   06/12/18 201 lb 3.2 oz (91.3 kg)    BP (!) 118/50   Pulse 70   Ht 5\' 6"  (1.676 m)   Wt 199 lb (90.3 kg)   SpO2 97%   BMI 32.12 kg/m   Assessment and Plan:  1. Coronary artery disease involving autologous vein coronary bypass graft without angina pectoris Chronic.  Controlled.  Stable.  Patient was told at some point in time that he had a blockage but during this time that this is been controlled with cholesterol management and metoprolol for blood pressure and cardiac measures.  We will continue metoprolol 25 mg 1 twice a day. - atorvastatin (LIPITOR) 80 MG tablet; Take 1 tablet (80 mg total) by mouth daily.  Dispense: 90 tablet; Refill: 1 - metoprolol tartrate (LOPRESSOR) 25 MG tablet; TAKE ONE-HALF TABLET BY  MOUTH TWICE A DAY  Dispense: 90 tablet; Refill: 1  2. Mild hyperlipidemia Chronic.  Controlled.  Stable.  Continue lipid panel and renal panel.  We will continue patient on atorvastatin 80 mg once a day. - Lipid Panel With LDL/HDL Ratio - Renal Function Panel - atorvastatin (LIPITOR) 80 MG tablet; Take 1 tablet (80 mg total) by mouth daily.  Dispense: 90 tablet; Refill: 1  3. Lumbar disc disease with radiculopathy Patient has a history of lumbar disc disease with radiculopathy.  Pain is controlled with Neurontin 100 mg 1 twice a day. - gabapentin (NEURONTIN) 100 MG capsule; Take 1 capsule (100 mg total) by mouth 2 (two) times daily.  Dispense: 180 capsule; Refill: 1  4. Essential hypertension Controlled.  Chronic.  Stable.  Continue metoprolol 25 mg twice a day will check renal function. - metoprolol tartrate (LOPRESSOR) 25 MG tablet; TAKE ONE-HALF TABLET BY  MOUTH TWICE A DAY  Dispense: 90 tablet; Refill: 1  5. Gastroesophageal reflux disease Chronic.  Controlled.  Stable.  Continue pantoprazole 40 mg once a day. - pantoprazole (PROTONIX) 40 MG tablet; Take 1 tablet (40 mg total) by mouth daily.  Dispense: 90 tablet; Refill: 1  6. Need for hepatitis C screening test  Patient is an age group for concern for hepatitis C exposure.  Will check hepatitis C antibody. - Hepatitis C antibody

## 2019-03-14 LAB — LIPID PANEL WITH LDL/HDL RATIO
Cholesterol, Total: 134 mg/dL (ref 100–199)
HDL: 42 mg/dL (ref >39)
LDL Chol Calc (NIH): 76 mg/dL (ref 0–99)
LDL/HDL Ratio: 1.8 ratio (ref 0.0–3.6)
Triglycerides: 83 mg/dL (ref 0–149)
VLDL Cholesterol Cal: 16 mg/dL (ref 5–40)

## 2019-03-14 LAB — RENAL FUNCTION PANEL
Albumin: 4.1 g/dL (ref 3.7–4.7)
BUN/Creatinine Ratio: 16 (ref 10–24)
BUN: 26 mg/dL (ref 8–27)
CO2: 23 mmol/L (ref 20–29)
Calcium: 9.8 mg/dL (ref 8.6–10.2)
Chloride: 106 mmol/L (ref 96–106)
Creatinine, Ser: 1.59 mg/dL — ABNORMAL HIGH (ref 0.76–1.27)
GFR calc Af Amer: 49 mL/min/{1.73_m2} — ABNORMAL LOW (ref >59)
GFR calc non Af Amer: 43 mL/min/{1.73_m2} — ABNORMAL LOW (ref >59)
Glucose: 81 mg/dL (ref 65–99)
Phosphorus: 2.7 mg/dL — ABNORMAL LOW (ref 2.8–4.1)
Potassium: 4.9 mmol/L (ref 3.5–5.2)
Sodium: 144 mmol/L (ref 134–144)

## 2019-03-14 LAB — HEPATITIS C ANTIBODY: Hep C Virus Ab: <0.1 s/co ratio (ref 0.0–0.9)

## 2019-06-18 ENCOUNTER — Other Ambulatory Visit: Payer: Self-pay

## 2019-06-18 ENCOUNTER — Ambulatory Visit (INDEPENDENT_AMBULATORY_CARE_PROVIDER_SITE_OTHER): Payer: Medicare Other

## 2019-06-18 VITALS — BP 112/66 | HR 66 | Temp 97.1°F | Resp 16 | Ht 66.0 in | Wt 197.8 lb

## 2019-06-18 DIAGNOSIS — Z598 Other problems related to housing and economic circumstances: Secondary | ICD-10-CM | POA: Diagnosis not present

## 2019-06-18 DIAGNOSIS — Z0000 Encounter for general adult medical examination without abnormal findings: Secondary | ICD-10-CM

## 2019-06-18 DIAGNOSIS — Z599 Problem related to housing and economic circumstances, unspecified: Secondary | ICD-10-CM

## 2019-06-18 NOTE — Patient Instructions (Signed)
Matthew Humphrey , Thank you for taking time to come for your Medicare Wellness Visit. I appreciate your ongoing commitment to your health goals. Please review the following plan we discussed and let me know if I can assist you in the future.   Screening recommendations/referrals: Colonoscopy: done 07/11/17 Recommended yearly ophthalmology/optometry visit for glaucoma screening and checkup Recommended yearly dental visit for hygiene and checkup  Vaccinations: Influenza vaccine: done 03/05/19 Pneumococcal vaccine: done 08/22/16 Tdap vaccine: done 06/22/17 Shingles vaccine: done 05/13/18    Covid-19: Please call to schedule an appointment for your Covid vaccine  [facility] COVID vaccine line [place]  Advanced directives: Advance directive discussed with you today. I have provided a copy for you to complete at home and have notarized. Once this is complete please bring a copy in to our office so we can scan it into your chart.  Conditions/risks identified: Recommend drinking 6-8 glasses of water per day  Next appointment: Please follow up in one year for your Medicare Annual Wellness visit.    Preventive Care 59 Years and Older, Male Preventive care refers to lifestyle choices and visits with your health care provider that can promote health and wellness. What does preventive care include?  A yearly physical exam. This is also called an annual well check.  Dental exams once or twice a year.  Routine eye exams. Ask your health care provider how often you should have your eyes checked.  Personal lifestyle choices, including:  Daily care of your teeth and gums.  Regular physical activity.  Eating a healthy diet.  Avoiding tobacco and drug use.  Limiting alcohol use.  Practicing safe sex.  Taking low doses of aspirin every day.  Taking vitamin and mineral supplements as recommended by your health care  provider. What happens during an annual well check? The services and screenings done by your health care provider during your annual well check will depend on your age, overall health, lifestyle risk factors, and family history of disease. Counseling  Your health care provider may ask you questions about your:  Alcohol use.  Tobacco use.  Drug use.  Emotional well-being.  Home and relationship well-being.  Sexual activity.  Eating habits.  History of falls.  Memory and ability to understand (cognition).  Work and work Statistician. Screening  You may have the following tests or measurements:  Height, weight, and BMI.  Blood pressure.  Lipid and cholesterol levels. These may be checked every 5 years, or more frequently if you are over 51 years old.  Skin check.  Lung cancer screening. You may have this screening every year starting at age 73 if you have a 30-pack-year history of smoking and currently smoke or have quit within the past 15 years.  Fecal occult blood test (FOBT) of the stool. You may have this test every year starting at age 73.  Flexible sigmoidoscopy or colonoscopy. You may have a sigmoidoscopy every 5 years or a colonoscopy every 10 years starting at age 73.  Prostate cancer screening. Recommendations will vary depending on your family history and other risks.  Hepatitis C blood test.  Hepatitis B blood test.  Sexually transmitted disease (STD) testing.  Diabetes screening. This is done by checking your blood sugar (glucose) after you have not eaten for a while (fasting). You may have this done every 1-3 years.  Abdominal aortic aneurysm (AAA) screening. You may need this if you are a current or former smoker.  Osteoporosis. You may be screened starting at age 15 if you are at high risk. Talk with your health care provider about your test results, treatment options, and if necessary, the need for more tests. Vaccines  Your health care provider  may recommend certain vaccines, such as:  Influenza vaccine. This is recommended every year.  Tetanus, diphtheria, and acellular pertussis (Tdap, Td) vaccine. You may need a Td booster every 10 years.  Zoster vaccine. You may need this after age 73.  Pneumococcal 13-valent conjugate (PCV13) vaccine. One dose is recommended after age 73.  Pneumococcal polysaccharide (PPSV23) vaccine. One dose is recommended after age 73. Talk to your health care provider about which screenings and vaccines you need and how often you need them. This information is not intended to replace advice given to you by your health care provider. Make sure you discuss any questions you have with your health care provider. Document Released: 04/16/2015 Document Revised: 12/08/2015 Document Reviewed: 01/19/2015 Elsevier Interactive Patient Education  2017 [place] Prevention in the Home Falls can cause injuries. They can happen to people of all ages. There are many things you can do to make your home safe and to help prevent falls. What can I do on the outside of my home?  Regularly fix the edges of walkways and driveways and fix any cracks.  Remove anything that might make you trip as you walk through a door, such as a raised step or threshold.  Trim any bushes or trees on the path to your home.  Use bright outdoor lighting.  Clear any walking paths of anything that might make someone trip, such as rocks or tools.  Regularly check to see if handrails are loose or broken. Make sure that both sides of any steps have handrails.  Any raised decks and porches should have guardrails on the edges.  Have any leaves, snow, or ice cleared regularly.  Use sand or salt on walking paths during winter.  Clean up any spills in your garage right away. This includes oil or grease spills. What can I do in the bathroom?  Use night lights.  Install grab bars by the toilet and in the tub and shower. Do not use  towel bars as grab bars.  Use non-skid mats or decals in the tub or shower.  If you need to sit down in the shower, use a plastic, non-slip stool.  Keep the floor dry. Clean up any water that spills on the floor as soon as it happens.  Remove soap buildup in the tub or shower regularly.  Attach bath mats securely with double-sided non-slip rug tape.  Do not have throw rugs and other things on the floor that can make you trip. What can I do in the bedroom?  Use night lights.  Make sure that you have a light by your bed that is easy to reach.  Do not use any sheets or blankets that are too big for your bed. They should not hang down onto the floor.  Have a firm chair that has side arms. You can use this for support while you get dressed.  Do not have throw rugs and other things on the floor that can make you trip. What can I do in the kitchen?  Clean up any spills right away.  Avoid walking on wet floors.  Keep items that you use a lot in easy-to-reach places.  If you need to reach something above you, use a strong step  stool that has a grab bar.  Keep electrical cords out of the way.  Do not use floor polish or wax that makes floors slippery. If you must use wax, use non-skid floor wax.  Do not have throw rugs and other things on the floor that can make you trip. What can I do with my stairs?  Do not leave any items on the stairs.  Make sure that there are handrails on both sides of the stairs and use them. Fix handrails that are broken or loose. Make sure that handrails are as long as the stairways.  Check any carpeting to make sure that it is firmly attached to the stairs. Fix any carpet that is loose or worn.  Avoid having throw rugs at the top or bottom of the stairs. If you do have throw rugs, attach them to the floor with carpet tape.  Make sure that you have a light switch at the top of the stairs and the bottom of the stairs. If you do not have them, ask  someone to add them for you. What else can I do to help prevent falls?  Wear shoes that:  Do not have high heels.  Have rubber bottoms.  Are comfortable and fit you well.  Are closed at the toe. Do not wear sandals.  If you use a stepladder:  Make sure that it is fully opened. Do not climb a closed stepladder.  Make sure that both sides of the stepladder are locked into place.  Ask someone to hold it for you, if possible.  Clearly mark and make sure that you can see:  Any grab bars or handrails.  First and last steps.  Where the edge of each step is.  Use tools that help you move around (mobility aids) if they are needed. These include:  Canes.  Walkers.  Scooters.  Crutches.  Turn on the lights when you go into a dark area. Replace any light bulbs as soon as they burn out.  Set up your furniture so you have a clear path. Avoid moving your furniture around.  If any of your floors are uneven, fix them.  If there are any pets around you, be aware of where they are.  Review your medicines with your doctor. Some medicines can make you feel dizzy. This can increase your chance of falling. Ask your doctor what other things that you can do to help prevent falls. This information is not intended to replace advice given to you by your health care provider. Make sure you discuss any questions you have with your health care provider. Document Released: 01/14/2009 Document Revised: 08/26/2015 Document Reviewed: 04/24/2014 Elsevier Interactive Patient Education  2017 [employer].

## 2019-06-18 NOTE — Progress Notes (Signed)
Subjective:   Matthew Humphrey is a 74 y.o. male who presents for Medicare Annual/Subsequent preventive examination.  Review of Systems:   Cardiac Risk Factors include: advanced age (>47men, >11 women);dyslipidemia;male gender;hypertension;obesity (BMI >30kg/m2)     Objective:    Vitals: BP 112/66 (BP Location: Left Arm, Patient Position: Sitting, Cuff Size: Normal)   Pulse 66   Temp (!) 97.1 F (36.2 C) (Temporal)   Resp 16   Ht 5\' 6"  (1.676 m)   Wt 197 lb 12.8 oz (89.7 kg)   SpO2 98%   BMI 31.93 kg/m   Body mass index is 31.93 kg/m.  Advanced Directives 06/18/2019 06/12/2018 07/11/2017 06/11/2017 05/18/2017 04/23/2017 12/27/2016  Does Patient Have a Medical Advance Directive? No No No No No No No  Would patient like information on creating a medical advance directive? Yes (MAU/Ambulatory/Procedural Areas - Information given) No - Patient declined No - Patient declined Yes (MAU/Ambulatory/Procedural Areas - Information given) (No Data) No - Patient declined -    Tobacco Social History   Tobacco Use  Smoking Status Former Smoker  . Packs/day: 1.50  . Years: 30.00  . Pack years: 45.00  . Types: Cigarettes  . Quit date: 17  . Years since quitting: 24.2  Smokeless Tobacco Former Systems developer  . Types: Chew  Tobacco Comment   smoking cessation materials not required     Counseling given: Not Answered Comment: smoking cessation materials not required   Clinical Intake:  Pre-visit preparation completed: Yes  Pain : No/denies pain     BMI - recorded: 31.93 Nutritional Status: BMI > 30  Obese Nutritional Risks: None Diabetes: No  How often do you need to have someone help you when you read instructions, pamphlets, or other written materials from your doctor or pharmacy?: 1 - Never  Interpreter Needed?: No  Information entered by :: Clemetine Marker LPN  Past Medical History:  Diagnosis Date  . Arthritis    knee  . Chronic kidney disease   . GERD (gastroesophageal  reflux disease)   . Hyperlipidemia   . Hypertension    Past Surgical History:  Procedure Laterality Date  . CARDIAC SURGERY  03/2010   bypass  . CERVICAL FUSION  12/11/2012   C4-5,C5-6,C6-7  . COLONOSCOPY  2011 ?  . COLONOSCOPY WITH PROPOFOL N/A 07/11/2017   Procedure: COLONOSCOPY WITH PROPOFOL;  Surgeon: Lin Landsman, MD;  Location: [facility];  Service: Endoscopy;  Laterality: N/A;  . POLYPECTOMY N/A 07/11/2017   Procedure: POLYPECTOMY;  Surgeon: Lin Landsman, MD;  Location: [facility];  Service: Endoscopy;  Laterality: N/A;   Family History  Problem Relation Age of Onset  . Hypertension Father   . Hypotension Mother    Social History   Socioeconomic History  . Marital status: Single    Spouse name: Not on file  . Number of children: 3  . Years of education: Not on file  . Highest education level: 9th grade  Occupational History  . Occupation: Retired  Tobacco Use  . Smoking status: Former Smoker    Packs/day: 1.50    Years: 30.00    Pack years: 45.00    Types: Cigarettes    Quit date: 1997    Years since quitting: 24.2  . Smokeless tobacco: Former Systems developer    Types: Chew  . Tobacco comment: smoking cessation materials not required  Substance and Sexual Activity  . Alcohol use: No    Alcohol/week: 0.0 standard drinks  . Drug use: No  .  Sexual activity: Not Currently  Other Topics Concern  . 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 Difficulty of Paying Living Expenses: Somewhat hard  Food Insecurity: No Food Insecurity  . Worried About Charity fundraiser in the Last Year: Never true  . Ran Out of Food in the Last Year: Never true  Transportation Needs: No Transportation Needs  . Lack of Transportation (Medical): No  . Lack of Transportation (Non-Medical): No  Physical Activity: Inactive  . Days  of Exercise per Week: 0 days  . Minutes of Exercise per Session: 0 min  Stress: No Stress Concern Present  . Feeling of Stress : Not at all  Social Connections: Moderately Isolated  . Frequency of Communication with Friends and Family: More than three times a week  . Frequency of Social Gatherings with Friends and Family: Twice a week  . Attends Religious Services: Never  . Active Member of Clubs or Organizations: No  . Attends Archivist Meetings: Never  . Marital Status: Separated    Outpatient Encounter Medications as of 06/18/2019  Medication Sig  . aspirin EC 81 MG tablet Take 1 tablet (81 mg total) by mouth daily.  Marland Kitchen atorvastatin (LIPITOR) 80 MG tablet Take 1 tablet (80 mg total) by mouth daily.  Marland Kitchen gabapentin (NEURONTIN) 100 MG capsule Take 1 capsule (100 mg total) by mouth 2 (two) times daily.  . metoprolol tartrate (LOPRESSOR) 25 MG tablet TAKE ONE-HALF TABLET BY  MOUTH TWICE A DAY  . pantoprazole (PROTONIX) 40 MG tablet Take 1 tablet (40 mg total) by mouth daily.  . [DISCONTINUED] etodolac (LODINE) 200 MG capsule Take 1 capsule (200 mg total) by mouth 2 (two) times daily.   No facility-administered encounter medications on file as of 06/18/2019.    Activities of Daily Living In your present state of health, do you have any difficulty performing the following activities: 06/18/2019  Hearing? N  Comment declines hearing aids  Vision? N  Difficulty concentrating or making decisions? N  Walking or climbing stairs? N  Dressing or bathing? N  Doing errands, shopping? N  Preparing Food and eating ? N  Using the Toilet? N  In the past six months, have you accidently leaked urine? N  Do you have problems with loss of bowel control? N  Managing your Medications? N  Managing your Finances? N  Housekeeping or managing your Housekeeping? N  Some recent data might be hidden    Patient Care Team: Juline Patch, MD as PCP - General (Family Medicine)   Assessment:    This is a routine wellness examination for [name].  Exercise Activities and Dietary recommendations Current Exercise Habits: The patient does not participate in regular exercise at present, Exercise limited by: None identified  Goals    . DIET - INCREASE WATER INTAKE     Recommend to drink at least 6-8 8oz glasses of water per day.       Fall Risk Fall Risk  06/18/2019 06/12/2018 01/29/2018 09/04/2017 06/11/2017  Falls in the past year? 0 0 No No No  Number falls in past yr: 0 0 - - -  Injury with Fall? 0 0 - - -  Risk for fall due to : - - - - Impaired vision  Risk for fall due to: Comment - - - - wears eyeglasses  Follow up Falls prevention discussed Falls prevention discussed - - -   FALL RISK PREVENTION PERTAINING TO THE HOME:  Any stairs in or around the home? Yes  If so, do they handrails? Yes   Home free of loose throw rugs in walkways, pet beds, electrical cords, etc? Yes  Adequate lighting in your home to reduce risk of falls? Yes   ASSISTIVE DEVICES UTILIZED TO PREVENT FALLS:  Life alert? No  Use of a cane, walker or w/c? No  Grab bars in the bathroom? No  Shower chair or bench in shower? Yes  Elevated toilet seat or a handicapped toilet? Yes   DME ORDERS:  DME order needed?  No   TIMED UP AND GO:  Was the test performed? Yes .  Length of time to ambulate 10 feet: 5 sec.   GAIT:  Appearance of gait: Gait stead-fast and without the use of an assistive device.   Education: Fall risk prevention has been discussed.  Intervention(s) required? No    Depression Screen PHQ 2/9 Scores 06/18/2019 03/13/2019 09/11/2018 06/12/2018  PHQ - 2 Score 0 0 0 0  PHQ- 9 Score - 0 0 -    Cognitive Function     6CIT Screen 06/18/2019 06/12/2018 06/11/2017  What Year? 0 points 0 points 0 points  What month? 0 points 0 points 0 points  What time? 0 points 0 points 0 points  Count back from 20 0 points 0 points 0 points  Months in reverse 0 points 2 points 0 points  Repeat  phrase 2 points 2 points 2 points  Total Score 2 4 2     Immunization History  Administered Date(s) Administered  . Fluad Quad(high Dose 65+) 03/05/2019  . Influenza, High Dose Seasonal PF 12/12/2016, 01/29/2018  . Influenza,inj,Quad PF,6+ Mos 12/16/2014, 12/15/2015  . Influenza-Unspecified 12/22/2011, 01/15/2013  . Pneumococcal Conjugate-13 08/22/2016  . Pneumococcal Polysaccharide-23 12/22/2011, 12/16/2014  . Tdap 06/22/2017  . Zoster Recombinat (Shingrix) 03/06/2018, 05/13/2018    Qualifies for Shingles Vaccine? Yes  Shingrix series complete  Tdap: Up to date  Flu Vaccine: Up to date  Pneumococcal Vaccine: Up to date   Screening Tests Health Maintenance  Topic Date Due  . TETANUS/TDAP  06/23/2027  . COLONOSCOPY  07/12/2027  . INFLUENZA VACCINE  Completed  . Hepatitis C Screening  Completed  . PNA vac Low Risk Adult  Completed   Cancer Screenings:  Colorectal Screening: Completed 07/11/17. Repeat every 10 years;   Lung Cancer Screening: (Low Dose CT Chest recommended if Age 36-80 years, 30 pack-year currently smoking OR have quit w/in 15years.) does not qualify.   Additional Screening:  Hepatitis C Screening: does qualify; Completed 03/13/19  Vision Screening: Recommended annual ophthalmology exams for early detection of glaucoma and other disorders of the eye. Is the patient up to date with their annual eye exam?  Yes  Who is the provider or what is the name of the office in which the pt attends annual eye exams? Dr. Wyatt Portela  Dental Screening: Recommended annual dental exams for proper oral hygiene  Community Resource Referral:  CRR required this visit?  Yes - utility payment assistance       Plan:    I have personally reviewed and addressed the Medicare Annual Wellness questionnaire and have noted the following in the patient's chart:  A. Medical and social history B. Use of alcohol, tobacco or illicit drugs  C. Current medications and  supplements D. Functional ability and status E.  Nutritional status  F.  Physical activity G. Advance directives H. List of other physicians I.  Hospitalizations, surgeries, and ER visits in previous 12 months J.  [place] such as hearing and vision if needed, cognitive and depression L. Referrals and appointments   In addition, I have reviewed and discussed with patient certain preventive protocols, quality metrics, and best practice recommendations. A written personalized care plan for preventive services as well as general preventive health recommendations were provided to patient.   Signed,  Clemetine Marker, LPN Nurse Health Advisor   Nurse Notes: phone numbers provided to schedule appt for Covid vaccine with [facility] and [facility]

## 2019-06-19 ENCOUNTER — Telehealth: Payer: Self-pay

## 2019-06-19 NOTE — Telephone Encounter (Signed)
06/19/2019 Spoke with patient, he was not at home and asked if he could call me back with informatio to complete his LIEAP application. Gave patient my name, number and times I am at work. Ambrose Mantle [phone]

## 2019-06-24 ENCOUNTER — Telehealth: Payer: Self-pay

## 2019-06-24 NOTE — Telephone Encounter (Signed)
06/24/2019 Left message on voicemail for patient to return my call regarding completion of LIEAP application. Matthew Humphrey [phone]

## 2019-07-01 ENCOUNTER — Telehealth: Payer: Self-pay

## 2019-07-01 NOTE — Telephone Encounter (Signed)
07/01/2019 Spoke with patient, he was not at home and did not have the information to complete the LIEAP application.  He asked if the application could be mailed to him.  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  Ambrose Mantle [phone]

## 2019-07-09 ENCOUNTER — Telehealth: Payer: Self-pay

## 2019-07-09 NOTE — Telephone Encounter (Signed)
07/09/19  Left message for patient to return my call about receiving LIEAP application in the mail. Ambrose Mantle [phone]

## 2019-07-09 NOTE — Telephone Encounter (Signed)
Application was mailed today from [facility]. Today was my first day in office due to [facility] on Monday. Pt should receive in the mail this week.

## 2019-07-15 ENCOUNTER — Telehealth: Payer: Self-pay

## 2019-07-15 NOTE — Telephone Encounter (Signed)
07/15/19 Left message for patient to return my call about receiving LIEAP application in the mail. Ambrose Mantle [phone]

## 2019-07-18 ENCOUNTER — Telehealth: Payer: Self-pay

## 2019-07-18 NOTE — Telephone Encounter (Signed)
07/18/19 Left message for patient to return my call about receiving LIEAP application in the mail. Ambrose Mantle [phone]

## 2019-07-21 ENCOUNTER — Telehealth: Payer: Self-pay

## 2019-07-21 NOTE — Telephone Encounter (Signed)
07/21/19 3rd unseccessful attempt to follow-up with patient. Left message for patient to return my call about receiving LIEAP application in the mail.  Closing referral. Ambrose Mantle [phone]

## 2019-07-23 DIAGNOSIS — H25013 Cortical age-related cataract, bilateral: Secondary | ICD-10-CM | POA: Diagnosis not present

## 2019-09-11 ENCOUNTER — Encounter: Payer: Self-pay | Admitting: Family Medicine

## 2019-09-11 ENCOUNTER — Ambulatory Visit (INDEPENDENT_AMBULATORY_CARE_PROVIDER_SITE_OTHER): Payer: Medicare Other | Admitting: Family Medicine

## 2019-09-11 ENCOUNTER — Other Ambulatory Visit: Payer: Self-pay

## 2019-09-11 DIAGNOSIS — K219 Gastro-esophageal reflux disease without esophagitis: Secondary | ICD-10-CM

## 2019-09-11 DIAGNOSIS — I10 Essential (primary) hypertension: Secondary | ICD-10-CM | POA: Diagnosis not present

## 2019-09-11 DIAGNOSIS — I25810 Atherosclerosis of coronary artery bypass graft(s) without angina pectoris: Secondary | ICD-10-CM | POA: Diagnosis not present

## 2019-09-11 DIAGNOSIS — E785 Hyperlipidemia, unspecified: Secondary | ICD-10-CM

## 2019-09-11 MED ORDER — PANTOPRAZOLE SODIUM 40 MG PO TBEC: 40.0000 mg | DELAYED_RELEASE_TABLET | Freq: Every day | ORAL | 1 refills | Status: DC

## 2019-09-11 MED ORDER — ATORVASTATIN CALCIUM 80 MG PO TABS: 80.0000 mg | ORAL_TABLET | Freq: Every day | ORAL | 1 refills | Status: DC

## 2019-09-11 MED ORDER — ASPIRIN EC 81 MG PO TBEC: 81.0000 mg | DELAYED_RELEASE_TABLET | Freq: Every day | ORAL | 3 refills | Status: AC

## 2019-09-11 MED ORDER — METOPROLOL TARTRATE 25 MG PO TABS: ORAL_TABLET | ORAL | 1 refills | Status: DC

## 2019-09-11 NOTE — Progress Notes (Signed)
Date:  09/11/2019   Name:  Matthew Humphrey   DOB:  December 05, 1945   MRN:  [id number]   Chief Complaint: Hypertension, Hyperlipidemia, and Gastroesophageal Reflux  Hypertension This is a chronic problem. The current episode started more than 1 year ago. The problem has been waxing and waning since onset. The problem is controlled. Pertinent negatives include no anxiety, blurred vision, chest pain, headaches, malaise/fatigue, neck pain, orthopnea, palpitations, peripheral edema, PND, shortness of breath or sweats. There are no associated agents to hypertension. Risk factors for coronary artery disease include male gender, obesity and dyslipidemia. Past treatments include beta blockers. The current treatment provides moderate improvement. There are no compliance problems.  There is no history of angina, kidney disease, CAD/MI, CVA, heart failure, left ventricular hypertrophy, PVD or retinopathy. There is no history of chronic renal disease, a hypertension causing med or renovascular disease.  Hyperlipidemia This is a chronic problem. The current episode started more than 1 year ago. The problem is controlled. Recent lipid tests were reviewed and are normal. Exacerbating diseases include obesity. He has no history of chronic renal disease, diabetes, hypothyroidism, liver disease or nephrotic syndrome. Factors aggravating his hyperlipidemia include beta blockers. Pertinent negatives include no chest pain, focal sensory loss, focal weakness, leg pain, myalgias or shortness of breath. Current antihyperlipidemic treatment includes statins. The current treatment provides moderate improvement of lipids. There are no compliance problems.  Risk factors for coronary artery disease include hypertension, dyslipidemia, male sex and obesity.  Gastroesophageal Reflux He reports no abdominal pain, no belching, no chest pain, no choking, no coughing, no dysphagia, no early satiety, no globus sensation, no heartburn, no  hoarse voice, no nausea, no sore throat, no stridor, no tooth decay, no water brash or no wheezing. This is a chronic problem. The current episode started more than 1 year ago. The problem occurs occasionally. The problem has been unchanged (stable). Nothing aggravates the symptoms. Pertinent negatives include no anemia, fatigue, melena, muscle weakness, orthopnea or weight loss. He has tried a PPI for the symptoms.    Lab Results  Component Value Date   CREATININE 1.59 (H) 03/13/2019   BUN 26 03/13/2019   NA 144 03/13/2019   K 4.9 03/13/2019   CL 106 03/13/2019   CO2 23 03/13/2019   Lab Results  Component Value Date   CHOL 134 03/13/2019   HDL 42 03/13/2019   LDLCALC 76 03/13/2019   TRIG 83 03/13/2019   CHOLHDL 3.9 03/12/2018   No results found for: TSH No results found for: HGBA1C Lab Results  Component Value Date   WBC 5.0 01/03/2013   HGB 14.4 01/03/2013   HCT 44.1 01/03/2013   MCV 90 01/03/2013   PLT 282 01/03/2013   Lab Results  Component Value Date   ALT 35 09/11/2018   AST 49 (H) 09/11/2018   ALKPHOS 89 09/11/2018   BILITOT 0.4 09/11/2018     Review of Systems  Constitutional: Negative for chills, fatigue, fever, malaise/fatigue and weight loss.  HENT: Negative for drooling, ear discharge, ear pain, hoarse voice and sore throat.   Eyes: Negative for blurred vision.  Respiratory: Negative for cough, choking, shortness of breath and wheezing.   Cardiovascular: Negative for chest pain, palpitations, orthopnea, leg swelling and PND.  Gastrointestinal: Negative for abdominal pain, blood in stool, constipation, diarrhea, dysphagia, heartburn, melena and nausea.  Endocrine: Negative for polydipsia.  Genitourinary: Negative for dysuria, frequency, hematuria and urgency.  Musculoskeletal: Negative for back pain, myalgias, muscle weakness and  neck pain.  Skin: Negative for rash.  Allergic/Immunologic: Negative for environmental allergies.  Neurological: Negative for  dizziness, focal weakness and headaches.  Hematological: Does not bruise/bleed easily.  Psychiatric/Behavioral: Negative for suicidal ideas. The patient is not nervous/anxious.     Patient Active Problem List   Diagnosis Date Noted  . Chronic kidney disease 06/12/2018  . Mediastinitis 06/12/2018  . MRSA infection 06/12/2018  . Peptic ulcer 06/12/2018  . Vertebral osteomyelitis ([place]) 06/12/2018  . Coronary artery disease involving autologous vein coronary bypass graft without angina pectoris 09/04/2017  . Cervical disc disease 09/04/2017  . Special screening for malignant neoplasms, colon   . Obesity (BMI 30.0-34.9) 12/20/2016  . Primary osteoarthritis of right knee 12/20/2016  . Primary osteoarthritis of both knees 08/22/2016  . Radicular leg pain 12/15/2015  . Essential hypertension 12/16/2014  . Hyperlipidemia 12/16/2014  . Esophageal reflux 12/16/2014  . Spinal stenosis in cervical region 11/27/2012  . Hx of colonoscopy with polypectomy 12/21/2011  . Impaired fasting glucose 08/29/2011    Allergies  Allergen Reactions  . Sulfa Antibiotics Other (See Comments)  . Sulfamethoxazole-Trimethoprim Other (See Comments)    Other reaction(s): Unknown Other reaction(s): Unknown   . Sulfasalazine Other (See Comments)    Past Surgical History:  Procedure Laterality Date  . CARDIAC SURGERY  03/2010   bypass  . CERVICAL FUSION  12/11/2012   C4-5,C5-6,C6-7  . COLONOSCOPY  2011 ?  . COLONOSCOPY WITH PROPOFOL N/A 07/11/2017   Procedure: COLONOSCOPY WITH PROPOFOL;  Surgeon: Lin Landsman, MD;  Location: [facility];  Service: Endoscopy;  Laterality: N/A;  . POLYPECTOMY N/A 07/11/2017   Procedure: POLYPECTOMY;  Surgeon: Lin Landsman, MD;  Location: [facility];  Service: Endoscopy;  Laterality: N/A;    Social History   Tobacco Use  . Smoking status: Former Smoker    Packs/day: 1.50    Years: 30.00    Pack years: 45.00    Types: Cigarettes    Quit  date: 1997    Years since quitting: 24.4  . Smokeless tobacco: Former Systems developer    Types: Chew  . Tobacco comment: smoking cessation materials not required  Vaping Use  . Vaping Use: Never used  Substance Use Topics  . Alcohol use: No    Alcohol/week: 0.0 standard drinks  . Drug use: No     Medication list has been reviewed and updated.  Current Meds  Medication Sig  . aspirin EC 81 MG tablet Take 1 tablet (81 mg total) by mouth daily.  Marland Kitchen atorvastatin (LIPITOR) 80 MG tablet Take 1 tablet (80 mg total) by mouth daily.  . metoprolol tartrate (LOPRESSOR) 25 MG tablet TAKE ONE-HALF TABLET BY  MOUTH TWICE A DAY  . pantoprazole (PROTONIX) 40 MG tablet Take 1 tablet (40 mg total) by mouth daily.  . [DISCONTINUED] gabapentin (NEURONTIN) 100 MG capsule Take 1 capsule (100 mg total) by mouth 2 (two) times daily.    PHQ 2/9 Scores 09/11/2019 06/18/2019 03/13/2019 09/11/2018  PHQ - 2 Score 0 0 0 0  PHQ- 9 Score 0 - 0 0    BP Readings from Last 3 Encounters:  09/11/19 (!) 120/54  06/18/19 112/66  03/13/19 (!) 118/50    Physical Exam Vitals and nursing note reviewed.  HENT:     Head: Normocephalic.     Right Ear: External ear normal.     Left Ear: External ear normal.     Nose: Nose normal.  Eyes:     General: No scleral  icterus.       Right eye: No discharge.        Left eye: No discharge.     Conjunctiva/sclera: Conjunctivae normal.     Pupils: Pupils are equal, round, and reactive to light.  Neck:     Thyroid: No thyromegaly.     Vascular: No JVD.     Trachea: No tracheal deviation.  Cardiovascular:     Rate and Rhythm: Normal rate and regular rhythm.     Heart sounds: Normal heart sounds. No murmur heard.  No friction rub. No gallop.   Pulmonary:     Effort: No respiratory distress.     Breath sounds: Normal breath sounds. No wheezing or rales.  Abdominal:     General: Bowel sounds are normal.     Palpations: Abdomen is soft. There is no mass.     Tenderness: There is no  abdominal tenderness. There is no guarding or rebound.  Musculoskeletal:        General: No tenderness. Normal range of motion.     Cervical back: Normal range of motion and neck supple.  Lymphadenopathy:     Cervical: No cervical adenopathy.  Skin:    General: Skin is warm.     Findings: No rash.  Neurological:     Mental Status: He is alert and oriented to person, place, and time.     Cranial Nerves: No cranial nerve deficit.     Deep Tendon Reflexes: Reflexes are normal and symmetric.     Wt Readings from Last 3 Encounters:  09/11/19 193 lb (87.5 kg)  06/18/19 197 lb 12.8 oz (89.7 kg)  03/13/19 199 lb (90.3 kg)    BP (!) 120/54   Pulse 60   Ht 5\' 6"  (1.676 m)   Wt 193 lb (87.5 kg)   BMI 31.15 kg/m   Assessment and Plan: 1. Coronary artery disease involving autologous vein coronary bypass graft without angina pectoris Chronic.  Controlled.  Stable.  Patient relates no chest discomfort or risk symptoms with activity.  We will continue to control blood pressure and lipid.  Patient will also continue over-the-counter low-dose enteric-coated aspirin. - atorvastatin (LIPITOR) 80 MG tablet; Take 1 tablet (80 mg total) by mouth daily.  Dispense: 90 tablet; Refill: 1 - metoprolol tartrate (LOPRESSOR) 25 MG tablet; TAKE ONE-HALF TABLET BY  MOUTH TWICE A DAY  Dispense: 90 tablet; Refill: 1 - aspirin EC 81 MG tablet; Take 1 tablet (81 mg total) by mouth daily.  Dispense: 90 tablet; Refill: 3  2. Mild hyperlipidemia Chronic.  Controlled.  Stable.  Continue atorvastatin 80 mg once a day.  Will recheck lipid in 6 months. - atorvastatin (LIPITOR) 80 MG tablet; Take 1 tablet (80 mg total) by mouth daily.  Dispense: 90 tablet; Refill: 1  3. Essential hypertension .  Controlled.  Stable.  Continue metoprolol 25 mg 1/2 tablet twice a day for blood pressure control and ischemic heart disease control.  Will check renal function panel. - metoprolol tartrate (LOPRESSOR) 25 MG tablet; TAKE  ONE-HALF TABLET BY  MOUTH TWICE A DAY  Dispense: 90 tablet; Refill: 1 - Renal Function Panel  4. Gastroesophageal reflux disease, unspecified whether esophagitis present Chronic.  Controlled.  Stable.  Continue pantoprazole 40 mg once a day. - pantoprazole (PROTONIX) 40 MG tablet; Take 1 tablet (40 mg total) by mouth daily.  Dispense: 90 tablet; Refill: 1

## 2019-09-12 LAB — RENAL FUNCTION PANEL
Albumin: 4.4 g/dL (ref 3.7–4.7)
BUN/Creatinine Ratio: 13 (ref 10–24)
BUN: 19 mg/dL (ref 8–27)
CO2: 25 mmol/L (ref 20–29)
Calcium: 9.6 mg/dL (ref 8.6–10.2)
Chloride: 107 mmol/L — ABNORMAL HIGH (ref 96–106)
Creatinine, Ser: 1.51 mg/dL — ABNORMAL HIGH (ref 0.76–1.27)
GFR calc Af Amer: 52 mL/min/{1.73_m2} — ABNORMAL LOW (ref >59)
GFR calc non Af Amer: 45 mL/min/{1.73_m2} — ABNORMAL LOW (ref >59)
Glucose: 100 mg/dL — ABNORMAL HIGH (ref 65–99)
Phosphorus: 3.1 mg/dL (ref 2.8–4.1)
Potassium: 5.1 mmol/L (ref 3.5–5.2)
Sodium: 143 mmol/L (ref 134–144)

## 2019-12-15 ENCOUNTER — Telehealth: Payer: Self-pay

## 2019-12-15 NOTE — Chronic Care Management (AMB) (Signed)
  Chronic Care Management   Note  12/15/2019 Name: Matthew Humphrey MRN: [id number] DOB: December 11, 1945  Matthew Humphrey is a 75 y.o. year old male who is a primary care patient of Juline Patch, MD. I reached out to [employer] by phone today in response to a referral sent by Matthew Humphrey [facility] health plan.     Mr. Staples was given information about Chronic Care Management services today including:  1. CCM service includes personalized support from designated clinical staff supervised by his physician, including individualized plan of care and coordination with other care providers 2. 24/7 contact phone numbers for assistance for urgent and routine care needs. 3. Service will only be billed when office clinical staff spend 20 minutes or more in a month to coordinate care. 4. Only one practitioner may furnish and bill the service in a calendar month. 5. The patient may stop CCM services at any time (effective at the end of the month) by phone call to the office staff. 6. The patient will be responsible for cost sharing (co-pay) of up to 20% of the service fee (after annual deductible is met).  Patient did not agree to enrollment in care management services and does not wish to consider at this time.  Follow up plan: The patient has been provided with contact information for the care management team and has been advised to call with any health related questions or concerns.   Noreene Larsson, [place], [place], [place] [address] Direct Dial: [phone] [email or web address] Website: [email or web address]

## 2020-02-16 ENCOUNTER — Other Ambulatory Visit: Payer: Self-pay

## 2020-02-16 ENCOUNTER — Ambulatory Visit (INDEPENDENT_AMBULATORY_CARE_PROVIDER_SITE_OTHER): Payer: Medicare HMO | Admitting: Family Medicine

## 2020-02-16 ENCOUNTER — Encounter: Payer: Self-pay | Admitting: Family Medicine

## 2020-02-16 VITALS — BP 130/80 | HR 76 | Ht 66.0 in | Wt 189.0 lb

## 2020-02-16 DIAGNOSIS — I10 Essential (primary) hypertension: Secondary | ICD-10-CM | POA: Diagnosis not present

## 2020-02-16 DIAGNOSIS — I25810 Atherosclerosis of coronary artery bypass graft(s) without angina pectoris: Secondary | ICD-10-CM

## 2020-02-16 DIAGNOSIS — E785 Hyperlipidemia, unspecified: Secondary | ICD-10-CM

## 2020-02-16 DIAGNOSIS — K219 Gastro-esophageal reflux disease without esophagitis: Secondary | ICD-10-CM | POA: Diagnosis not present

## 2020-02-16 DIAGNOSIS — Z23 Encounter for immunization: Secondary | ICD-10-CM | POA: Diagnosis not present

## 2020-02-16 MED ORDER — ATORVASTATIN CALCIUM 80 MG PO TABS: 80.0000 mg | ORAL_TABLET | Freq: Every day | ORAL | 1 refills | Status: DC

## 2020-02-16 MED ORDER — PANTOPRAZOLE SODIUM 40 MG PO TBEC: 40.0000 mg | DELAYED_RELEASE_TABLET | Freq: Every day | ORAL | 1 refills | Status: DC

## 2020-02-16 MED ORDER — METOPROLOL TARTRATE 25 MG PO TABS: ORAL_TABLET | ORAL | 1 refills | Status: DC

## 2020-02-16 NOTE — Progress Notes (Signed)
Date:  02/16/2020   Name:  Matthew Humphrey   DOB:  [birth year]   MRN:  [id number]   Chief Complaint: Gastroesophageal Reflux, Hyperlipidemia, Hypertension, and Flu Vaccine  Gastroesophageal Reflux He reports no abdominal pain, no belching, no chest pain, no choking, no coughing, no dysphagia, no early satiety, no globus sensation, no heartburn, no hoarse voice, no nausea, no sore throat, no stridor, no water brash or no wheezing. This is a chronic problem. The current episode started more than 1 year ago. The problem occurs rarely. The problem has been gradually improving. The symptoms are aggravated by certain foods. Pertinent negatives include no anemia, fatigue, melena, muscle weakness, orthopnea or weight loss. Risk factors include obesity. He has tried a PPI for the symptoms. The treatment provided moderate relief.  Hyperlipidemia This is a chronic problem. The current episode started more than 1 year ago. Recent lipid tests were reviewed and are normal. He has no history of chronic renal disease, diabetes, hypothyroidism, liver disease, obesity or nephrotic syndrome. Pertinent negatives include no chest pain, focal sensory loss, focal weakness, leg pain, myalgias or shortness of breath. Current antihyperlipidemic treatment includes statins. The current treatment provides moderate improvement of lipids. Risk factors for coronary artery disease include dyslipidemia and hypertension.  Hypertension This is a chronic problem. The current episode started more than 1 year ago. The problem has been gradually improving since onset. Pertinent negatives include no chest pain, headaches, neck pain, palpitations or shortness of breath. There are no associated agents to hypertension. Past treatments include beta blockers. The current treatment provides moderate improvement. Hypertensive end-organ damage includes CAD/MI. There is no history of angina, kidney disease, CVA, heart failure, left ventricular  hypertrophy, PVD or retinopathy. There is no history of chronic renal disease, a hypertension causing med or renovascular disease.  Heart Problem This is a chronic (hx bypass surgery) problem. The problem occurs rarely. The problem has been gradually improving. Pertinent negatives include no abdominal pain, chest pain, chills, congestion, coughing, fatigue, fever, headaches, myalgias, nausea, neck pain, rash or sore throat. Treatments tried: metoprolol. The treatment provided moderate relief.    Lab Results  Component Value Date   CREATININE 1.51 (H) 09/11/2019   BUN 19 09/11/2019   NA 143 09/11/2019   K 5.1 09/11/2019   CL 107 (H) 09/11/2019   CO2 25 09/11/2019   Lab Results  Component Value Date   CHOL 134 03/13/2019   HDL 42 03/13/2019   LDLCALC 76 03/13/2019   TRIG 83 03/13/2019   CHOLHDL 3.9 03/12/2018   No results found for: TSH No results found for: HGBA1C Lab Results  Component Value Date   WBC 5.0 01/03/2013   HGB 14.4 01/03/2013   HCT 44.1 01/03/2013   MCV 90 01/03/2013   PLT 282 01/03/2013   Lab Results  Component Value Date   ALT 35 09/11/2018   AST 49 (H) 09/11/2018   ALKPHOS 89 09/11/2018   BILITOT 0.4 09/11/2018     Review of Systems  Constitutional: Negative for chills, fatigue, fever and weight loss.  HENT: Negative for congestion, drooling, ear discharge, ear pain, hoarse voice and sore throat.   Respiratory: Negative for cough, choking, shortness of breath and wheezing.   Cardiovascular: Negative for chest pain, palpitations and leg swelling.  Gastrointestinal: Negative for abdominal pain, blood in stool, constipation, diarrhea, dysphagia, heartburn, melena and nausea.  Endocrine: Negative for polydipsia.  Genitourinary: Negative for dysuria, frequency, hematuria and urgency.  Musculoskeletal: Negative for back pain,  myalgias, muscle weakness and neck pain.  Skin: Negative for rash.  Allergic/Immunologic: Negative for environmental allergies.    Neurological: Negative for dizziness, focal weakness and headaches.  Hematological: Does not bruise/bleed easily.  Psychiatric/Behavioral: Negative for suicidal ideas. The patient is not nervous/anxious.     Patient Active Problem List   Diagnosis Date Noted  . Chronic kidney disease 06/12/2018  . Mediastinitis 06/12/2018  . MRSA infection 06/12/2018  . Peptic ulcer 06/12/2018  . Vertebral osteomyelitis ([place]) 06/12/2018  . Coronary artery disease involving autologous vein coronary bypass graft without angina pectoris 09/04/2017  . Cervical disc disease 09/04/2017  . Special screening for malignant neoplasms, colon   . Obesity (BMI 30.0-34.9) 12/20/2016  . Primary osteoarthritis of right knee 12/20/2016  . Primary osteoarthritis of both knees 08/22/2016  . Radicular leg pain 12/15/2015  . Essential hypertension 12/16/2014  . Hyperlipidemia 12/16/2014  . Esophageal reflux 12/16/2014  . Spinal stenosis in cervical region 11/27/2012  . Hx of colonoscopy with polypectomy 12/21/2011  . Impaired fasting glucose 08/29/2011    Allergies  Allergen Reactions  . Sulfa Antibiotics Other (See Comments)  . Sulfamethoxazole-Trimethoprim Other (See Comments)    Other reaction(s): Unknown Other reaction(s): Unknown   . Sulfasalazine Other (See Comments)    Past Surgical History:  Procedure Laterality Date  . CARDIAC SURGERY  03/2010   bypass  . CERVICAL FUSION  12/11/2012   C4-5,C5-6,C6-7  . COLONOSCOPY  2011 ?  . COLONOSCOPY WITH PROPOFOL N/A 07/11/2017   Procedure: COLONOSCOPY WITH PROPOFOL;  Surgeon: Lin Landsman, MD;  Location: [facility];  Service: Endoscopy;  Laterality: N/A;  . POLYPECTOMY N/A 07/11/2017   Procedure: POLYPECTOMY;  Surgeon: Lin Landsman, MD;  Location: [facility];  Service: Endoscopy;  Laterality: N/A;    Social History   Tobacco Use  . Smoking status: Former Smoker    Packs/day: 1.50    Years: 30.00    Pack years: 45.00     Types: Cigarettes    Quit date: 1997    Years since quitting: 24.8  . Smokeless tobacco: Former Systems developer    Types: Chew  . Tobacco comment: smoking cessation materials not required  Vaping Use  . Vaping Use: Never used  Substance Use Topics  . Alcohol use: No    Alcohol/week: 0.0 standard drinks  . Drug use: No     Medication list has been reviewed and updated.  Current Meds  Medication Sig  . aspirin EC 81 MG tablet Take 1 tablet (81 mg total) by mouth daily.  Marland Kitchen atorvastatin (LIPITOR) 80 MG tablet Take 1 tablet (80 mg total) by mouth daily.  . metoprolol tartrate (LOPRESSOR) 25 MG tablet TAKE ONE-HALF TABLET BY  MOUTH TWICE A DAY  . pantoprazole (PROTONIX) 40 MG tablet Take 1 tablet (40 mg total) by mouth daily.    PHQ 2/9 Scores 02/16/2020 09/11/2019 06/18/2019 03/13/2019  PHQ - 2 Score 0 0 0 0  PHQ- 9 Score 0 0 - 0    GAD 7 : Generalized Anxiety Score 02/16/2020 09/11/2019  Nervous, Anxious, on Edge 0 0  Control/stop worrying 0 0  Worry too much - different things 0 0  Trouble relaxing 0 0  Restless 0 0  Easily annoyed or irritable 0 0  Afraid - awful might happen 0 0  Total GAD 7 Score 0 0    BP Readings from Last 3 Encounters:  02/16/20 130/80  09/11/19 (!) 120/54  06/18/19 112/66    Physical Exam  Wt Readings from Last 3 Encounters:  02/16/20 189 lb (85.7 kg)  09/11/19 193 lb (87.5 kg)  06/18/19 197 lb 12.8 oz (89.7 kg)    BP 130/80   Pulse 76   Ht 5\' 6"  (1.676 m)   Wt 189 lb (85.7 kg)   BMI 30.51 kg/m   Assessment and Plan:                                                    Patient's chart was reviewed and most recent encounter was reviewed as well as most recent labs and most recent imaging. 1. Coronary artery disease involving autologous vein coronary bypass graft without angina pectoris Chronic.  Stable.  Controlled.  Patient's had no episodes of chest pain or palpitations.  Patient underwent bypass surgery.  We will continue aspirin 81 mg as well as  control of lipids with atorvastatin 80 mg once a day and blood pressure with metoprolol 12.5 mg twice a day. - atorvastatin (LIPITOR) 80 MG tablet; Take 1 tablet (80 mg total) by mouth daily.  Dispense: 90 tablet; Refill: 1 - metoprolol tartrate (LOPRESSOR) 25 MG tablet; TAKE ONE-HALF TABLET BY  MOUTH TWICE A DAY  Dispense: 90 tablet; Refill: 1 - Lipid Panel With LDL/HDL Ratio  2. Mild hyperlipidemia Chronic.  Controlled.  Stable.  Continue atorvastatin 80 mg once a day.  Will check lipid panel.- atorvastatin (LIPITOR) 80 MG tablet; Take 1 tablet (80 mg total) by mouth daily.  Dispense: 90 tablet; Refill: 1 - Lipid Panel With LDL/HDL Ratio  3. Essential hypertension Chronic.  Controlled.  Stable.  Continue metoprolol 12.5 mg one half of a 25 mg tablet twice a day.  Will check CMP. - metoprolol tartrate (LOPRESSOR) 25 MG tablet; TAKE ONE-HALF TABLET BY  MOUTH TWICE A DAY  Dispense: 90 tablet; Refill: 1 - Comprehensive Metabolic Panel (CMET)  4. Gastroesophageal reflux disease, unspecified whether esophagitis present Chronic.  Controlled.  Stable.  Continue pantoprazole 40 mg once a day. - pantoprazole (PROTONIX) 40 MG tablet; Take 1 tablet (40 mg total) by mouth daily.  Dispense: 90 tablet; Refill: 1  5. Need for immunization against influenza Discussed and administered. - Flu Vaccine QUAD High Dose(Fluad)

## 2020-02-17 LAB — LIPID PANEL WITH LDL/HDL RATIO
Cholesterol, Total: 187 mg/dL (ref 100–199)
HDL: 49 mg/dL (ref >39)
LDL Chol Calc (NIH): 126 mg/dL — ABNORMAL HIGH (ref 0–99)
LDL/HDL Ratio: 2.6 ratio (ref 0.0–3.6)
Triglycerides: 62 mg/dL (ref 0–149)
VLDL Cholesterol Cal: 12 mg/dL (ref 5–40)

## 2020-02-17 LAB — COMPREHENSIVE METABOLIC PANEL
ALT: 39 IU/L (ref 0–44)
AST: 35 IU/L (ref 0–40)
Albumin/Globulin Ratio: 1.6 (ref 1.2–2.2)
Albumin: 4.3 g/dL (ref 3.7–4.7)
Alkaline Phosphatase: 111 IU/L (ref 44–121)
BUN/Creatinine Ratio: 16 (ref 10–24)
BUN: 25 mg/dL (ref 8–27)
Bilirubin Total: 0.6 mg/dL (ref 0.0–1.2)
CO2: 23 mmol/L (ref 20–29)
Calcium: 9.8 mg/dL (ref 8.6–10.2)
Chloride: 106 mmol/L (ref 96–106)
Creatinine, Ser: 1.58 mg/dL — ABNORMAL HIGH (ref 0.76–1.27)
GFR calc Af Amer: 49 mL/min/{1.73_m2} — ABNORMAL LOW (ref >59)
GFR calc non Af Amer: 43 mL/min/{1.73_m2} — ABNORMAL LOW (ref >59)
Globulin, Total: 2.7 g/dL (ref 1.5–4.5)
Glucose: 91 mg/dL (ref 65–99)
Potassium: 4.6 mmol/L (ref 3.5–5.2)
Sodium: 142 mmol/L (ref 134–144)
Total Protein: 7 g/dL (ref 6.0–8.5)

## 2020-06-21 ENCOUNTER — Other Ambulatory Visit: Payer: Self-pay

## 2020-06-21 ENCOUNTER — Ambulatory Visit (INDEPENDENT_AMBULATORY_CARE_PROVIDER_SITE_OTHER): Payer: Medicare HMO

## 2020-06-21 VITALS — BP 142/80 | HR 74 | Temp 98.5°F | Resp 16 | Ht 66.0 in | Wt 194.6 lb

## 2020-06-21 DIAGNOSIS — Z0000 Encounter for general adult medical examination without abnormal findings: Secondary | ICD-10-CM

## 2020-06-21 NOTE — Progress Notes (Signed)
Subjective:   Zacary Bauer is a 75 y.o. male who presents for Medicare Annual/Subsequent preventive examination.  Review of Systems     Cardiac Risk Factors include: advanced age (>59men, >4 women);dyslipidemia;male gender;hypertension;obesity (BMI >30kg/m2)     Objective:    Today's Vitals   06/21/20 0912  BP: (!) 142/80  Pulse: 74  Resp: 16  Temp: 98.5 F (36.9 C)  TempSrc: Oral  SpO2: 98%  Weight: 194 lb 9.6 oz (88.3 kg)  Height: 5\' 6"  (1.676 m)   Body mass index is 31.41 kg/m.  Advanced Directives 06/21/2020 06/18/2019 06/12/2018 07/11/2017 06/11/2017 05/18/2017 04/23/2017  Does Patient Have a Medical Advance Directive? No No No No No No No  Would patient like information on creating a medical advance directive? No - Patient declined Yes (MAU/Ambulatory/Procedural Areas - Information given) No - Patient declined No - Patient declined Yes (MAU/Ambulatory/Procedural Areas - Information given) (No Data) No - Patient declined    Current Medications (verified) Outpatient Encounter Medications as of 06/21/2020  Medication Sig  . aspirin EC 81 MG tablet Take 1 tablet (81 mg total) by mouth daily.  Marland Kitchen atorvastatin (LIPITOR) 80 MG tablet Take 1 tablet (80 mg total) by mouth daily.  . metoprolol tartrate (LOPRESSOR) 25 MG tablet TAKE ONE-HALF TABLET BY  MOUTH TWICE A DAY  . pantoprazole (PROTONIX) 40 MG tablet Take 1 tablet (40 mg total) by mouth daily.   No facility-administered encounter medications on file as of 06/21/2020.    Allergies (verified) Sulfa antibiotics, Sulfamethoxazole-trimethoprim, and Sulfasalazine   History: Past Medical History:  Diagnosis Date  . Arthritis    knee  . Chronic kidney disease   . GERD (gastroesophageal reflux disease)   . Hyperlipidemia   . Hypertension    Past Surgical History:  Procedure Laterality Date  . CARDIAC SURGERY  03/2010   bypass  . CERVICAL FUSION  12/11/2012   C4-5,C5-6,C6-7  . COLONOSCOPY  2011 ?  . COLONOSCOPY  WITH PROPOFOL N/A 07/11/2017   Procedure: COLONOSCOPY WITH PROPOFOL;  Surgeon: Lin Landsman, MD;  Location: [facility];  Service: Endoscopy;  Laterality: N/A;  . POLYPECTOMY N/A 07/11/2017   Procedure: POLYPECTOMY;  Surgeon: Lin Landsman, MD;  Location: [facility];  Service: Endoscopy;  Laterality: N/A;   Family History  Problem Relation Age of Onset  . Hypertension Father   . Hypotension Mother    Social History   Socioeconomic History  . Marital status: Single    Spouse name: Not on file  . Number of children: 3  . Years of education: Not on file  . Highest education level: 9th grade  Occupational History  . Occupation: Retired  Tobacco Use  . Smoking status: Former Smoker    Packs/day: 1.50    Years: 30.00    Pack years: 45.00    Types: Cigarettes    Quit date: 1997    Years since quitting: 25.2  . Smokeless tobacco: Former Systems developer    Types: Chew  . Tobacco comment: smoking cessation materials not required  Vaping Use  . Vaping Use: Never used  Substance and Sexual Activity  . Alcohol use: No    Alcohol/week: 0.0 standard drinks  . Drug use: No  . Sexual activity: Not Currently  Other Topics Concern  . 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  Difficulty of Paying Living Expenses: Not very hard  Food Insecurity: No Food Insecurity  . Worried About Charity fundraiser in the Last Year: Never true  . Ran Out of Food in the Last Year: Never true  Transportation Needs: No Transportation Needs  . Lack of Transportation (Medical): No  . Lack of Transportation (Non-Medical): No  Physical Activity: Inactive  . Days of Exercise per Week: 0 days  . Minutes of Exercise per Session: 0 min  Stress: No Stress Concern Present  . Feeling of Stress : Not at all  Social Connections: Socially Isolated  . Frequency of  Communication with Friends and Family: More than three times a week  . Frequency of Social Gatherings with Friends and Family: Once a week  . Attends Religious Services: Never  . Active Member of Clubs or Organizations: Not on file  . Attends Archivist Meetings: Never  . Marital Status: Separated    Tobacco Counseling Counseling given: Not Answered Comment: smoking cessation materials not required   Clinical Intake:  Pre-visit preparation completed: Yes  Pain : No/denies pain     BMI - recorded: 31.41 Nutritional Status: BMI > 30  Obese Nutritional Risks: None Diabetes: No  How often do you need to have someone help you when you read instructions, pamphlets, or other written materials from your doctor or pharmacy?: 1 - Never    Interpreter Needed?: No  Information entered by :: Clemetine Marker LPN   Activities of Daily Living In your present state of health, do you have any difficulty performing the following activities: 06/21/2020  Hearing? N  Comment scheduled for hearing evaluation  Vision? N  Difficulty concentrating or making decisions? N  Walking or climbing stairs? N  Dressing or bathing? N  Doing errands, shopping? N  Preparing Food and eating ? N  Using the Toilet? N  In the past six months, have you accidently leaked urine? Y  Do you have problems with loss of bowel control? N  Managing your Medications? N  Managing your Finances? N  Housekeeping or managing your Housekeeping? N  Some recent data might be hidden    Patient Care Team: Juline Patch, MD as PCP - General (Family Medicine)  Indicate any recent Medical Services you may have received from other than [facility] providers in the past year (date may be approximate).     Assessment:   This is a routine wellness examination for [place].  Hearing/Vision screen  Hearing Screening   125Hz  250Hz  500Hz  1000Hz  2000Hz  3000Hz  4000Hz  6000Hz  8000Hz   Right ear:           Left ear:            Comments: Pt c/o mild hearing difficulty; scheduled for hearing evaluation at [facility] this week   Vision Screening Comments: Annual vision screenings with Dr. Wyatt Portela  Dietary issues and exercise activities discussed: Current Exercise Habits: The patient has a physically strenuous job, but has no regular exercise apart from work., Exercise limited by: None identified  Goals    . DIET - INCREASE WATER INTAKE     Recommend to drink at least 6-8 8oz glasses of water per day.      Depression Screen PHQ 2/9 Scores 06/21/2020 02/16/2020 09/11/2019 06/18/2019 03/13/2019 09/11/2018 06/12/2018  PHQ - 2 Score 0 0 0 0 0 0 0  PHQ- 9 Score - 0 0 - 0 0 -    Fall Risk Fall Risk  06/21/2020 02/16/2020 09/11/2019 06/18/2019 06/12/2018  Falls in the past year? 0 0 0 0 0  Number falls in past yr: 0 - - 0 0  Injury with Fall? 0 - - 0 0  Risk for fall due to : No Fall Risks - - - -  Risk for fall due to: Comment - - - - -  Follow up Falls prevention discussed Falls evaluation completed Falls evaluation completed Falls prevention discussed Falls prevention discussed    FALL RISK PREVENTION PERTAINING TO THE HOME:  Any stairs in or around the home? Yes  If so, are there any without handrails? No  Home free of loose throw rugs in walkways, pet beds, electrical cords, etc? Yes  Adequate lighting in your home to reduce risk of falls? Yes   ASSISTIVE DEVICES UTILIZED TO PREVENT FALLS:  Life alert? No  Use of a cane, walker or w/c? No  Grab bars in the bathroom? No  Shower chair or bench in shower? Yes  Elevated toilet seat or a handicapped toilet? Yes   TIMED UP AND GO:  Was the test performed? Yes .  Length of time to ambulate 10 feet: 5 sec.   Gait steady and fast without use of assistive device  Cognitive Function:     6CIT Screen 06/21/2020 06/18/2019 06/12/2018 06/11/2017  What Year? 0 points 0 points 0 points 0 points  What month? 0 points 0 points 0 points 0 points  What time? 0 points 0  points 0 points 0 points  Count back from 20 0 points 0 points 0 points 0 points  Months in reverse 0 points 0 points 2 points 0 points  Repeat phrase 4 points 2 points 2 points 2 points  Total Score 4 2 4 2     Immunizations Immunization History  Administered Date(s) Administered  . Fluad Quad(high Dose 65+) 03/05/2019, 02/16/2020  . Influenza, High Dose Seasonal PF 12/12/2016, 01/29/2018  . Influenza,inj,Quad PF,6+ Mos 12/16/2014, 12/15/2015  . Influenza-Unspecified 12/22/2011, 01/15/2013  . PFIZER(Purple Top)SARS-COV-2 Vaccination 06/16/2019, 07/13/2019  . Pneumococcal Conjugate-13 08/22/2016  . Pneumococcal Polysaccharide-23 12/22/2011, 12/16/2014  . Tdap 06/22/2017  . Zoster Recombinat (Shingrix) 03/06/2018, 05/13/2018    TDAP status: Up to date  Flu Vaccine status: Up to date  Pneumococcal vaccine status: Up to date  Covid-19 vaccine status: Completed vaccines  Qualifies for Shingles Vaccine? Yes   Zostavax completed Yes   Shingrix Completed?: Yes  Screening Tests Health Maintenance  Topic Date Due  . COVID-19 Vaccine (3 - Booster for Pfizer series) 01/12/2020  . COLONOSCOPY (Pts 45-65yrs Insurance coverage will need to be confirmed)  07/12/2022  . TETANUS/TDAP  06/23/2027  . INFLUENZA VACCINE  Completed  . Hepatitis C Screening  Completed  . PNA vac Low Risk Adult  Completed  . HPV VACCINES  Aged Out    Health Maintenance  Health Maintenance Due  Topic Date Due  . COVID-19 Vaccine (3 - Booster for Pfizer series) 01/12/2020    Colorectal cancer screening: Type of screening: Colonoscopy. Completed 03/06/18. Repeat every 5 years  Lung Cancer Screening: (Low Dose CT Chest recommended if Age 4-80 years, 30 pack-year currently smoking OR have quit w/in 15years.) does not qualify.   Additional Screening:  Hepatitis C Screening: does qualify; Completed 03/13/19  Vision Screening: Recommended annual ophthalmology exams for early detection of glaucoma and other  disorders of the eye. Is the patient up to date with their annual eye exam?  Yes  Who is the provider or what is the name of the office  in which the patient attends annual eye exams? Dr. Wyatt Portela  Dental Screening: Recommended annual dental exams for proper oral hygiene  Community Resource Referral / Chronic Care Management: CRR required this visit?  No   CCM required this visit?  No      Plan:     I have personally reviewed and noted the following in the patient's chart:   . Medical and social history . Use of alcohol, tobacco or illicit drugs  . Current medications and supplements . Functional ability and status . Nutritional status . Physical activity . Advanced directives . List of other physicians . Hospitalizations, surgeries, and ER visits in previous 12 months . Vitals . Screenings to include cognitive, depression, and falls . Referrals and appointments  In addition, I have reviewed and discussed with patient certain preventive protocols, quality metrics, and best practice recommendations. A written personalized care plan for preventive services as well as general preventive health recommendations were provided to patient.     Clemetine Marker, LPN   2/29/7989   Nurse Notes: none

## 2020-06-21 NOTE — Patient Instructions (Signed)
Matthew Humphrey , Thank you for taking time to come for your Medicare Wellness Visit. I appreciate your ongoing commitment to your health goals. Please review the following plan we discussed and let me know if I can assist you in the future.   Screening recommendations/referrals: Colonoscopy: done 03/06/18. Repeat in 2024 Recommended yearly ophthalmology/optometry visit for glaucoma screening and checkup Recommended yearly dental visit for hygiene and checkup  Vaccinations: Influenza vaccine: done 02/16/20 Pneumococcal vaccine: done 08/22/16 Tdap vaccine: done 06/22/17 Shingles vaccine: done 03/06/18 & 05/13/18   Covid-19: done 06/16/19 & 07/13/19; please bring a copy of your vaccine record to your next appointment for booster information   Advanced directives: Advance directive discussed with you today. Even though you declined this today please call our office should you change your mind and we can give you the proper paperwork for you to fill out.  Conditions/risks identified: Recommend increasing physical activity   Next appointment: Follow up in one year for your annual wellness visit.   Preventive Care 45 Years and Older, Male Preventive care refers to lifestyle choices and visits with your health care provider that can promote health and wellness. What does preventive care include?  A yearly physical exam. This is also called an annual well check.  Dental exams once or twice a year.  Routine eye exams. Ask your health care provider how often you should have your eyes checked.  Personal lifestyle choices, including:  Daily care of your teeth and gums.  Regular physical activity.  Eating a healthy diet.  Avoiding tobacco and drug use.  Limiting alcohol use.  Practicing safe sex.  Taking low doses of aspirin every day.  Taking vitamin and mineral supplements as recommended by your health care provider. What happens during an annual well check? The services and screenings done  by your health care provider during your annual well check will depend on your age, overall health, lifestyle risk factors, and family history of disease. Counseling  Your health care provider may ask you questions about your:  Alcohol use.  Tobacco use.  Drug use.  Emotional well-being.  Home and relationship well-being.  Sexual activity.  Eating habits.  History of falls.  Memory and ability to understand (cognition).  Work and work Statistician. Screening  You may have the following tests or measurements:  Height, weight, and BMI.  Blood pressure.  Lipid and cholesterol levels. These may be checked every 5 years, or more frequently if you are over 54 years old.  Skin check.  Lung cancer screening. You may have this screening every year starting at age 6 if you have a 30-pack-year history of smoking and currently smoke or have quit within the past 15 years.  Fecal occult blood test (FOBT) of the stool. You may have this test every year starting at age 13.  Flexible sigmoidoscopy or colonoscopy. You may have a sigmoidoscopy every 5 years or a colonoscopy every 10 years starting at age 50.  Prostate cancer screening. Recommendations will vary depending on your family history and other risks.  Hepatitis C blood test.  Hepatitis B blood test.  Sexually transmitted disease (STD) testing.  Diabetes screening. This is done by checking your blood sugar (glucose) after you have not eaten for a while (fasting). You may have this done every 1-3 years.  Abdominal aortic aneurysm (AAA) screening. You may need this if you are a current or former smoker.  Osteoporosis. You may be screened starting at age 4 if you are at  high risk. Talk with your health care provider about your test results, treatment options, and if necessary, the need for more tests. Vaccines  Your health care provider may recommend certain vaccines, such as:  Influenza vaccine. This is recommended every  year.  Tetanus, diphtheria, and acellular pertussis (Tdap, Td) vaccine. You may need a Td booster every 10 years.  Zoster vaccine. You may need this after age 60.  Pneumococcal 13-valent conjugate (PCV13) vaccine. One dose is recommended after age 31.  Pneumococcal polysaccharide (PPSV23) vaccine. One dose is recommended after age 67. Talk to your health care provider about which screenings and vaccines you need and how often you need them. This information is not intended to replace advice given to you by your health care provider. Make sure you discuss any questions you have with your health care provider. Document Released: 04/16/2015 Document Revised: 12/08/2015 Document Reviewed: 01/19/2015 Elsevier Interactive Patient Education  2017 [place] Prevention in the Home Falls can cause injuries. They can happen to people of all ages. There are many things you can do to make your home safe and to help prevent falls. What can I do on the outside of my home?  Regularly fix the edges of walkways and driveways and fix any cracks.  Remove anything that might make you trip as you walk through a door, such as a raised step or threshold.  Trim any bushes or trees on the path to your home.  Use bright outdoor lighting.  Clear any walking paths of anything that might make someone trip, such as rocks or tools.  Regularly check to see if handrails are loose or broken. Make sure that both sides of any steps have handrails.  Any raised decks and porches should have guardrails on the edges.  Have any leaves, snow, or ice cleared regularly.  Use sand or salt on walking paths during winter.  Clean up any spills in your garage right away. This includes oil or grease spills. What can I do in the bathroom?  Use night lights.  Install grab bars by the toilet and in the tub and shower. Do not use towel bars as grab bars.  Use non-skid mats or decals in the tub or shower.  If you  need to sit down in the shower, use a plastic, non-slip stool.  Keep the floor dry. Clean up any water that spills on the floor as soon as it happens.  Remove soap buildup in the tub or shower regularly.  Attach bath mats securely with double-sided non-slip rug tape.  Do not have throw rugs and other things on the floor that can make you trip. What can I do in the bedroom?  Use night lights.  Make sure that you have a light by your bed that is easy to reach.  Do not use any sheets or blankets that are too big for your bed. They should not hang down onto the floor.  Have a firm chair that has side arms. You can use this for support while you get dressed.  Do not have throw rugs and other things on the floor that can make you trip. What can I do in the kitchen?  Clean up any spills right away.  Avoid walking on wet floors.  Keep items that you use a lot in easy-to-reach places.  If you need to reach something above you, use a strong step stool that has a grab bar.  Keep electrical cords out of the  way.  Do not use floor polish or wax that makes floors slippery. If you must use wax, use non-skid floor wax.  Do not have throw rugs and other things on the floor that can make you trip. What can I do with my stairs?  Do not leave any items on the stairs.  Make sure that there are handrails on both sides of the stairs and use them. Fix handrails that are broken or loose. Make sure that handrails are as long as the stairways.  Check any carpeting to make sure that it is firmly attached to the stairs. Fix any carpet that is loose or worn.  Avoid having throw rugs at the top or bottom of the stairs. If you do have throw rugs, attach them to the floor with carpet tape.  Make sure that you have a light switch at the top of the stairs and the bottom of the stairs. If you do not have them, ask someone to add them for you. What else can I do to help prevent falls?  Wear shoes  that:  Do not have high heels.  Have rubber bottoms.  Are comfortable and fit you well.  Are closed at the toe. Do not wear sandals.  If you use a stepladder:  Make sure that it is fully opened. Do not climb a closed stepladder.  Make sure that both sides of the stepladder are locked into place.  Ask someone to hold it for you, if possible.  Clearly mark and make sure that you can see:  Any grab bars or handrails.  First and last steps.  Where the edge of each step is.  Use tools that help you move around (mobility aids) if they are needed. These include:  Canes.  Walkers.  Scooters.  Crutches.  Turn on the lights when you go into a dark area. Replace any light bulbs as soon as they burn out.  Set up your furniture so you have a clear path. Avoid moving your furniture around.  If any of your floors are uneven, fix them.  If there are any pets around you, be aware of where they are.  Review your medicines with your doctor. Some medicines can make you feel dizzy. This can increase your chance of falling. Ask your doctor what other things that you can do to help prevent falls. This information is not intended to replace advice given to you by your health care provider. Make sure you discuss any questions you have with your health care provider. Document Released: 01/14/2009 Document Revised: 08/26/2015 Document Reviewed: 04/24/2014 Elsevier Interactive Patient Education  2017 [employer].

## 2020-08-17 ENCOUNTER — Ambulatory Visit (INDEPENDENT_AMBULATORY_CARE_PROVIDER_SITE_OTHER): Payer: 59 | Admitting: Family Medicine

## 2020-08-17 ENCOUNTER — Encounter: Payer: Self-pay | Admitting: Family Medicine

## 2020-08-17 ENCOUNTER — Other Ambulatory Visit: Payer: Self-pay

## 2020-08-17 DIAGNOSIS — I25810 Atherosclerosis of coronary artery bypass graft(s) without angina pectoris: Secondary | ICD-10-CM

## 2020-08-17 DIAGNOSIS — I10 Essential (primary) hypertension: Secondary | ICD-10-CM | POA: Diagnosis not present

## 2020-08-17 DIAGNOSIS — K219 Gastro-esophageal reflux disease without esophagitis: Secondary | ICD-10-CM

## 2020-08-17 DIAGNOSIS — E785 Hyperlipidemia, unspecified: Secondary | ICD-10-CM | POA: Diagnosis not present

## 2020-08-17 MED ORDER — PANTOPRAZOLE SODIUM 40 MG PO TBEC: 40.0000 mg | DELAYED_RELEASE_TABLET | Freq: Every day | ORAL | 1 refills | Status: DC

## 2020-08-17 MED ORDER — METOPROLOL TARTRATE 25 MG PO TABS: ORAL_TABLET | ORAL | 1 refills | Status: DC

## 2020-08-17 MED ORDER — ATORVASTATIN CALCIUM 80 MG PO TABS: 80.0000 mg | ORAL_TABLET | Freq: Every day | ORAL | 1 refills | Status: DC

## 2020-08-17 NOTE — Progress Notes (Signed)
Date:  08/17/2020   Name:  Matthew Humphrey   DOB:  1946/01/11   MRN:  [id number]   Chief Complaint: Gastroesophageal Reflux, Hyperlipidemia, and Hypertension  Gastroesophageal Reflux He reports no abdominal pain, no belching, no chest pain, no choking, no coughing, no dysphagia, no early satiety, no globus sensation, no heartburn, no hoarse voice, no nausea, no sore throat or no wheezing. This is a chronic problem. The current episode started more than 1 year ago. The problem has been gradually improving. He has tried a PPI for the symptoms.  Hyperlipidemia This is a chronic problem. The current episode started more than 1 year ago. Recent lipid tests were reviewed and are normal. Pertinent negatives include no chest pain, focal sensory loss, focal weakness, leg pain, myalgias or shortness of breath. Current antihyperlipidemic treatment includes statins.  Hypertension This is a chronic problem. The current episode started more than 1 year ago. The problem has been gradually improving since onset. The problem is controlled. Pertinent negatives include no anxiety, blurred vision, chest pain, headaches, malaise/fatigue, neck pain, orthopnea, palpitations, peripheral edema, PND, shortness of breath or sweats. Risk factors for coronary artery disease include dyslipidemia. Past treatments include beta blockers.    Lab Results  Component Value Date   CREATININE 1.58 (H) 02/16/2020   BUN 25 02/16/2020   NA 142 02/16/2020   K 4.6 02/16/2020   CL 106 02/16/2020   CO2 23 02/16/2020   Lab Results  Component Value Date   CHOL 187 02/16/2020   HDL 49 02/16/2020   LDLCALC 126 (H) 02/16/2020   TRIG 62 02/16/2020   CHOLHDL 3.9 03/12/2018   No results found for: TSH No results found for: HGBA1C Lab Results  Component Value Date   WBC 5.0 01/03/2013   HGB 14.4 01/03/2013   HCT 44.1 01/03/2013   MCV 90 01/03/2013   PLT 282 01/03/2013   Lab Results  Component Value Date   ALT 39  02/16/2020   AST 35 02/16/2020   ALKPHOS 111 02/16/2020   BILITOT 0.6 02/16/2020     Review of Systems  Constitutional: Negative for chills, fever and malaise/fatigue.  HENT: Negative for drooling, ear discharge, ear pain, hoarse voice and sore throat.   Eyes: Negative for blurred vision.  Respiratory: Negative for cough, choking, shortness of breath and wheezing.   Cardiovascular: Negative for chest pain, palpitations, orthopnea, leg swelling and PND.  Gastrointestinal: Negative for abdominal pain, blood in stool, constipation, diarrhea, dysphagia, heartburn and nausea.  Endocrine: Negative for polydipsia.  Genitourinary: Negative for dysuria, frequency, hematuria and urgency.  Musculoskeletal: Negative for back pain, myalgias and neck pain.  Skin: Negative for rash.  Allergic/Immunologic: Negative for environmental allergies.  Neurological: Negative for dizziness, focal weakness and headaches.  Hematological: Does not bruise/bleed easily.  Psychiatric/Behavioral: Negative for suicidal ideas. The patient is not nervous/anxious.     Patient Active Problem List   Diagnosis Date Noted  . Chronic kidney disease 06/12/2018  . Mediastinitis 06/12/2018  . MRSA infection 06/12/2018  . Peptic ulcer 06/12/2018  . Vertebral osteomyelitis ([place]) 06/12/2018  . Coronary artery disease involving autologous vein coronary bypass graft without angina pectoris 09/04/2017  . Cervical disc disease 09/04/2017  . Special screening for malignant neoplasms, colon   . Obesity (BMI 30.0-34.9) 12/20/2016  . Primary osteoarthritis of right knee 12/20/2016  . Primary osteoarthritis of both knees 08/22/2016  . Radicular leg pain 12/15/2015  . Essential hypertension 12/16/2014  . Hyperlipidemia 12/16/2014  . Esophageal reflux  12/16/2014  . Spinal stenosis in cervical region 11/27/2012  . Hx of colonoscopy with polypectomy 12/21/2011  . Impaired fasting glucose 08/29/2011    Allergies  Allergen  Reactions  . Sulfa Antibiotics Other (See Comments)  . Sulfamethoxazole-Trimethoprim Other (See Comments)    Other reaction(s): Unknown Other reaction(s): Unknown   . Sulfasalazine Other (See Comments)    Past Surgical History:  Procedure Laterality Date  . CARDIAC SURGERY  03/2010   bypass  . CERVICAL FUSION  12/11/2012   C4-5,C5-6,C6-7  . COLONOSCOPY  2011 ?  . COLONOSCOPY WITH PROPOFOL N/A 07/11/2017   Procedure: COLONOSCOPY WITH PROPOFOL;  Surgeon: Lin Landsman, MD;  Location: [place];  Service: Endoscopy;  Laterality: N/A;  . POLYPECTOMY N/A 07/11/2017   Procedure: POLYPECTOMY;  Surgeon: Lin Landsman, MD;  Location: [place];  Service: Endoscopy;  Laterality: N/A;    Social History   Tobacco Use  . Smoking status: Former Smoker    Packs/day: 1.50    Years: 30.00    Pack years: 45.00    Types: Cigarettes    Quit date: 1997    Years since quitting: 25.3  . Smokeless tobacco: Former Systems developer    Types: Chew  . Tobacco comment: smoking cessation materials not required  Vaping Use  . Vaping Use: Never used  Substance Use Topics  . Alcohol use: No    Alcohol/week: 0.0 standard drinks  . Drug use: No     Medication list has been reviewed and updated.  Current Meds  Medication Sig  . aspirin EC 81 MG tablet Take 1 tablet (81 mg total) by mouth daily.  Marland Kitchen atorvastatin (LIPITOR) 80 MG tablet Take 1 tablet (80 mg total) by mouth daily.  . metoprolol tartrate (LOPRESSOR) 25 MG tablet TAKE ONE-HALF TABLET BY  MOUTH TWICE A DAY  . pantoprazole (PROTONIX) 40 MG tablet Take 1 tablet (40 mg total) by mouth daily.    PHQ 2/9 Scores 08/17/2020 06/21/2020 02/16/2020 09/11/2019  PHQ - 2 Score 0 0 0 0  PHQ- 9 Score 0 - 0 0    GAD 7 : Generalized Anxiety Score 08/17/2020 02/16/2020 09/11/2019  Nervous, Anxious, on Edge 0 0 0  Control/stop worrying 0 0 0  Worry too much - different things 0 0 0  Trouble relaxing 0 0 0  Restless 0 0 0  Easily  annoyed or irritable 0 0 0  Afraid - awful might happen 0 0 0  Total GAD 7 Score 0 0 0    BP Readings from Last 3 Encounters:  08/17/20 136/82  06/21/20 (!) 142/80  02/16/20 130/80    Physical Exam Vitals and nursing note reviewed.  HENT:     Head: Normocephalic.     Right Ear: Tympanic membrane, ear canal and external ear normal.     Left Ear: Tympanic membrane, ear canal and external ear normal.     Nose: Nose normal.     Mouth/Throat:     Mouth: Mucous membranes are moist.  Eyes:     General: No scleral icterus.       Right eye: No discharge.        Left eye: No discharge.     Conjunctiva/sclera: Conjunctivae normal.     Pupils: Pupils are equal, round, and reactive to light.  Neck:     Thyroid: No thyromegaly.     Vascular: No JVD.     Trachea: No tracheal deviation.  Cardiovascular:     Rate and  Rhythm: Normal rate and regular rhythm.     Heart sounds: Normal heart sounds, S1 normal and S2 normal. No murmur heard.  No systolic murmur is present.  No diastolic murmur is present. No friction rub. No gallop. No S3 or S4 sounds.   Pulmonary:     Effort: No respiratory distress.     Breath sounds: Normal breath sounds. No wheezing or rales.  Abdominal:     General: Bowel sounds are normal.     Palpations: Abdomen is soft. There is no mass.     Tenderness: There is no abdominal tenderness. There is no guarding or rebound.  Musculoskeletal:        General: No tenderness. Normal range of motion.     Cervical back: Normal range of motion and neck supple.  Lymphadenopathy:     Cervical: No cervical adenopathy.  Skin:    General: Skin is warm.     Findings: No rash.  Neurological:     Mental Status: He is alert and oriented to person, place, and time.     Cranial Nerves: No cranial nerve deficit.     Deep Tendon Reflexes: Reflexes are normal and symmetric.     Wt Readings from Last 3 Encounters:  08/17/20 195 lb (88.5 kg)  06/21/20 194 lb 9.6 oz (88.3 kg)   02/16/20 189 lb (85.7 kg)    BP 136/82   Pulse 72   Ht 5\' 6"  (1.676 m)   Wt 195 lb (88.5 kg)   BMI 31.47 kg/m   Assessment and Plan:  1. Essential hypertension Chronic.  Controlled.  Stable.  Blood pressure is 136/82.  We will continue metoprolol 25 mg 1/2 tablet twice a day and will check renal function panel for GFR status. - Renal Function Panel - metoprolol tartrate (LOPRESSOR) 25 MG tablet; TAKE ONE-HALF TABLET BY  MOUTH TWICE A DAY  Dispense: 90 tablet; Refill: 1  2. Mild hyperlipidemia Chronic.  Controlled.  Stable.  Continue atorvastatin 80 mg once a day.  Will check lipid panel for current LDL status. - Lipid Panel With LDL/HDL Ratio - atorvastatin (LIPITOR) 80 MG tablet; Take 1 tablet (80 mg total) by mouth daily.  Dispense: 90 tablet; Refill: 1  3. Gastroesophageal reflux disease, unspecified whether esophagitis present Chronic.  Controlled.  Stable.  Continue pantoprazole 40 mg once a day. - pantoprazole (PROTONIX) 40 MG tablet; Take 1 tablet (40 mg total) by mouth daily.  Dispense: 90 tablet; Refill: 1  4. Coronary artery disease involving autologous vein coronary bypass graft without angina pectoris Chronic.  Controlled.  Stable.  Well-controlled current progression of CAD with control of his blood pressure, and lipid management. - atorvastatin (LIPITOR) 80 MG tablet; Take 1 tablet (80 mg total) by mouth daily.  Dispense: 90 tablet; Refill: 1 - metoprolol tartrate (LOPRESSOR) 25 MG tablet; TAKE ONE-HALF TABLET BY  MOUTH TWICE A DAY  Dispense: 90 tablet; Refill: 1

## 2020-08-18 LAB — RENAL FUNCTION PANEL
Albumin: 4.1 g/dL (ref 3.7–4.7)
BUN/Creatinine Ratio: 13 (ref 10–24)
BUN: 17 mg/dL (ref 8–27)
CO2: 22 mmol/L (ref 20–29)
Calcium: 9.7 mg/dL (ref 8.6–10.2)
Chloride: 105 mmol/L (ref 96–106)
Creatinine, Ser: 1.27 mg/dL (ref 0.76–1.27)
Glucose: 96 mg/dL (ref 65–99)
Phosphorus: 3 mg/dL (ref 2.8–4.1)
Potassium: 4.4 mmol/L (ref 3.5–5.2)
Sodium: 142 mmol/L (ref 134–144)
eGFR: 59 mL/min/{1.73_m2} — ABNORMAL LOW (ref >59)

## 2020-08-18 LAB — LIPID PANEL WITH LDL/HDL RATIO
Cholesterol, Total: 223 mg/dL — ABNORMAL HIGH (ref 100–199)
HDL: 51 mg/dL (ref >39)
LDL Chol Calc (NIH): 154 mg/dL — ABNORMAL HIGH (ref 0–99)
LDL/HDL Ratio: 3 ratio (ref 0.0–3.6)
Triglycerides: 99 mg/dL (ref 0–149)
VLDL Cholesterol Cal: 18 mg/dL (ref 5–40)

## 2020-09-17 ENCOUNTER — Ambulatory Visit: Payer: 59 | Admitting: Family Medicine

## 2021-01-11 ENCOUNTER — Other Ambulatory Visit: Payer: Self-pay | Admitting: Family Medicine

## 2021-01-11 DIAGNOSIS — I10 Essential (primary) hypertension: Secondary | ICD-10-CM

## 2021-01-11 DIAGNOSIS — E785 Hyperlipidemia, unspecified: Secondary | ICD-10-CM

## 2021-01-11 DIAGNOSIS — I25810 Atherosclerosis of coronary artery bypass graft(s) without angina pectoris: Secondary | ICD-10-CM

## 2021-01-11 DIAGNOSIS — K219 Gastro-esophageal reflux disease without esophagitis: Secondary | ICD-10-CM

## 2021-01-11 NOTE — Telephone Encounter (Signed)
Requested Prescriptions  Pending Prescriptions Disp Refills  . pantoprazole (PROTONIX) 40 MG tablet [Pharmacy Med Name: Pantoprazole Sodium 40 MG Oral Tablet Delayed Release] 90 tablet 2    Sig: TAKE 1 TABLET BY MOUTH  DAILY     Gastroenterology: Proton Pump Inhibitors Passed - 01/11/2021  7:26 AM      Passed - Valid encounter within last 12 months    Recent Outpatient Visits          4 months ago Essential hypertension   [facility] Juline Patch, MD   11 months ago Essential hypertension   [facility] Juline Patch, MD   1 year ago Coronary artery disease involving autologous vein coronary bypass graft without angina pectoris   [facility] Juline Patch, MD   1 year ago Essential hypertension   [facility] Juline Patch, MD   2 years ago Essential hypertension   [facility] Juline Patch, MD             . metoprolol tartrate (LOPRESSOR) 25 MG tablet [Pharmacy Med Name: Metoprolol Tartrate 25 MG Oral Tablet] 90 tablet 0    Sig: TAKE ONE-HALF TABLET BY  MOUTH TWICE DAILY     Cardiovascular:  Beta Blockers Passed - 01/11/2021  7:26 AM      Passed - Last BP in normal range    BP Readings from Last 1 Encounters:  08/17/20 136/82         Passed - Last Heart Rate in normal range    Pulse Readings from Last 1 Encounters:  08/17/20 72         Passed - Valid encounter within last 6 months    Recent Outpatient Visits          4 months ago Essential hypertension   [facility] Juline Patch, MD   11 months ago Essential hypertension   [facility] Juline Patch, MD   1 year ago Coronary artery disease involving autologous vein coronary bypass graft without angina pectoris   [facility] Juline Patch, MD   1 year ago Essential hypertension   [facility] Juline Patch, MD   2 years ago Essential hypertension   [facility] Juline Patch, MD             .  atorvastatin (LIPITOR) 80 MG tablet [Pharmacy Med Name: Atorvastatin Calcium 80 MG Oral Tablet] 90 tablet 2    Sig: TAKE 1 TABLET BY MOUTH  DAILY     Cardiovascular:  Antilipid - Statins Failed - 01/11/2021  7:26 AM      Failed - Total Cholesterol in normal range and within 360 days    Cholesterol, Total  Date Value Ref Range Status  08/17/2020 223 (H) 100 - 199 mg/dL Final   Cholesterol  Date Value Ref Range Status  03/28/2012 141 0 - 200 mg/dL Final         Failed - LDL in normal range and within 360 days    Ldl Cholesterol, Calc  Date Value Ref Range Status  03/28/2012 80 0 - 100 mg/dL Final   LDL Chol Calc (NIH)  Date Value Ref Range Status  08/17/2020 154 (H) 0 - 99 mg/dL Final         Passed - HDL in normal range and within 360 days    HDL Cholesterol  Date Value Ref Range Status  03/28/2012 42 40 -  60 mg/dL Final   HDL  Date Value Ref Range Status  08/17/2020 51 >39 mg/dL Final         Passed - Triglycerides in normal range and within 360 days    Triglycerides  Date Value Ref Range Status  08/17/2020 99 0 - 149 mg/dL Final  03/28/2012 96 0 - 200 mg/dL Final         Passed - Patient is not pregnant      Passed - Valid encounter within last 12 months    Recent Outpatient Visits          4 months ago Essential hypertension   [place], Deanna C, MD   11 months ago Essential hypertension   [place], Deanna C, MD   1 year ago Coronary artery disease involving autologous vein coronary bypass graft without angina pectoris   [facility] Juline Patch, MD   1 year ago Essential hypertension   [facility] Juline Patch, MD   2 years ago Essential hypertension   [facility] Juline Patch, MD

## 2021-04-13 ENCOUNTER — Other Ambulatory Visit: Payer: Self-pay | Admitting: Family Medicine

## 2021-04-13 DIAGNOSIS — I10 Essential (primary) hypertension: Secondary | ICD-10-CM

## 2021-04-13 DIAGNOSIS — I25810 Atherosclerosis of coronary artery bypass graft(s) without angina pectoris: Secondary | ICD-10-CM

## 2021-04-13 NOTE — Telephone Encounter (Signed)
Courtesy refill. Pt needs an office visit for further refills. Requested Prescriptions  Pending Prescriptions Disp Refills   metoprolol tartrate (LOPRESSOR) 25 MG tablet [Pharmacy Med Name: Metoprolol Tartrate 25 MG Oral Tablet] 30 tablet 0    Sig: TAKE ONE-HALF TABLET BY  MOUTH TWICE DAILY     Cardiovascular:  Beta Blockers Failed - 04/13/2021  6:41 AM      Failed - Valid encounter within last 6 months    Recent Outpatient Visits          7 months ago Essential hypertension   [facility] Juline Patch, MD   1 year ago Essential hypertension   [facility] Juline Patch, MD   1 year ago Coronary artery disease involving autologous vein coronary bypass graft without angina pectoris   [facility] Juline Patch, [facility]   2 years ago Essential hypertension   [facility] Juline Patch, MD   2 years ago Essential hypertension   [place], Deanna C, MD             Passed - Last BP in normal range    BP Readings from Last 1 Encounters:  08/17/20 136/82         Passed - Last Heart Rate in normal range    Pulse Readings from Last 1 Encounters:  08/17/20 72

## 2021-05-18 ENCOUNTER — Other Ambulatory Visit: Payer: Self-pay | Admitting: Family Medicine

## 2021-05-18 DIAGNOSIS — I25810 Atherosclerosis of coronary artery bypass graft(s) without angina pectoris: Secondary | ICD-10-CM

## 2021-05-18 DIAGNOSIS — I10 Essential (primary) hypertension: Secondary | ICD-10-CM

## 2021-05-19 ENCOUNTER — Encounter: Payer: Self-pay | Admitting: Family Medicine

## 2021-05-19 ENCOUNTER — Other Ambulatory Visit: Payer: Self-pay

## 2021-05-19 ENCOUNTER — Ambulatory Visit (INDEPENDENT_AMBULATORY_CARE_PROVIDER_SITE_OTHER): Payer: 59 | Admitting: Family Medicine

## 2021-05-19 VITALS — BP 130/60 | HR 72 | Ht 66.0 in | Wt 194.0 lb

## 2021-05-19 DIAGNOSIS — I10 Essential (primary) hypertension: Secondary | ICD-10-CM

## 2021-05-19 DIAGNOSIS — I25810 Atherosclerosis of coronary artery bypass graft(s) without angina pectoris: Secondary | ICD-10-CM | POA: Diagnosis not present

## 2021-05-19 DIAGNOSIS — E785 Hyperlipidemia, unspecified: Secondary | ICD-10-CM | POA: Diagnosis not present

## 2021-05-19 DIAGNOSIS — K219 Gastro-esophageal reflux disease without esophagitis: Secondary | ICD-10-CM | POA: Diagnosis not present

## 2021-05-19 MED ORDER — METOPROLOL TARTRATE 25 MG PO TABS: 12.5000 mg | ORAL_TABLET | Freq: Two times a day (BID) | ORAL | 1 refills | Status: DC

## 2021-05-19 MED ORDER — ATORVASTATIN CALCIUM 80 MG PO TABS: 80.0000 mg | ORAL_TABLET | Freq: Every day | ORAL | 1 refills | Status: DC

## 2021-05-19 MED ORDER — PANTOPRAZOLE SODIUM 40 MG PO TBEC: 40.0000 mg | DELAYED_RELEASE_TABLET | Freq: Every day | ORAL | 1 refills | Status: DC

## 2021-05-19 NOTE — Progress Notes (Signed)
Date:  05/19/2021   Name:  Matthew Humphrey   DOB:  July 21, 1945   MRN:  [id number]   Chief Complaint: Gastroesophageal Reflux, Hypertension, and Hyperlipidemia  Gastroesophageal Reflux He reports no abdominal pain, no belching, no chest pain, no choking, no coughing, no dysphagia, no early satiety, no globus sensation, no heartburn, no hoarse voice, no nausea, no sore throat, no water brash or no wheezing. This is a chronic problem. The current episode started more than 1 year ago. The problem occurs rarely. The problem has been gradually improving. Nothing aggravates the symptoms. Pertinent negatives include no anemia, fatigue, melena, muscle weakness, orthopnea or weight loss. He has tried a PPI for the symptoms. The treatment provided moderate relief.  Hypertension This is a chronic problem. The current episode started more than 1 year ago. The problem has been waxing and waning since onset. The problem is controlled. Pertinent negatives include no anxiety, blurred vision, chest pain, headaches, neck pain, palpitations, PND or shortness of breath. There are no associated agents to hypertension. Risk factors for coronary artery disease include dyslipidemia. Past treatments include beta blockers. The current treatment provides moderate improvement. There are no compliance problems.  There is no history of angina, kidney disease, CAD/MI, CVA, heart failure, left ventricular hypertrophy, PVD or retinopathy. There is no history of chronic renal disease, a hypertension causing med or renovascular disease.  Hyperlipidemia This is a chronic problem. The current episode started more than 1 year ago. The problem is controlled. He has no history of chronic renal disease, diabetes, hypothyroidism, liver disease, obesity or nephrotic syndrome. Pertinent negatives include no chest pain, myalgias or shortness of breath. Current antihyperlipidemic treatment includes statins. The current treatment provides moderate  improvement of lipids. There are no compliance problems.    Lab Results  Component Value Date   NA 142 08/17/2020   K 4.4 08/17/2020   CO2 22 08/17/2020   GLUCOSE 96 08/17/2020   BUN 17 08/17/2020   CREATININE 1.27 08/17/2020   CALCIUM 9.7 08/17/2020   EGFR 59 (L) 08/17/2020   GFRNONAA 43 (L) 02/16/2020   Lab Results  Component Value Date   CHOL 223 (H) 08/17/2020   HDL 51 08/17/2020   LDLCALC 154 (H) 08/17/2020   TRIG 99 08/17/2020   CHOLHDL 3.9 03/12/2018   No results found for: TSH No results found for: HGBA1C Lab Results  Component Value Date   WBC 5.0 01/03/2013   HGB 14.4 01/03/2013   HCT 44.1 01/03/2013   MCV 90 01/03/2013   PLT 282 01/03/2013   Lab Results  Component Value Date   ALT 39 02/16/2020   AST 35 02/16/2020   ALKPHOS 111 02/16/2020   BILITOT 0.6 02/16/2020   No results found for: 25OHVITD2, 25OHVITD3, VD25OH   Review of Systems  Constitutional:  Negative for chills, fatigue, fever and weight loss.  HENT:  Negative for congestion, drooling, ear discharge, ear pain, hoarse voice and sore throat.   Eyes:  Negative for blurred vision.  Respiratory:  Negative for cough, choking, shortness of breath and wheezing.   Cardiovascular:  Negative for chest pain, palpitations, leg swelling and PND.  Gastrointestinal:  Negative for abdominal pain, blood in stool, constipation, diarrhea, dysphagia, heartburn, melena and nausea.  Endocrine: Negative for polydipsia.  Genitourinary:  Negative for dysuria, frequency, hematuria and urgency.       Slow stream  Musculoskeletal:  Negative for back pain, myalgias, muscle weakness and neck pain.  Skin:  Negative for rash.  Allergic/Immunologic: Negative for environmental allergies.  Neurological:  Negative for dizziness and headaches.  Hematological:  Does not bruise/bleed easily.  Psychiatric/Behavioral:  Negative for suicidal ideas. The patient is not nervous/anxious.    Patient Active Problem List   Diagnosis  Date Noted   Chronic kidney disease 06/12/2018   Mediastinitis 06/12/2018   MRSA infection 06/12/2018   Peptic ulcer 06/12/2018   Vertebral osteomyelitis ([place]) 06/12/2018   Coronary artery disease involving autologous vein coronary bypass graft without angina pectoris 09/04/2017   Cervical disc disease 09/04/2017   Special screening for malignant neoplasms, colon    Obesity (BMI 30.0-34.9) 12/20/2016   Primary osteoarthritis of right knee 12/20/2016   Primary osteoarthritis of both knees 08/22/2016   Radicular leg pain 12/15/2015   Essential hypertension 12/16/2014   Hyperlipidemia 12/16/2014   Esophageal reflux 12/16/2014   Spinal stenosis in cervical region 11/27/2012   Hx of colonoscopy with polypectomy 12/21/2011   Impaired fasting glucose 08/29/2011    Allergies  Allergen Reactions   Sulfa Antibiotics Other (See Comments)   Sulfamethoxazole-Trimethoprim Other (See Comments)    Other reaction(s): Unknown Other reaction(s): Unknown    Sulfasalazine Other (See Comments)    Past Surgical History:  Procedure Laterality Date   CARDIAC SURGERY  03/2010   bypass   CERVICAL FUSION  12/11/2012   C4-5,C5-6,C6-7   COLONOSCOPY  2011 ?   COLONOSCOPY WITH PROPOFOL N/A 07/11/2017   Procedure: COLONOSCOPY WITH PROPOFOL;  Surgeon: Lin Landsman, MD;  Location: [place];  Service: Endoscopy;  Laterality: N/A;   POLYPECTOMY N/A 07/11/2017   Procedure: POLYPECTOMY;  Surgeon: Lin Landsman, MD;  Location: [place];  Service: Endoscopy;  Laterality: N/A;    Social History   Tobacco Use   Smoking status: Former    Packs/day: 1.50    Years: 30.00    Pack years: 45.00    Types: Cigarettes    Quit date: 1997    Years since quitting: 26.1   Smokeless tobacco: Former    Types: Chew   Tobacco comments:    smoking cessation materials not required  Vaping Use   Vaping Use: Never used  Substance Use Topics   Alcohol use: No    Alcohol/week: 0.0  standard drinks   Drug use: No     Medication list has been reviewed and updated.  Current Meds  Medication Sig   aspirin EC 81 MG tablet Take 1 tablet (81 mg total) by mouth daily.   atorvastatin (LIPITOR) 80 MG tablet TAKE 1 TABLET BY MOUTH  DAILY   metoprolol tartrate (LOPRESSOR) 25 MG tablet TAKE ONE-HALF TABLET BY  MOUTH TWICE DAILY   pantoprazole (PROTONIX) 40 MG tablet TAKE 1 TABLET BY MOUTH  DAILY    PHQ 2/9 Scores 05/19/2021 08/17/2020 06/21/2020 02/16/2020  PHQ - 2 Score 0 0 0 0  PHQ- 9 Score 0 0 - 0    GAD 7 : Generalized Anxiety Score 05/19/2021 08/17/2020 02/16/2020 09/11/2019  Nervous, Anxious, on Edge 0 0 0 0  Control/stop worrying 0 0 0 0  Worry too much - different things 0 0 0 0  Trouble relaxing 0 0 0 0  Restless 0 0 0 0  Easily annoyed or irritable 0 0 0 0  Afraid - awful might happen 0 0 0 0  Total GAD 7 Score 0 0 0 0  Anxiety Difficulty Not difficult at all - - -    BP Readings from Last 3 Encounters:  05/19/21 130/60  08/17/20 136/82  06/21/20 (!) 142/80    Physical Exam Vitals reviewed.  HENT:     Head: Normocephalic.     Right Ear: Tympanic membrane, ear canal and external ear normal.     Left Ear: Tympanic membrane, ear canal and external ear normal.     Nose: Nose normal.  Eyes:     General: No scleral icterus.       Right eye: No discharge.        Left eye: No discharge.     Conjunctiva/sclera: Conjunctivae normal.     Pupils: Pupils are equal, round, and reactive to light.  Neck:     Thyroid: No thyromegaly.     Vascular: No JVD.     Trachea: No tracheal deviation.  Cardiovascular:     Rate and Rhythm: Normal rate and regular rhythm.     Heart sounds: Normal heart sounds, S1 normal and S2 normal. No murmur heard. No systolic murmur is present.  No diastolic murmur is present.    No friction rub. No gallop. No S3 or S4 sounds.  Pulmonary:     Effort: No respiratory distress.     Breath sounds: Normal breath sounds. No wheezing,  rhonchi or rales.  Chest:     Chest wall: No tenderness.  Abdominal:     General: Bowel sounds are normal.     Palpations: Abdomen is soft. There is no mass.     Tenderness: There is no abdominal tenderness. There is no guarding or rebound.  Musculoskeletal:        General: No tenderness. Normal range of motion.     Cervical back: Normal range of motion and neck supple.  Lymphadenopathy:     Cervical: No cervical adenopathy.  Skin:    General: Skin is warm.     Findings: No rash.  Neurological:     Mental Status: He is alert and oriented to person, place, and time.     Cranial Nerves: No cranial nerve deficit.     Deep Tendon Reflexes: Reflexes are normal and symmetric.    Wt Readings from Last 3 Encounters:  05/19/21 194 lb (88 kg)  08/17/20 195 lb (88.5 kg)  06/21/20 194 lb 9.6 oz (88.3 kg)    BP 130/60    Pulse 72    Ht '5\' 6"'  (1.676 m)    Wt 194 lb (88 kg)    BMI 31.31 kg/m   Assessment and Plan:  1. Essential hypertension Chronic.  Controlled.  Stable.  Continue metoprolol 25 mg 1/2 tablet twice a day.  Will check CMP for electrolytes and GFR. - metoprolol tartrate (LOPRESSOR) 25 MG tablet; Take 0.5 tablets (12.5 mg total) by mouth 2 (two) times daily.  Dispense: 90 tablet; Refill: 1 - Comprehensive Metabolic Panel (CMET)  2. Mild hyperlipidemia Chronic.  Controlled.  Stable.  Continue atorvastatin 80 mg once a day.  Will check lipid panel for current status of the LDL. - atorvastatin (LIPITOR) 80 MG tablet; Take 1 tablet (80 mg total) by mouth daily.  Dispense: 90 tablet; Refill: 1 - Lipid Panel With LDL/HDL Ratio  3. Gastroesophageal reflux disease, unspecified whether esophagitis present Chronic.  Controlled.  Stable.  Continue pantoprazole 40 mg once a day. - pantoprazole (PROTONIX) 40 MG tablet; Take 1 tablet (40 mg total) by mouth daily.  Dispense: 90 tablet; Refill: 1  4. Coronary artery disease involving autologous vein coronary bypass graft without angina  pectoris Chronic.  Stable.  Controlled.  No episodes of  chest pain, palpitation, or near syncope.  Patient is tolerating blood pressure medication and lipid therapy as well. - atorvastatin (LIPITOR) 80 MG tablet; Take 1 tablet (80 mg total) by mouth daily.  Dispense: 90 tablet; Refill: 1 - metoprolol tartrate (LOPRESSOR) 25 MG tablet; Take 0.5 tablets (12.5 mg total) by mouth 2 (two) times daily.  Dispense: 90 tablet; Refill: 1

## 2021-05-20 LAB — COMPREHENSIVE METABOLIC PANEL
ALT: 41 IU/L (ref 0–44)
AST: 41 IU/L — ABNORMAL HIGH (ref 0–40)
Albumin/Globulin Ratio: 1.7 (ref 1.2–2.2)
Albumin: 4.1 g/dL (ref 3.7–4.7)
Alkaline Phosphatase: 101 IU/L (ref 44–121)
BUN/Creatinine Ratio: 14 (ref 10–24)
BUN: 19 mg/dL (ref 8–27)
Bilirubin Total: 1 mg/dL (ref 0.0–1.2)
CO2: 23 mmol/L (ref 20–29)
Calcium: 9.5 mg/dL (ref 8.6–10.2)
Chloride: 106 mmol/L (ref 96–106)
Creatinine, Ser: 1.38 mg/dL — ABNORMAL HIGH (ref 0.76–1.27)
Globulin, Total: 2.4 g/dL (ref 1.5–4.5)
Glucose: 100 mg/dL — ABNORMAL HIGH (ref 70–99)
Potassium: 4.4 mmol/L (ref 3.5–5.2)
Sodium: 142 mmol/L (ref 134–144)
Total Protein: 6.5 g/dL (ref 6.0–8.5)
eGFR: 54 mL/min/{1.73_m2} — ABNORMAL LOW (ref >59)

## 2021-05-20 LAB — LIPID PANEL WITH LDL/HDL RATIO
Cholesterol, Total: 139 mg/dL (ref 100–199)
HDL: 51 mg/dL (ref >39)
LDL Chol Calc (NIH): 75 mg/dL (ref 0–99)
LDL/HDL Ratio: 1.5 ratio (ref 0.0–3.6)
Triglycerides: 62 mg/dL (ref 0–149)
VLDL Cholesterol Cal: 13 mg/dL (ref 5–40)

## 2021-06-22 ENCOUNTER — Ambulatory Visit: Payer: Medicare HMO

## 2021-07-05 ENCOUNTER — Telehealth: Payer: Self-pay | Admitting: Family Medicine

## 2021-07-05 NOTE — Telephone Encounter (Signed)
Copied from [place] [phone]. Topic: Medicare AWV ?>> Jul 05, 2021  1:44 PM Cher Nakai R wrote: ?Reason for CRM:  ?Left message for patient to call back and schedule Medicare Annual Wellness Visit (AWV) in office.  ? ?If unable to come into the office for AWV,  please offer to do virtually or by telephone. ? ?Last AWV: 06/21/2020 ? ?Please schedule at anytime with [facility] Health Advisor.     ? ?30 minute appointment for Virtual or phone ?45 minute appointment for in office or Initial virtual/phone ? ?Any questions, please call me at [phone] ?

## 2021-08-16 ENCOUNTER — Telehealth: Payer: Self-pay | Admitting: Family Medicine

## 2021-08-16 NOTE — Telephone Encounter (Signed)
Copied from [place] [id number]. Topic: Medicare AWV ?>> Aug 16, 2021  3:42 PM Cher Nakai R wrote: ?Reason for CRM:  ?Left message for patient to call back and schedule Medicare Annual Wellness Visit (AWV) in office.  ? ?If unable to come into the office for AWV,  please offer to do virtually or by telephone. ? ?Last AWV: 06/21/2020 ?? ?Please schedule at anytime with [facility] Health Advisor.     ? ?30 minute appointment for Virtual or phone ?45 minute appointment for in office or Initial virtual/phone ? ?Any questions, please call me at [phone] ?

## 2021-09-06 ENCOUNTER — Telehealth: Payer: Self-pay | Admitting: Family Medicine

## 2021-09-06 NOTE — Telephone Encounter (Signed)
Copied from [place] [id number]. Topic: Medicare AWV >> Sep 06, 2021 11:03 AM Cher Nakai R wrote: Reason for CRM:  Left message for patient to call back and schedule Medicare Annual Wellness Visit (AWV) in office.   If unable to come into the office for AWV,  please offer to do virtually or by telephone.  Last AWV: 06/21/2020  Please schedule at anytime with [facility] Health Advisor.      30 minute appointment for Virtual or phone 45 minute appointment for in office or Initial virtual/phone  Any questions, please call me at [phone]

## 2021-09-14 DIAGNOSIS — H25013 Cortical age-related cataract, bilateral: Secondary | ICD-10-CM | POA: Diagnosis not present

## 2021-09-20 ENCOUNTER — Telehealth: Payer: Self-pay | Admitting: Family Medicine

## 2021-09-20 NOTE — Telephone Encounter (Signed)
Copied from [place] [id number]. Topic: Medicare AWV >> Sep 20, 2021  1:41 PM Jae Dire wrote: Reason for CRM:  Left message for patient to call back and schedule Medicare Annual Wellness Visit (AWV)  Please offer to do virtually or by telephone.  Last AWV: 06/21/2020  Please schedule at anytime with [facility] Health Advisor.      30 minute appointment for Virtual or phone   Any questions, please call me at [phone]

## 2021-09-23 ENCOUNTER — Other Ambulatory Visit: Payer: Self-pay | Admitting: Family Medicine

## 2021-09-23 DIAGNOSIS — E785 Hyperlipidemia, unspecified: Secondary | ICD-10-CM

## 2021-09-23 DIAGNOSIS — I25810 Atherosclerosis of coronary artery bypass graft(s) without angina pectoris: Secondary | ICD-10-CM

## 2021-09-23 DIAGNOSIS — K219 Gastro-esophageal reflux disease without esophagitis: Secondary | ICD-10-CM

## 2021-10-05 ENCOUNTER — Other Ambulatory Visit: Payer: Self-pay | Admitting: Family Medicine

## 2021-10-05 DIAGNOSIS — I25810 Atherosclerosis of coronary artery bypass graft(s) without angina pectoris: Secondary | ICD-10-CM

## 2021-10-05 DIAGNOSIS — I10 Essential (primary) hypertension: Secondary | ICD-10-CM

## 2021-10-05 NOTE — Telephone Encounter (Signed)
Requested Prescriptions  Pending Prescriptions Disp Refills  . metoprolol tartrate (LOPRESSOR) 25 MG tablet [Pharmacy Med Name: Metoprolol Tartrate 25 MG Oral Tablet] 100 tablet 2    Sig: TAKE ONE-HALF TABLET BY MOUTH  TWICE DAILY     Cardiovascular:  Beta Blockers Passed - 10/05/2021  7:56 AM      Passed - Last BP in normal range    BP Readings from Last 1 Encounters:  05/19/21 130/60         Passed - Last Heart Rate in normal range    Pulse Readings from Last 1 Encounters:  05/19/21 72         Passed - Valid encounter within last 6 months    Recent Outpatient Visits          4 months ago Essential hypertension   [facility] Juline Patch, MD   1 year ago Essential hypertension   [place], Deanna C, MD   1 year ago Essential hypertension   [facility] Juline Patch, MD   2 years ago Coronary artery disease involving autologous vein coronary bypass graft without angina pectoris   [facility] Juline Patch, MD   2 years ago Essential hypertension   [place], Deanna C, MD      Future Appointments            In 1 month Juline Patch, MD [facility], [facility]

## 2021-11-16 ENCOUNTER — Encounter: Payer: Self-pay | Admitting: Family Medicine

## 2021-11-16 ENCOUNTER — Ambulatory Visit (INDEPENDENT_AMBULATORY_CARE_PROVIDER_SITE_OTHER): Payer: Medicare Other | Admitting: Family Medicine

## 2021-11-16 DIAGNOSIS — K219 Gastro-esophageal reflux disease without esophagitis: Secondary | ICD-10-CM

## 2021-11-16 DIAGNOSIS — I25810 Atherosclerosis of coronary artery bypass graft(s) without angina pectoris: Secondary | ICD-10-CM | POA: Diagnosis not present

## 2021-11-16 DIAGNOSIS — E785 Hyperlipidemia, unspecified: Secondary | ICD-10-CM

## 2021-11-16 DIAGNOSIS — I10 Essential (primary) hypertension: Secondary | ICD-10-CM | POA: Diagnosis not present

## 2021-11-16 MED ORDER — PANTOPRAZOLE SODIUM 40 MG PO TBEC: 40.0000 mg | DELAYED_RELEASE_TABLET | Freq: Every day | ORAL | 1 refills | Status: DC

## 2021-11-16 MED ORDER — ATORVASTATIN CALCIUM 80 MG PO TABS: 80.0000 mg | ORAL_TABLET | Freq: Every day | ORAL | 1 refills | Status: DC

## 2021-11-16 MED ORDER — METOPROLOL TARTRATE 25 MG PO TABS: 12.5000 mg | ORAL_TABLET | Freq: Two times a day (BID) | ORAL | 1 refills | Status: DC

## 2021-11-16 NOTE — Progress Notes (Signed)
Date:  11/16/2021   Name:  Matthew Humphrey   DOB:  January 05, 1946   MRN:  [id number]   Chief Complaint: Gastroesophageal Reflux, Hypertension, and Hyperlipidemia  Gastroesophageal Reflux He reports no abdominal pain, no chest pain, no choking, no coughing, no dysphagia, no heartburn, no nausea, no sore throat or no wheezing. This is a chronic problem. The current episode started more than 1 year ago. The problem has been gradually improving. There are no known risk factors. He has tried a PPI for the symptoms. The treatment provided moderate relief.  Hypertension This is a chronic problem. The current episode started more than 1 year ago. The problem has been gradually improving since onset. The problem is controlled. Pertinent negatives include no blurred vision, chest pain, headaches, malaise/fatigue, neck pain, orthopnea, palpitations or shortness of breath. Risk factors for coronary artery disease include post-menopausal state and obesity. Past treatments include beta blockers. The current treatment provides moderate improvement. There are no compliance problems.  There is no history of angina, kidney disease, CAD/MI, CVA, heart failure, left ventricular hypertrophy, PVD or retinopathy. There is no history of chronic renal disease, a hypertension causing med or renovascular disease.  Hyperlipidemia This is a chronic problem. The current episode started more than 1 year ago. The problem is controlled. He has no history of chronic renal disease. Factors aggravating his hyperlipidemia include thiazides. Pertinent negatives include no chest pain, myalgias or shortness of breath. Current antihyperlipidemic treatment includes statins. The current treatment provides moderate improvement of lipids. Risk factors for coronary artery disease include dyslipidemia.    Lab Results  Component Value Date   NA 142 05/19/2021   K 4.4 05/19/2021   CO2 23 05/19/2021   GLUCOSE 100 (H) 05/19/2021   BUN 19  05/19/2021   CREATININE 1.38 (H) 05/19/2021   CALCIUM 9.5 05/19/2021   EGFR 54 (L) 05/19/2021   GFRNONAA 43 (L) 02/16/2020   Lab Results  Component Value Date   CHOL 139 05/19/2021   HDL 51 05/19/2021   LDLCALC 75 05/19/2021   TRIG 62 05/19/2021   CHOLHDL 3.9 03/12/2018   No results found for: "TSH" No results found for: "HGBA1C" Lab Results  Component Value Date   WBC 5.0 01/03/2013   HGB 14.4 01/03/2013   HCT 44.1 01/03/2013   MCV 90 01/03/2013   PLT 282 01/03/2013   Lab Results  Component Value Date   ALT 41 05/19/2021   AST 41 (H) 05/19/2021   ALKPHOS 101 05/19/2021   BILITOT 1.0 05/19/2021   No results found for: "25OHVITD2", "25OHVITD3", "VD25OH"   Review of Systems  Constitutional:  Negative for chills, fever and malaise/fatigue.  HENT:  Negative for drooling, ear discharge, ear pain and sore throat.   Eyes:  Negative for blurred vision.  Respiratory:  Negative for cough, choking, shortness of breath and wheezing.   Cardiovascular:  Negative for chest pain, palpitations, orthopnea and leg swelling.  Gastrointestinal:  Negative for abdominal pain, blood in stool, constipation, diarrhea, dysphagia, heartburn and nausea.  Endocrine: Negative for polydipsia.  Genitourinary:  Negative for dysuria, frequency, hematuria and urgency.  Musculoskeletal:  Negative for back pain, myalgias and neck pain.  Skin:  Negative for rash.  Allergic/Immunologic: Negative for environmental allergies.  Neurological:  Negative for dizziness and headaches.  Hematological:  Does not bruise/bleed easily.  Psychiatric/Behavioral:  Negative for suicidal ideas. The patient is not nervous/anxious.     Patient Active Problem List   Diagnosis Date Noted   Chronic  kidney disease 06/12/2018   Mediastinitis 06/12/2018   MRSA infection 06/12/2018   Peptic ulcer 06/12/2018   Vertebral osteomyelitis ([place]) 06/12/2018   Coronary artery disease involving autologous vein coronary bypass graft  without angina pectoris 09/04/2017   Cervical disc disease 09/04/2017   Special screening for malignant neoplasms, colon    Obesity (BMI 30.0-34.9) 12/20/2016   Primary osteoarthritis of right knee 12/20/2016   Primary osteoarthritis of both knees 08/22/2016   Radicular leg pain 12/15/2015   Essential hypertension 12/16/2014   Hyperlipidemia 12/16/2014   Esophageal reflux 12/16/2014   Spinal stenosis in cervical region 11/27/2012   Hx of colonoscopy with polypectomy 12/21/2011   Impaired fasting glucose 08/29/2011    Allergies  Allergen Reactions   Sulfa Antibiotics Other (See Comments)   Sulfamethoxazole-Trimethoprim Other (See Comments)    Other reaction(s): Unknown Other reaction(s): Unknown    Sulfasalazine Other (See Comments)    Past Surgical History:  Procedure Laterality Date   CARDIAC SURGERY  03/2010   bypass   CERVICAL FUSION  12/11/2012   C4-5,C5-6,C6-7   COLONOSCOPY  2011 ?   COLONOSCOPY WITH PROPOFOL N/A 07/11/2017   Procedure: COLONOSCOPY WITH PROPOFOL;  Surgeon: Lin Landsman, MD;  Location: [place];  Service: Endoscopy;  Laterality: N/A;   POLYPECTOMY N/A 07/11/2017   Procedure: POLYPECTOMY;  Surgeon: Lin Landsman, MD;  Location: [place];  Service: Endoscopy;  Laterality: N/A;    Social History   Tobacco Use   Smoking status: Former    Packs/day: 1.50    Years: 30.00    Total pack years: 45.00    Types: Cigarettes    Quit date: 1997    Years since quitting: 26.6   Smokeless tobacco: Former    Types: Chew   Tobacco comments:    smoking cessation materials not required  Vaping Use   Vaping Use: Never used  Substance Use Topics   Alcohol use: No    Alcohol/week: 0.0 standard drinks of alcohol   Drug use: No     Medication list has been reviewed and updated.  Current Meds  Medication Sig   aspirin EC 81 MG tablet Take 1 tablet (81 mg total) by mouth daily.   atorvastatin (LIPITOR) 80 MG tablet TAKE 1  TABLET BY MOUTH DAILY   metoprolol tartrate (LOPRESSOR) 25 MG tablet TAKE ONE-HALF TABLET BY MOUTH  TWICE DAILY   pantoprazole (PROTONIX) 40 MG tablet TAKE 1 TABLET BY MOUTH DAILY       11/16/2021   10:18 AM 05/19/2021    9:40 AM 08/17/2020    9:02 AM 02/16/2020    9:15 AM  GAD 7 : Generalized Anxiety Score  Nervous, Anxious, on Edge 0 0 0 0  Control/stop worrying 0 0 0 0  Worry too much - different things 1 0 0 0  Trouble relaxing 0 0 0 0  Restless 0 0 0 0  Easily annoyed or irritable 1 0 0 0  Afraid - awful might happen 1 0 0 0  Total GAD 7 Score 3 0 0 0  Anxiety Difficulty Not difficult at all Not difficult at all         11/16/2021   10:18 AM 05/19/2021    9:40 AM 08/17/2020    9:02 AM  Depression screen PHQ 2/9  Decreased Interest 0 0 0  Down, Depressed, Hopeless 0 0 0  PHQ - 2 Score 0 0 0  Altered sleeping 1 0 0  Tired, decreased energy 1  0 0  Change in appetite 0 0 0  Feeling bad or failure about yourself  0 0 0  Trouble concentrating 0 0 0  Moving slowly or fidgety/restless 0 0 0  Suicidal thoughts 0 0 0  PHQ-9 Score 2 0 0  Difficult doing work/chores Not difficult at all Not difficult at all     BP Readings from Last 3 Encounters:  11/16/21 122/62  05/19/21 130/60  08/17/20 136/82    Physical Exam Vitals and nursing note reviewed.  HENT:     Head: Normocephalic.     Right Ear: External ear normal.     Left Ear: External ear normal.     Nose: Nose normal.  Eyes:     General: No scleral icterus.       Right eye: No discharge.        Left eye: No discharge.     Conjunctiva/sclera: Conjunctivae normal.     Pupils: Pupils are equal, round, and reactive to light.  Neck:     Thyroid: No thyromegaly.     Vascular: No JVD.     Trachea: No tracheal deviation.  Cardiovascular:     Rate and Rhythm: Normal rate and regular rhythm.     Heart sounds: Normal heart sounds. No murmur heard.    No friction rub. No gallop.  Pulmonary:     Effort: No respiratory  distress.     Breath sounds: Normal breath sounds. No wheezing or rales.  Abdominal:     General: Bowel sounds are normal.     Palpations: Abdomen is soft. There is no mass.     Tenderness: There is no abdominal tenderness. There is no guarding or rebound.  Musculoskeletal:        General: No tenderness. Normal range of motion.     Cervical back: Normal range of motion and neck supple.  Lymphadenopathy:     Cervical: No cervical adenopathy.  Skin:    General: Skin is warm.     Findings: No rash.  Neurological:     Mental Status: He is alert and oriented to person, place, and time.     Cranial Nerves: No cranial nerve deficit.     Deep Tendon Reflexes: Reflexes are normal and symmetric.     Wt Readings from Last 3 Encounters:  11/16/21 190 lb (86.2 kg)  05/19/21 194 lb (88 kg)  08/17/20 195 lb (88.5 kg)    BP 122/62   Pulse (!) 56   Ht '5\' 6"'  (1.676 m)   Wt 190 lb (86.2 kg)   BMI 30.67 kg/m   Assessment and Plan:  1. Coronary artery disease involving autologous vein coronary bypass graft without angina pectoris Chronic.  Controlled.  Stable.  Patient's had no anginal episodes in the past 6 months.  Continue atorvastatin 80 mg once a day and metoprolol 25 mg 1/2 tablet twice a day. - atorvastatin (LIPITOR) 80 MG tablet; Take 1 tablet (80 mg total) by mouth daily.  Dispense: 90 tablet; Refill: 1 - metoprolol tartrate (LOPRESSOR) 25 MG tablet; Take 0.5 tablets (12.5 mg total) by mouth 2 (two) times daily.  Dispense: 90 tablet; Refill: 1  2. Mild hyperlipidemia Chronic.  Controlled.  Stable.  Review of previous lipids is acceptable continue atorvastatin 80 mg once a day. - atorvastatin (LIPITOR) 80 MG tablet; Take 1 tablet (80 mg total) by mouth daily.  Dispense: 90 tablet; Refill: 1  3. Essential hypertension Chronic.  Controlled.  Stable.  Blood pressure today 122/80.  Patient is asymptomatic.  Tolerating medication well.  We will continue to metoprolol 0.5 mg twice a  day. - metoprolol tartrate (LOPRESSOR) 25 MG tablet; Take 0.5 tablets (12.5 mg total) by mouth 2 (two) times daily.  Dispense: 90 tablet; Refill: 1  4. Gastroesophageal reflux disease, unspecified whether esophagitis present Chronic.  Controlled.  Stable.  No reflux breakthrough.  No dysphagia noted during history.  Continue pantoprazole 40 mg once a day.  We will recheck in 6 months.  - pantoprazole (PROTONIX) 40 MG tablet; Take 1 tablet (40 mg total) by mouth daily.  Dispense: 90 tablet; Refill: 1    Otilio Miu, MD

## 2021-11-21 ENCOUNTER — Telehealth: Payer: Self-pay | Admitting: Family Medicine

## 2021-11-21 NOTE — Telephone Encounter (Signed)
Copied from [place] [phone] [phone]. Topic: Medicare AWV >> Nov 21, 2021 11:08 AM Jae Dire wrote: Reason for CRM:  Left message for patient to call back and schedule Medicare Annual Wellness Visit (AWV) in office.   If unable to come into the office for AWV,  please offer to do virtually or by telephone.  Last AWV: 06/21/2020  Please schedule at anytime with [facility] Health Advisor.      30 minute appointment for Virtual or phone 45 minute appointment for in office or Initial virtual/phone  Any questions, please call me at [phone]

## 2022-02-01 ENCOUNTER — Ambulatory Visit (INDEPENDENT_AMBULATORY_CARE_PROVIDER_SITE_OTHER): Payer: Medicare Other

## 2022-02-01 VITALS — Ht 66.0 in | Wt 190.0 lb

## 2022-02-01 DIAGNOSIS — Z0000 Encounter for general adult medical examination without abnormal findings: Secondary | ICD-10-CM | POA: Diagnosis not present

## 2022-02-01 NOTE — Patient Instructions (Signed)
Health Maintenance, Male Adopting a healthy lifestyle and getting preventive care are important in promoting health and wellness. Ask your health care provider about: The right schedule for you to have regular tests and exams. Things you can do on your own to prevent diseases and keep yourself healthy. What should I know about diet, weight, and exercise? Eat a healthy diet  Eat a diet that includes plenty of vegetables, fruits, low-fat dairy products, and lean protein. Do not eat a lot of foods that are high in solid fats, added sugars, or sodium. Maintain a healthy weight Body mass index (BMI) is a measurement that can be used to identify possible weight problems. It estimates body fat based on height and weight. Your health care provider can help determine your BMI and help you achieve or maintain a healthy weight. Get regular exercise Get regular exercise. This is one of the most important things you can do for your health. Most adults should: Exercise for at least 150 minutes each week. The exercise should increase your heart rate and make you sweat (moderate-intensity exercise). Do strengthening exercises at least twice a week. This is in addition to the moderate-intensity exercise. Spend less time sitting. Even light physical activity can be beneficial. Watch cholesterol and blood lipids Have your blood tested for lipids and cholesterol at 75 years of age, then have this test every 5 years. You may need to have your cholesterol levels checked more often if: Your lipid or cholesterol levels are high. You are older than 75 years of age. You are at high risk for heart disease. What should I know about cancer screening? Many types of cancers can be detected early and may often be prevented. Depending on your health history and family history, you may need to have cancer screening at various ages. This may include screening for: Colorectal cancer. Prostate cancer. Skin cancer. Lung  cancer. What should I know about heart disease, diabetes, and high blood pressure? Blood pressure and heart disease High blood pressure causes heart disease and increases the risk of stroke. This is more likely to develop in people who have high blood pressure readings or are overweight. Talk with your health care provider about your target blood pressure readings. Have your blood pressure checked: Every 3-5 years if you are 18-39 years of age. Every year if you are 40 years old or older. If you are between the ages of 65 and 75 and are a current or former smoker, ask your health care provider if you should have a one-time screening for abdominal aortic aneurysm (AAA). Diabetes Have regular diabetes screenings. This checks your fasting blood sugar level. Have the screening done: Once every three years after age 45 if you are at a normal weight and have a low risk for diabetes. More often and at a younger age if you are overweight or have a high risk for diabetes. What should I know about preventing infection? Hepatitis B If you have a higher risk for hepatitis B, you should be screened for this virus. Talk with your health care provider to find out if you are at risk for hepatitis B infection. Hepatitis C Blood testing is recommended for: Everyone born from 1945 through 1965. Anyone with known risk factors for hepatitis C. Sexually transmitted infections (STIs) You should be screened each year for STIs, including gonorrhea and chlamydia, if: You are sexually active and are younger than 75 years of age. You are older than 75 years of age and your   health care provider tells you that you are at risk for this type of infection. Your sexual activity has changed since you were last screened, and you are at increased risk for chlamydia or gonorrhea. Ask your health care provider if you are at risk. Ask your health care provider about whether you are at high risk for HIV. Your health care provider  may recommend a prescription medicine to help prevent HIV infection. If you choose to take medicine to prevent HIV, you should first get tested for HIV. You should then be tested every 3 months for as long as you are taking the medicine. Follow these instructions at home: Alcohol use Do not drink alcohol if your health care provider tells you not to drink. If you drink alcohol: Limit how much you have to 0-2 drinks a day. Know how much alcohol is in your drink. In the [place] one drink equals one 12 oz bottle of beer (355 mL), one 5 oz glass of wine (148 mL), or one 1 oz glass of hard liquor (44 mL). Lifestyle Do not use any products that contain nicotine or tobacco. These products include cigarettes, chewing tobacco, and vaping devices, such as e-cigarettes. If you need help quitting, ask your health care provider. Do not use street drugs. Do not share needles. Ask your health care provider for help if you need support or information about quitting drugs. General instructions Schedule regular health, dental, and eye exams. Stay current with your vaccines. Tell your health care provider if: You often feel depressed. You have ever been abused or do not feel safe at home. Summary Adopting a healthy lifestyle and getting preventive care are important in promoting health and wellness. Follow your health care provider's instructions about healthy diet, exercising, and getting tested or screened for diseases. Follow your health care provider's instructions on monitoring your cholesterol and blood pressure. This information is not intended to replace advice given to you by your health care provider. Make sure you discuss any questions you have with your health care provider. Document Revised: 08/09/2020 Document Reviewed: 08/09/2020 Elsevier Patient Education  [place] you for enrolling in [place]. Please follow the instructions below to securely access your online medical record.  MyChart allows you to send messages to your doctor, view your test results, manage appointments, and more.   How Do I Sign Up? In your Internet browser, go to [employer] and enter [email or web address]. Click on the Sign Up Now link in the Sign In box. You will see the New Member Sign Up page. Enter your MyChart Access Code exactly as it appears below. You will not need to use this code after you've completed the sign-up process. If you do not sign up before the expiration date, you must request a new code.  MyChart Access Code: [email or web address] Expires: 03/18/2022  8:08 AM  Enter your Social Security Number ([id number]) and Date of Birth (mm/dd/yyyy) as indicated and click Submit. You will be taken to the next sign-up page. Create a MyChart ID. This will be your MyChart login ID and cannot be changed, so think of one that is secure and easy to remember. Create a Scientist, research (physical sciences). You can change your password at any time. Enter your Password Reset Question and Answer. This can be used at a later time if you forget your password.  Enter your e-mail address. You will receive e-mail notification when new information is available in [place]. Click Sign Up. You can now view your  medical record.   Additional Information Remember, MyChart is NOT to be used for urgent needs. For medical emergencies, dial 911.

## 2022-02-01 NOTE — Progress Notes (Signed)
Subjective:   Matthew Humphrey is a 76 y.o. male who presents for Medicare Annual/Subsequent preventive examination.  I connected with  Matthew Humphrey on 02/01/22 by a audio enabled telemedicine application and verified that I am speaking with the correct person using two identifiers.  Patient Location: Home  Provider Location: Office/Clinic  I discussed the limitations of evaluation and management by telemedicine. The patient expressed understanding and agreed to proceed.    Review of Systems    Defer to PCP Cardiac Risk Factors include: obesity (BMI >30kg/m2);male gender     Objective:    Today's Vitals   02/01/22 0807  Weight: 190 lb (86.2 kg)  Height: '5\' 6"'$  (1.676 m)   Body mass index is 30.67 kg/m.     02/01/2022    8:33 AM 06/21/2020    9:15 AM 06/18/2019    8:50 AM 06/12/2018    8:19 AM 07/11/2017    8:21 AM 06/11/2017    2:32 PM 05/18/2017    1:26 PM  Advanced Directives  Does Patient Have a Medical Advance Directive? No No No No No No No  Would patient like information on creating a medical advance directive? No - Patient declined No - Patient declined Yes (MAU/Ambulatory/Procedural Areas - Information given) No - Patient declined No - Patient declined Yes (MAU/Ambulatory/Procedural Areas - Information given)     Current Medications (verified) Outpatient Encounter Medications as of 02/01/2022  Medication Sig   aspirin EC 81 MG tablet Take 1 tablet (81 mg total) by mouth daily.   atorvastatin (LIPITOR) 80 MG tablet Take 1 tablet (80 mg total) by mouth daily.   metoprolol tartrate (LOPRESSOR) 25 MG tablet Take 0.5 tablets (12.5 mg total) by mouth 2 (two) times daily.   pantoprazole (PROTONIX) 40 MG tablet Take 1 tablet (40 mg total) by mouth daily.   No facility-administered encounter medications on file as of 02/01/2022.    Allergies (verified) Sulfa antibiotics, Sulfamethoxazole-trimethoprim, and Sulfasalazine   History: Past Medical History:   Diagnosis Date   Arthritis    knee   Chronic kidney disease    GERD (gastroesophageal reflux disease)    Hyperlipidemia    Hypertension    Past Surgical History:  Procedure Laterality Date   CARDIAC SURGERY  03/2010   bypass   CERVICAL FUSION  12/11/2012   C4-5,C5-6,C6-7   COLONOSCOPY  2011 ?   COLONOSCOPY WITH PROPOFOL N/A 07/11/2017   Procedure: COLONOSCOPY WITH PROPOFOL;  Surgeon: Lin Landsman, MD;  Location: [facility];  Service: Endoscopy;  Laterality: N/A;   POLYPECTOMY N/A 07/11/2017   Procedure: POLYPECTOMY;  Surgeon: Lin Landsman, MD;  Location: [facility];  Service: Endoscopy;  Laterality: N/A;   Family History  Problem Relation Age of Onset   Hypertension Father    Hypotension Mother    Social History   Socioeconomic History   Marital status: Single    Spouse name: Not on file   Number of children: 3   Years of education: Not on file   Highest education level: 9th grade  Occupational History   Occupation: Retired  Tobacco Use   Smoking status: Former    Packs/day: 1.50    Years: 30.00    Total pack years: 45.00    Types: Cigarettes    Quit date: 1997    Years since quitting: 26.8   Smokeless tobacco: Former    Types: Chew   Tobacco comments:    smoking cessation materials not required  [employer] [facility]  Use   Vaping Use: Never used  Substance and Sexual Activity   Alcohol use: No    Alcohol/week: 0.0 standard drinks of alcohol   Drug use: No   Sexual activity: Not Currently  Other Topics Concern   Not on file  Social History Narrative   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, lives alone   Social Determinants of Health   Financial Resource Strain: [place]  (02/01/2022)   Overall Financial Resource Strain (CARDIA)    Difficulty of Paying Living Expenses: Not hard at all  Food Insecurity: No Food Insecurity (02/01/2022)   Hunger Vital Sign    Worried About Running Out of Food in the Last Year: Never true    [place] in the Last Year: Never true  Transportation Needs: No Transportation Needs (02/01/2022)   PRAPARE - Hydrologist (Medical): No    Lack of Transportation (Non-Medical): No  Physical Activity: Inactive (06/21/2020)   Exercise Vital Sign    Days of Exercise per Week: 0 days    Minutes of Exercise per Session: 0 min  Stress: No Stress Concern Present (02/01/2022)   [place]    Feeling of Stress : Not at all  Social Connections: Socially Isolated (02/01/2022)   Social Connection and Isolation Panel [NHANES]    Frequency of Communication with Friends and Family: Twice a week    Frequency of Social Gatherings with Friends and Family: Once a week    Attends Religious Services: Never    Marine scientist or Organizations: No    Attends Music therapist: Never    Marital Status: Separated    Tobacco Counseling Counseling given: Not Answered Tobacco comments: smoking cessation materials not required   Clinical Intake:  Pre-visit preparation completed: Yes  Pain : No/denies pain     BMI - recorded: 30.67 Nutritional Status: BMI > 30  Obese Nutritional Risks: None Diabetes: No     Diabetic? No.     Information entered by :: Matthew Humphrey, [place] of Daily Living    02/01/2022    8:34 AM  In your present state of health, do you have any difficulty performing the following activities:  Hearing? 0  Vision? 0  Difficulty concentrating or making decisions? 0  Walking or climbing stairs? 0  Dressing or bathing? 0  Doing errands, shopping? 0  Preparing Food and eating ? N  Using the Toilet? N  In the past six months, have you accidently leaked urine? N  Do you have problems with loss of bowel control? N  Managing your Medications? N  Managing your Finances? N  Housekeeping or managing your Housekeeping? N    Patient Care Team: Matthew Patch, MD as PCP - General (Family Medicine)  Indicate any recent Medical Services you may have received from other than [facility] providers in the past year (date may be approximate).     Assessment:   This is a routine wellness examination for [name].  Hearing/Vision screen Hearing Screening - Comments:: No concerns. Vision Screening - Comments:: Wears glasses.  Dietary issues and exercise activities discussed: Current Exercise Habits: The patient does not participate in regular exercise at present   Goals Addressed             This Visit's Progress    DIET - [place]   On track  Recommend to drink at least 6-8 8oz glasses of water per day.      Depression Screen    02/01/2022    8:33 AM 11/16/2021   10:18 AM 05/19/2021    9:40 AM 08/17/2020    9:02 AM 06/21/2020    9:15 AM 02/16/2020    9:15 AM 09/11/2019    9:03 AM  PHQ 2/9 Scores  PHQ - 2 Score 0 0 0 0 0 0 0  PHQ- 9 Score 0 2 0 0  0 0    Fall Risk    02/01/2022    8:34 AM 11/16/2021   10:17 AM 06/21/2020    9:17 AM 02/16/2020    9:15 AM 09/11/2019    9:03 AM  Fall Risk   Falls in the past year? 0 0 0 0 0  Number falls in past yr: 0 0 0    Injury with Fall? 0 0 0    Risk for fall due to : No Fall Risks No Fall Risks No Fall Risks    Follow up Falls evaluation completed Falls evaluation completed Falls prevention discussed Falls evaluation completed Falls evaluation completed    [place]:  Any stairs in or around the home? Yes  If so, are there any without handrails? Yes  Home free of loose throw rugs in walkways, pet beds, electrical cords, etc? Yes  Adequate lighting in your home to reduce risk of falls? Yes   ASSISTIVE DEVICES UTILIZED TO PREVENT FALLS:  Life alert? Yes  Use of a cane, walker or w/c? No  Grab bars in the bathroom? No  Shower chair or bench in shower? No  Elevated toilet seat or a handicapped toilet? No   Cognitive Function:         02/01/2022    8:35 AM 06/21/2020    9:18 AM 06/18/2019    8:52 AM 06/12/2018    8:21 AM 06/11/2017    2:36 PM  6CIT Screen  What Year? 0 points 0 points 0 points 0 points 0 points  What month? 0 points 0 points 0 points 0 points 0 points  What time? 0 points 0 points 0 points 0 points 0 points  Count back from 20 0 points 0 points 0 points 0 points 0 points  Months in reverse 0 points 0 points 0 points 2 points 0 points  Repeat phrase 0 points 4 points 2 points 2 points 2 points  Total Score 0 points 4 points 2 points 4 points 2 points    Immunizations Immunization History  Administered Date(s) Administered   Fluad Quad(high Dose 65+) 03/05/2019, 02/16/2020   Influenza, High Dose Seasonal PF 12/12/2016, 01/29/2018   Influenza,inj,Quad PF,6+ Mos 12/16/2014, 12/15/2015   Influenza-Unspecified 12/22/2011, 01/15/2013   PFIZER(Purple Top)SARS-COV-2 Vaccination 06/16/2019, 07/13/2019, 05/04/2020   Pneumococcal Conjugate-13 08/22/2016   Pneumococcal Polysaccharide-23 12/22/2011, 12/16/2014   Tdap 06/22/2017   Zoster Recombinat (Shingrix) 03/06/2018, 05/13/2018    TDAP status: Up to date  Flu Vaccine status: Due, Education has been provided regarding the importance of this vaccine. Advised may receive this vaccine at local pharmacy or Health Dept. Aware to provide a copy of the vaccination record if obtained from local pharmacy or Health Dept. Verbalized acceptance and understanding.  Pneumococcal vaccine status: Up to date  Covid-19 vaccine status: Completed vaccines  Qualifies for Shingles Vaccine? Yes   Zostavax completed Yes   Shingrix Completed?: Yes  Screening Tests Health Maintenance  Topic Date Due  COVID-19 Vaccine (4 - Pfizer series) 06/29/2020   INFLUENZA VACCINE  11/01/2021   COLONOSCOPY (Pts 45-49yr Insurance coverage will need to be confirmed)  07/12/2022   Medicare Annual Wellness (AWV)  02/02/2023   TETANUS/TDAP  06/23/2027   Pneumonia Vaccine 75 Years old   Completed   Hepatitis C Screening  Completed   Zoster Vaccines- Shingrix  Completed   HPV VACCINES  Aged Out    Health Maintenance  Health Maintenance Due  Topic Date Due   COVID-19 Vaccine (4 - Pfizer series) 06/29/2020   INFLUENZA VACCINE  11/01/2021    Colorectal cancer screening: Type of screening: Colonoscopy. Completed 07/11/2017. Repeat every 5 years  Lung Cancer Screening: (Low Dose CT Chest recommended if Age 75-80years, 30 pack-year currently smoking OR have quit w/in 15years.) does not qualify.   Additional Screening:  Hepatitis C Screening: Completed 03/13/2019.  Vision Screening: Recommended annual ophthalmology exams for early detection of glaucoma and other disorders of the eye. Is the patient up to date with their annual eye exam?  Yes  Who is the provider or what is the name of the office in which the patient attends annual eye exams? [facility]: Recommended annual dental exams for proper oral hygiene  Community Resource Referral / Chronic Care Management: CRR required this visit?  No   CCM required this visit?  No      Plan:     I have personally reviewed and noted the following in the patient's chart:   Medical and social history Use of alcohol, tobacco or illicit drugs  Current medications and supplements including opioid prescriptions. Patient is not currently taking opioid prescriptions. Functional ability and status Nutritional status Physical activity Advanced directives List of other physicians Hospitalizations, surgeries, and ER visits in previous 12 months Vitals Screenings to include cognitive, depression, and falls Referrals and appointments  In addition, I have reviewed and discussed with patient certain preventive protocols, quality metrics, and best practice recommendations. A written personalized care plan for preventive services as well as general preventive health recommendations were provided  to patient.     CClista Bernhardt CFort Calhoun  02/01/2022    Mr. Dooner , Thank you for taking time to come for your Medicare Wellness Visit. I appreciate your ongoing commitment to your health goals. Please review the following plan we discussed and let me know if I can assist you in the future.   These are the goals we discussed:  Goals      DIET - INCREASE WATER INTAKE     Recommend to drink at least 6-8 8oz glasses of water per day.        This is a list of the screening recommended for you and due dates:  Health Maintenance  Topic Date Due   COVID-19 Vaccine (4 - Pfizer series) 06/29/2020   Flu Shot  11/01/2021   Colon Cancer Screening  07/12/2022   Medicare Annual Wellness Visit  02/02/2023   Tetanus Vaccine  06/23/2027   Pneumonia Vaccine  Completed   Hepatitis C Screening: USPSTF Recommendation to screen - Ages 18-79 yo.  Completed   Zoster (Shingles) Vaccine  Completed   HPV Vaccine  Aged Out      Nurse Notes: None.

## 2022-05-19 ENCOUNTER — Ambulatory Visit (INDEPENDENT_AMBULATORY_CARE_PROVIDER_SITE_OTHER): Payer: 59 | Admitting: Family Medicine

## 2022-05-19 ENCOUNTER — Encounter: Payer: Self-pay | Admitting: Family Medicine

## 2022-05-19 DIAGNOSIS — E785 Hyperlipidemia, unspecified: Secondary | ICD-10-CM | POA: Diagnosis not present

## 2022-05-19 DIAGNOSIS — K219 Gastro-esophageal reflux disease without esophagitis: Secondary | ICD-10-CM | POA: Diagnosis not present

## 2022-05-19 DIAGNOSIS — I10 Essential (primary) hypertension: Secondary | ICD-10-CM | POA: Diagnosis not present

## 2022-05-19 DIAGNOSIS — I25810 Atherosclerosis of coronary artery bypass graft(s) without angina pectoris: Secondary | ICD-10-CM

## 2022-05-19 MED ORDER — PANTOPRAZOLE SODIUM 40 MG PO TBEC: 40.0000 mg | DELAYED_RELEASE_TABLET | Freq: Every day | ORAL | 1 refills | Status: DC

## 2022-05-19 MED ORDER — METOPROLOL TARTRATE 25 MG PO TABS: 12.5000 mg | ORAL_TABLET | Freq: Two times a day (BID) | ORAL | 1 refills | Status: AC

## 2022-05-19 MED ORDER — ATORVASTATIN CALCIUM 80 MG PO TABS: 80.0000 mg | ORAL_TABLET | Freq: Every day | ORAL | 1 refills | Status: DC

## 2022-05-19 NOTE — Progress Notes (Signed)
Date:  05/19/2022   Name:  Matthew Humphrey   DOB:  01/17/1946   MRN:  [id number]   Chief Complaint: Hypertension, Hyperlipidemia, and Gastroesophageal Reflux  Hypertension This is a chronic problem. The current episode started more than 1 year ago. The problem has been gradually improving since onset. The problem is controlled. Pertinent negatives include no anxiety, blurred vision, chest pain, headaches, malaise/fatigue, neck pain, orthopnea, palpitations, peripheral edema, PND, shortness of breath or sweats. There are no associated agents to hypertension. There are no known risk factors for coronary artery disease. Past treatments include beta blockers. The current treatment provides moderate improvement. There are no compliance problems.  There is no history of angina, kidney disease, CAD/MI, CVA, heart failure, left ventricular hypertrophy, PVD or retinopathy. There is no history of chronic renal disease, a hypertension causing med or renovascular disease.  Hyperlipidemia This is a chronic problem. The current episode started more than 1 year ago. The problem is controlled. Recent lipid tests were reviewed and are normal. He has no history of chronic renal disease, diabetes, hypothyroidism, obesity or nephrotic syndrome. There are no known factors aggravating his hyperlipidemia. Pertinent negatives include no chest pain, focal sensory loss, focal weakness, leg pain, myalgias or shortness of breath. Current antihyperlipidemic treatment includes statins. The current treatment provides moderate improvement of lipids.  Gastroesophageal Reflux He reports no abdominal pain, no chest pain, no coughing, no heartburn, no nausea, no sore throat or no wheezing. This is a chronic problem. The problem has been gradually improving. The symptoms are aggravated by certain foods.    Lab Results  Component Value Date   NA 142 05/19/2021   K 4.4 05/19/2021   CO2 23 05/19/2021   GLUCOSE 100 (H) 05/19/2021    BUN 19 05/19/2021   CREATININE 1.38 (H) 05/19/2021   CALCIUM 9.5 05/19/2021   EGFR 54 (L) 05/19/2021   GFRNONAA 43 (L) 02/16/2020   Lab Results  Component Value Date   CHOL 139 05/19/2021   HDL 51 05/19/2021   LDLCALC 75 05/19/2021   TRIG 62 05/19/2021   CHOLHDL 3.9 03/12/2018   No results found for: "TSH" No results found for: "HGBA1C" Lab Results  Component Value Date   WBC 5.0 01/03/2013   HGB 14.4 01/03/2013   HCT 44.1 01/03/2013   MCV 90 01/03/2013   PLT 282 01/03/2013   Lab Results  Component Value Date   ALT 41 05/19/2021   AST 41 (H) 05/19/2021   ALKPHOS 101 05/19/2021   BILITOT 1.0 05/19/2021   No results found for: "25OHVITD2", "25OHVITD3", "VD25OH"   Review of Systems  Constitutional:  Negative for chills, fever and malaise/fatigue.  HENT:  Negative for drooling, ear discharge, ear pain and sore throat.   Eyes:  Negative for blurred vision.  Respiratory:  Negative for cough, shortness of breath and wheezing.   Cardiovascular:  Negative for chest pain, palpitations, orthopnea, leg swelling and PND.  Gastrointestinal:  Negative for abdominal pain, blood in stool, constipation, diarrhea, heartburn and nausea.  Endocrine: Negative for polydipsia.  Genitourinary:  Negative for dysuria, frequency, hematuria and urgency.  Musculoskeletal:  Negative for back pain, myalgias and neck pain.  Skin:  Negative for rash.  Allergic/Immunologic: Negative for environmental allergies.  Neurological:  Negative for dizziness, focal weakness and headaches.  Hematological:  Does not bruise/bleed easily.  Psychiatric/Behavioral:  Negative for suicidal ideas. The patient is not nervous/anxious.     Patient Active Problem List   Diagnosis Date Noted  Chronic kidney disease 06/12/2018   Mediastinitis 06/12/2018   MRSA infection 06/12/2018   Peptic ulcer 06/12/2018   Vertebral osteomyelitis ([place]) 06/12/2018   Coronary artery disease involving autologous vein coronary  bypass graft without angina pectoris 09/04/2017   Cervical disc disease 09/04/2017   Special screening for malignant neoplasms, colon    Obesity (BMI 30.0-34.9) 12/20/2016   Primary osteoarthritis of right knee 12/20/2016   Primary osteoarthritis of both knees 08/22/2016   Radicular leg pain 12/15/2015   Essential hypertension 12/16/2014   Hyperlipidemia 12/16/2014   Esophageal reflux 12/16/2014   Spinal stenosis in cervical region 11/27/2012   Hx of colonoscopy with polypectomy 12/21/2011   Impaired fasting glucose 08/29/2011    Allergies  Allergen Reactions   Sulfa Antibiotics Other (See Comments)   Sulfamethoxazole-Trimethoprim Other (See Comments)    Other reaction(s): Unknown Other reaction(s): Unknown    Sulfasalazine Other (See Comments)    Past Surgical History:  Procedure Laterality Date   CARDIAC SURGERY  03/2010   bypass   CERVICAL FUSION  12/11/2012   C4-5,C5-6,C6-7   COLONOSCOPY  2011 ?   COLONOSCOPY WITH PROPOFOL N/A 07/11/2017   Procedure: COLONOSCOPY WITH PROPOFOL;  Surgeon: Lin Landsman, MD;  Location: [facility];  Service: Endoscopy;  Laterality: N/A;   POLYPECTOMY N/A 07/11/2017   Procedure: POLYPECTOMY;  Surgeon: Lin Landsman, MD;  Location: [place];  Service: Endoscopy;  Laterality: N/A;    Social History   Tobacco Use   Smoking status: Former    Packs/day: 1.50    Years: 30.00    Total pack years: 45.00    Types: Cigarettes    Quit date: 1997    Years since quitting: 27.1   Smokeless tobacco: Former    Types: Chew   Tobacco comments:    smoking cessation materials not required  Vaping Use   Vaping Use: Never used  Substance Use Topics   Alcohol use: No    Alcohol/week: 0.0 standard drinks of alcohol   Drug use: No     Medication list has been reviewed and updated.  Current Meds  Medication Sig   aspirin EC 81 MG tablet Take 1 tablet (81 mg total) by mouth daily.   atorvastatin (LIPITOR) 80 MG  tablet Take 1 tablet (80 mg total) by mouth daily.   metoprolol tartrate (LOPRESSOR) 25 MG tablet Take 0.5 tablets (12.5 mg total) by mouth 2 (two) times daily.   pantoprazole (PROTONIX) 40 MG tablet Take 1 tablet (40 mg total) by mouth daily.       11/16/2021   10:18 AM 05/19/2021    9:40 AM 08/17/2020    9:02 AM 02/16/2020    9:15 AM  GAD 7 : Generalized Anxiety Score  Nervous, Anxious, on Edge 0 0 0 0  Control/stop worrying 0 0 0 0  Worry too much - different things 1 0 0 0  Trouble relaxing 0 0 0 0  Restless 0 0 0 0  Easily annoyed or irritable 1 0 0 0  Afraid - awful might happen 1 0 0 0  Total GAD 7 Score 3 0 0 0  Anxiety Difficulty Not difficult at all Not difficult at all         05/19/2022   10:03 AM 02/01/2022    8:33 AM 11/16/2021   10:18 AM  Depression screen PHQ 2/9  Decreased Interest 0 0 0  Down, Depressed, Hopeless 0 0 0  PHQ - 2 Score 0 0 0  Altered sleeping 0 0 1  Tired, decreased energy 0 0 1  Change in appetite 0 0 0  Feeling bad or failure about yourself  0 0 0  Trouble concentrating 0 0 0  Moving slowly or fidgety/restless 0 0 0  Suicidal thoughts 0 0 0  PHQ-9 Score 0 0 2  Difficult doing work/chores Not difficult at all Not difficult at all Not difficult at all    BP Readings from Last 3 Encounters:  05/19/22 (!) 144/84  11/16/21 122/62  05/19/21 130/60    Physical Exam Vitals and nursing note reviewed.  HENT:     Head: Normocephalic.     Right Ear: External ear normal.     Left Ear: External ear normal.     Nose: Nose normal.  Eyes:     General: No scleral icterus.       Right eye: No discharge.        Left eye: No discharge.     Conjunctiva/sclera: Conjunctivae normal.     Pupils: Pupils are equal, round, and reactive to light.  Neck:     Thyroid: No thyromegaly.     Vascular: No JVD.     Trachea: No tracheal deviation.  Cardiovascular:     Rate and Rhythm: Normal rate and regular rhythm.     Heart sounds: Normal heart sounds. No  murmur heard.    No friction rub. No gallop.  Pulmonary:     Effort: No respiratory distress.     Breath sounds: Normal breath sounds. No wheezing, rhonchi or rales.  Abdominal:     General: Bowel sounds are normal.     Palpations: Abdomen is soft. There is no mass.     Tenderness: There is no abdominal tenderness. There is no guarding or rebound.  Musculoskeletal:        General: No tenderness. Normal range of motion.     Cervical back: Normal range of motion and neck supple.  Lymphadenopathy:     Cervical: No cervical adenopathy.  Skin:    General: Skin is warm.     Findings: No rash.  Neurological:     Mental Status: He is alert.     Wt Readings from Last 3 Encounters:  05/19/22 190 lb (86.2 kg)  02/01/22 190 lb (86.2 kg)  11/16/21 190 lb (86.2 kg)    BP (!) 144/84 (BP Location: Right Arm, Cuff Size: Large)   Pulse 68   Ht 5' 6"$  (1.676 m)   Wt 190 lb (86.2 kg)   SpO2 96%   BMI 30.67 kg/m   Assessment and Plan:  1. Mild hyperlipidemia Chronic.  Controlled.  Stable.  Continue atorvastatin 80 mg once a day.  Will check lipid panel. - atorvastatin (LIPITOR) 80 MG tablet; Take 1 tablet (80 mg total) by mouth daily.  Dispense: 90 tablet; Refill: 1 - Lipid Panel With LDL/HDL Ratio  2. Coronary artery disease involving autologous vein coronary bypass graft without angina pectoris Chronic.  Controlled.  Stable.  Control of atherosclerosis with control of lipid management and hypertension control with medications and diet. - atorvastatin (LIPITOR) 80 MG tablet; Take 1 tablet (80 mg total) by mouth daily.  Dispense: 90 tablet; Refill: 1 - metoprolol tartrate (LOPRESSOR) 25 MG tablet; Take 0.5 tablets (12.5 mg total) by mouth 2 (two) times daily.  Dispense: 90 tablet; Refill: 1  3. Essential hypertension Chronic.  Controlled.  Stable.  Blood pressure elevated because patient has not been taking medication for at least a  month.  Current blood pressure is 144/84.  Patient is  asymptomatic we will resume Lopressor 25 mg 1/2 tablet twice a day and will recheck in 6 weeks.  We will check CMP for electrolytes and GFR. - metoprolol tartrate (LOPRESSOR) 25 MG tablet; Take 0.5 tablets (12.5 mg total) by mouth 2 (two) times daily.  Dispense: 90 tablet; Refill: 1 - Comprehensive Metabolic Panel (CMET)  4. Gastroesophageal reflux disease, unspecified whether esophagitis present Chronic.  Controlled.  Stable.  Continue pantoprazole 40 mg once a day. - pantoprazole (PROTONIX) 40 MG tablet; Take 1 tablet (40 mg total) by mouth daily.  Dispense: 90 tablet; Refill: 1    Otilio Miu, MD

## 2022-05-20 LAB — COMPREHENSIVE METABOLIC PANEL
ALT: 38 IU/L (ref 0–44)
AST: 38 IU/L (ref 0–40)
Albumin/Globulin Ratio: 1.4 (ref 1.2–2.2)
Albumin: 4.2 g/dL (ref 3.8–4.8)
Alkaline Phosphatase: 108 IU/L (ref 44–121)
BUN/Creatinine Ratio: 17 (ref 10–24)
BUN: 24 mg/dL (ref 8–27)
Bilirubin Total: 0.5 mg/dL (ref 0.0–1.2)
CO2: 22 mmol/L (ref 20–29)
Calcium: 9.8 mg/dL (ref 8.6–10.2)
Chloride: 106 mmol/L (ref 96–106)
Creatinine, Ser: 1.38 mg/dL — ABNORMAL HIGH (ref 0.76–1.27)
Globulin, Total: 3 g/dL (ref 1.5–4.5)
Glucose: 95 mg/dL (ref 70–99)
Potassium: 4.9 mmol/L (ref 3.5–5.2)
Sodium: 143 mmol/L (ref 134–144)
Total Protein: 7.2 g/dL (ref 6.0–8.5)
eGFR: 53 mL/min/{1.73_m2} — ABNORMAL LOW (ref >59)

## 2022-05-20 LAB — LIPID PANEL WITH LDL/HDL RATIO
Cholesterol, Total: 167 mg/dL (ref 100–199)
HDL: 49 mg/dL (ref >39)
LDL Chol Calc (NIH): 104 mg/dL — ABNORMAL HIGH (ref 0–99)
LDL/HDL Ratio: 2.1 ratio (ref 0.0–3.6)
Triglycerides: 75 mg/dL (ref 0–149)
VLDL Cholesterol Cal: 14 mg/dL (ref 5–40)

## 2022-07-27 ENCOUNTER — Other Ambulatory Visit: Payer: Self-pay | Admitting: Family Medicine

## 2022-07-27 DIAGNOSIS — I10 Essential (primary) hypertension: Secondary | ICD-10-CM

## 2022-07-27 DIAGNOSIS — I25810 Atherosclerosis of coronary artery bypass graft(s) without angina pectoris: Secondary | ICD-10-CM

## 2022-07-27 NOTE — Telephone Encounter (Signed)
Requested Prescriptions  Refused Prescriptions Disp Refills   metoprolol tartrate (LOPRESSOR) 25 MG tablet [Pharmacy Med Name: Metoprolol Tartrate 25 MG Oral Tablet] 100 tablet 2    Sig: TAKE ONE-HALF TABLET BY MOUTH  TWICE DAILY     Cardiovascular:  Beta Blockers Failed - 07/27/2022  5:46 AM      Failed - Last BP in normal range    BP Readings from Last 1 Encounters:  05/19/22 (!) 144/84         Passed - Last Heart Rate in normal range    Pulse Readings from Last 1 Encounters:  05/19/22 68         Passed - Valid encounter within last 6 months    Recent Outpatient Visits           2 months ago Mild hyperlipidemia   [facility]   8 months ago Coronary artery disease involving autologous vein coronary bypass graft without angina pectoris   [facility]   1 year ago Essential hypertension   [facility] [facility]   1 year ago Essential hypertension   [place] [facility]   2 years ago Essential hypertension   [place] [facility]       Future Appointments             In 3 months Duanne Limerick, MD [facility] at [facility], [facility]

## 2022-09-28 ENCOUNTER — Other Ambulatory Visit: Payer: Self-pay | Admitting: Family Medicine

## 2022-09-28 DIAGNOSIS — K219 Gastro-esophageal reflux disease without esophagitis: Secondary | ICD-10-CM

## 2022-09-28 DIAGNOSIS — E785 Hyperlipidemia, unspecified: Secondary | ICD-10-CM

## 2022-09-28 DIAGNOSIS — I25810 Atherosclerosis of coronary artery bypass graft(s) without angina pectoris: Secondary | ICD-10-CM

## 2022-11-20 ENCOUNTER — Telehealth: Payer: Self-pay | Admitting: Family Medicine

## 2022-11-20 ENCOUNTER — Ambulatory Visit: Payer: 59 | Admitting: Family Medicine

## 2022-11-20 NOTE — Telephone Encounter (Signed)
Tried reaching out to Patient to reschedule appt from today. Unable to reach patient. Unable to leave voicemail.

## 2022-11-27 ENCOUNTER — Other Ambulatory Visit: Payer: Self-pay | Admitting: Family Medicine

## 2022-11-27 DIAGNOSIS — K219 Gastro-esophageal reflux disease without esophagitis: Secondary | ICD-10-CM

## 2022-12-01 ENCOUNTER — Ambulatory Visit: Payer: 59 | Admitting: Family Medicine

## 2023-01-30 ENCOUNTER — Other Ambulatory Visit: Payer: Self-pay | Admitting: Family Medicine

## 2023-01-30 DIAGNOSIS — K219 Gastro-esophageal reflux disease without esophagitis: Secondary | ICD-10-CM

## 2023-02-20 ENCOUNTER — Other Ambulatory Visit: Payer: Self-pay | Admitting: Family Medicine

## 2023-02-20 DIAGNOSIS — I25810 Atherosclerosis of coronary artery bypass graft(s) without angina pectoris: Secondary | ICD-10-CM

## 2023-02-20 DIAGNOSIS — E785 Hyperlipidemia, unspecified: Secondary | ICD-10-CM

## 2023-04-05 ENCOUNTER — Telehealth: Payer: Self-pay | Admitting: Family Medicine

## 2023-04-05 NOTE — Telephone Encounter (Signed)
 Copied from CRM [id number]. Topic: Medicare AWV >> Apr 05, 2023  2:39 PM Nathanel DEL wrote: Reason for CRM: Called 04/05/2023 to sched AWV - NO VOICEMAIL  Nathanel Paschal; Care Guide Ambulatory Clinical Support [place] Direct Dial: [phone]

## 2023-04-18 ENCOUNTER — Ambulatory Visit: Payer: Self-pay | Admitting: *Deleted

## 2023-04-18 NOTE — Telephone Encounter (Signed)
 Reason for Disposition  [1] MILD longstanding difficulty breathing AND [2]  SAME as normal  Answer Assessment - Initial Assessment Questions 1. RESPIRATORY STATUS: "Describe your breathing?" (e.g., wheezing, shortness of breath, unable to speak, severe coughing)      I've been having trouble breathing for a while.   I want an appt to see what is going on.    2. ONSET: "When did this breathing problem begin?"      I'm not sure maybe a month or so.     3. PATTERN "Does the difficult breathing come and go, or has it been constant since it started?"      Most time I'm short of breath when I'm up doing something.   No recent illnesses 4. SEVERITY: "How bad is your breathing?" (e.g., mild, moderate, severe)    - MILD: No SOB at rest, mild SOB with walking, speaks normally in sentences, can lie down, no retractions, pulse < 100.    - MODERATE: SOB at rest, SOB with minimal exertion and prefers to sit, cannot lie down flat, speaks in phrases, mild retractions, audible wheezing, pulse 100-120.    - SEVERE: Very SOB at rest, speaks in single words, struggling to breathe, sitting hunched forward, retractions, pulse > 120      Mild 5. RECURRENT SYMPTOM: "Have you had difficulty breathing before?" If Yes, ask: "When was the last time?" and "What happened that time?"      No 6. CARDIAC HISTORY: "Do you have any history of heart disease?" (e.g., heart attack, angina, bypass surgery, angioplasty)      I had a heart operation bypass in 2011.   I'm having tightness in my chest every once in a while too. 7. LUNG HISTORY: "Do you have any history of lung disease?"  (e.g., pulmonary embolus, asthma, emphysema)     No 8. CAUSE: "What do you think is causing the breathing problem?"      I don't know that's why I want an appt. 9. OTHER SYMPTOMS: "Do you have any other symptoms? (e.g., dizziness, runny nose, cough, chest pain, fever)     I'm tired. 10. O2 SATURATION MONITOR:  "Do you use an oxygen saturation  monitor (pulse oximeter) at home?" If Yes, ask: "What is your reading (oxygen level) today?" "What is your usual oxygen saturation reading?" (e.g., 95%)       Not asked 11. PREGNANCY: "Is there any chance you are pregnant?" "When was your last menstrual period?"       N/A 12. TRAVEL: "Have you traveled out of the country in the last month?" (e.g., travel history, exposures)       Not asked  Protocols used: Breathing Difficulty-A-AH

## 2023-04-18 NOTE — Telephone Encounter (Signed)
  Chief Complaint: Intermittently shortness of breath and chest tightness for about a month or so mostly if up and moving around. Symptoms: above   Had heart bypass in 2011.   Frequency: Intermittently.   No chest tightness or shortness of breath now but wanting to get it checked out. Pertinent Negatives: Patient denies having chest tightness now.   Denies recent URI illnesses. Disposition: [] ED /[] Urgent Care (no appt availability in office) / [x] Appointment(In office/virtual)/ []  [place] [facility]/ [] Home Care/ [] Refused Recommended Disposition /[]  Mobile Bus/ []  Follow-up with PCP Additional Notes: Appt made with Dr. Rochelle Chu for 04/27/2023 at 1:40.    Went over signs/symptoms to go to the ED in care advice notes.

## 2023-04-25 ENCOUNTER — Other Ambulatory Visit: Payer: Self-pay | Admitting: Family Medicine

## 2023-04-25 DIAGNOSIS — I25810 Atherosclerosis of coronary artery bypass graft(s) without angina pectoris: Secondary | ICD-10-CM

## 2023-04-25 DIAGNOSIS — E785 Hyperlipidemia, unspecified: Secondary | ICD-10-CM

## 2023-04-27 ENCOUNTER — Encounter: Payer: Self-pay | Admitting: Family Medicine

## 2023-04-27 ENCOUNTER — Ambulatory Visit (INDEPENDENT_AMBULATORY_CARE_PROVIDER_SITE_OTHER): Payer: 59 | Admitting: Family Medicine

## 2023-04-27 VITALS — BP 136/60 | HR 91 | Ht 66.0 in | Wt 182.0 lb

## 2023-04-27 DIAGNOSIS — I10 Essential (primary) hypertension: Secondary | ICD-10-CM | POA: Diagnosis not present

## 2023-04-27 DIAGNOSIS — R071 Chest pain on breathing: Secondary | ICD-10-CM

## 2023-04-27 MED ORDER — NITROGLYCERIN 0.4 MG SL SUBL
0.4000 mg | SUBLINGUAL_TABLET | SUBLINGUAL | 3 refills | Status: AC | PRN
Start: 2023-04-27 — End: ?

## 2023-04-27 NOTE — Progress Notes (Signed)
Date:  04/27/2023   Name:  Matthew Humphrey   DOB:  Aug 04, 1945   MRN:  [id number]   Chief Complaint: Chest Pain (Chest pain with SOB intermittently per patient. Started a month ago. )  Chest Pain  This is a recurrent (duration 15- 20 minutes) problem. The current episode started more than 1 month ago. The onset quality is sudden. The problem occurs intermittently. The pain is present in the substernal region. The pain is at a severity of 7/10. The pain is moderate. The quality of the pain is described as tightness. The pain does not radiate. Associated symptoms include exertional chest pressure, leg pain and shortness of breath. Pertinent negatives include no abdominal pain, back pain, cough, diaphoresis, dizziness, fever, headaches, hemoptysis, irregular heartbeat, lower extremity edema, malaise/fatigue, nausea, near-syncope, numbness, orthopnea, palpitations, PND, sputum production, syncope, vomiting or weakness. The pain is aggravated by exertion and walking. He has tried nothing for the symptoms. The treatment provided mild relief. There are no known risk factors. Prior workup: ekg.    Lab Results  Component Value Date   NA 143 05/19/2022   K 4.9 05/19/2022   CO2 22 05/19/2022   GLUCOSE 95 05/19/2022   BUN 24 05/19/2022   CREATININE 1.38 (H) 05/19/2022   CALCIUM 9.8 05/19/2022   EGFR 53 (L) 05/19/2022   GFRNONAA 43 (L) 02/16/2020   Lab Results  Component Value Date   CHOL 167 05/19/2022   HDL 49 05/19/2022   LDLCALC 104 (H) 05/19/2022   TRIG 75 05/19/2022   CHOLHDL 3.9 03/12/2018   No results found for: "TSH" No results found for: "HGBA1C" Lab Results  Component Value Date   WBC 5.0 01/03/2013   HGB 14.4 01/03/2013   HCT 44.1 01/03/2013   MCV 90 01/03/2013   PLT 282 01/03/2013   Lab Results  Component Value Date   ALT 38 05/19/2022   AST 38 05/19/2022   ALKPHOS 108 05/19/2022   BILITOT 0.5 05/19/2022   No results found for: "25OHVITD2", "25OHVITD3", "VD25OH"    Review of Systems  Constitutional:  Negative for diaphoresis, fever, malaise/fatigue and unexpected weight change.  Eyes:  Negative for visual disturbance.  Respiratory:  Positive for chest tightness and shortness of breath. Negative for apnea, cough, hemoptysis, sputum production, choking, wheezing and stridor.   Cardiovascular:  Positive for chest pain. Negative for palpitations, orthopnea, leg swelling, syncope, PND and near-syncope.  Gastrointestinal:  Negative for abdominal distention, abdominal pain, blood in stool, nausea and vomiting.  Genitourinary:  Negative for difficulty urinating.  Musculoskeletal:  Negative for back pain.  Neurological:  Negative for dizziness, weakness, numbness and headaches.    Patient Active Problem List   Diagnosis Date Noted   Chronic kidney disease 06/12/2018   Mediastinitis 06/12/2018   MRSA infection 06/12/2018   Peptic ulcer 06/12/2018   Vertebral osteomyelitis (HCC) 06/12/2018   Coronary artery disease involving autologous vein coronary bypass graft without angina pectoris 09/04/2017   Cervical disc disease 09/04/2017   Special screening for malignant neoplasms, colon    Obesity (BMI 30.0-34.9) 12/20/2016   Primary osteoarthritis of right knee 12/20/2016   Primary osteoarthritis of both knees 08/22/2016   Radicular leg pain 12/15/2015   Essential hypertension 12/16/2014   Hyperlipidemia 12/16/2014   Esophageal reflux 12/16/2014   Spinal stenosis in cervical region 11/27/2012   Hx of colonoscopy with polypectomy 12/21/2011   Impaired fasting glucose 08/29/2011    Allergies  Allergen Reactions   Sulfa Antibiotics Other (See Comments)  Sulfamethoxazole-Trimethoprim Other (See Comments)    Other reaction(s): Unknown Other reaction(s): Unknown    Sulfasalazine Other (See Comments)    Past Surgical History:  Procedure Laterality Date   CARDIAC SURGERY  03/2010   bypass   CERVICAL FUSION  12/11/2012   C4-5,C5-6,C6-7    COLONOSCOPY  2011 ?   COLONOSCOPY WITH PROPOFOL N/A 07/11/2017   Procedure: COLONOSCOPY WITH PROPOFOL;  Surgeon: Toney Reil, MD;  Location[facility];  Service: Endoscopy;  Laterality: N/A;   POLYPECTOMY N/A 07/11/2017   Procedure: POLYPECTOMY;  Surgeon: Toney Reil, MD;  Location: [facility];  Service: Endoscopy;  Laterality: N/A;    Social History   Tobacco Use   Smoking status: Former    Current packs/day: 0.00    Average packs/day: 1.5 packs/day for 30.0 years (45.0 ttl pk-yrs)    Types: Cigarettes    Start date: 38    Quit date: 1997    Years since quitting: 28.0   Smokeless tobacco: Former    Types: Chew   Tobacco comments:    smoking cessation materials not required  Vaping Use   Vaping status: Never Used  Substance Use Topics   Alcohol use: No    Alcohol/week: 0.0 standard drinks of alcohol   Drug use: No     Medication list has been reviewed and updated.  Current Meds  Medication Sig   aspirin EC 81 MG tablet Take 1 tablet (81 mg total) by mouth daily.   atorvastatin (LIPITOR) 80 MG tablet TAKE 1 TABLET BY MOUTH ONCE  DAILY   metoprolol tartrate (LOPRESSOR) 25 MG tablet Take 0.5 tablets (12.5 mg total) by mouth 2 (two) times daily.   pantoprazole (PROTONIX) 40 MG tablet TAKE 1 TABLET BY MOUTH DAILY       11/16/2021   10:18 AM 05/19/2021    9:40 AM 08/17/2020    9:02 AM 02/16/2020    9:15 AM  GAD 7 : Generalized Anxiety Score  Nervous, Anxious, on Edge 0 0 0 0  Control/stop worrying 0 0 0 0  Worry too much - different things 1 0 0 0  Trouble relaxing 0 0 0 0  Restless 0 0 0 0  Easily annoyed or irritable 1 0 0 0  Afraid - awful might happen 1 0 0 0  Total GAD 7 Score 3 0 0 0  Anxiety Difficulty Not difficult at all Not difficult at all         05/19/2022   10:03 AM 02/01/2022    8:33 AM 11/16/2021   10:18 AM  Depression screen PHQ 2/9  Decreased Interest 0 0 0  Down, Depressed, Hopeless 0 0 0  PHQ - 2 Score 0 0 0   Altered sleeping 0 0 1  Tired, decreased energy 0 0 1  Change in appetite 0 0 0  Feeling bad or failure about yourself  0 0 0  Trouble concentrating 0 0 0  Moving slowly or fidgety/restless 0 0 0  Suicidal thoughts 0 0 0  PHQ-9 Score 0 0 2  Difficult doing work/chores Not difficult at all Not difficult at all Not difficult at all    BP Readings from Last 3 Encounters:  04/27/23 (!) 134/54  05/19/22 (!) 144/84  11/16/21 122/62    Physical Exam Vitals and nursing note reviewed.  Neck:     Thyroid: No thyromegaly.  Cardiovascular:     Rate and Rhythm: Normal rate and regular rhythm. No extrasystoles are present.  Chest Wall: PMI is not displaced.     Heart sounds:     No systolic murmur is present.     No diastolic murmur is present.     No S3 or S4 sounds.  Pulmonary:     Effort: Pulmonary effort is normal.     Breath sounds: No decreased breath sounds, wheezing, rhonchi or rales.  Chest:     Chest wall: No tenderness.  Abdominal:     Palpations: Abdomen is soft. There is no hepatomegaly or splenomegaly.     Tenderness: There is no guarding.  Musculoskeletal:     Cervical back: Neck supple.  Neurological:     Mental Status: He is alert.     Wt Readings from Last 3 Encounters:  04/27/23 182 lb (82.6 kg)  05/19/22 190 lb (86.2 kg)  02/01/22 190 lb (86.2 kg)    BP (!) 134/54   Pulse 91   Ht 5\' 6"  (1.676 m)   Wt 182 lb (82.6 kg)   SpO2 98%   BMI 29.38 kg/m   Assessment and Plan:  1. Chest pain on breathing (Primary) New onset.  Recurrent.  Currently stable.  And that the last episode was last week.  Only occurs with exertion.  EKG was done with the following results rate 78 regular intervals normal no LVH criteria met by voltage.  No ischemic changes noted except there is some nonspecific ST depression that is nondiagnostic in the lateral leads.  Referral has been made with [facility] cardiology as he has seen Dr. Guss Bunde within the past.  Patient has been  instructed that if he should have pain that he is to take nitroglycerin that has been provided.  He is to continue his aspirin 81 mg a day. - EKG 12-Lead - nitroGLYCERIN (NITROSTAT) 0.4 MG SL tablet; Place 1 tablet (0.4 mg total) under the tongue every 5 (five) minutes as needed for chest pain.  Dispense: 50 tablet; Refill: 3 - Ambulatory referral to Cardiology  2. Essential hypertension Chronic.  Controlled.  Stable.  Blood pressure today is 136/60.  Patient will continue with current medications which includes metoprolol 25 mg 1/2 tablet 12.5 mg for blood pressure control.  Patient has been instructed if he has difficulty with chest pain shortness of breath dizziness or near syncope he is to go to the hospital. - nitroGLYCERIN (NITROSTAT) 0.4 MG SL tablet; Place 1 tablet (0.4 mg total) under the tongue every 5 (five) minutes as needed for chest pain.  Dispense: 50 tablet; Refill: 3 - Ambulatory referral to Cardiology    Elizabeth Sauer, MD

## 2023-05-02 DIAGNOSIS — I25118 Atherosclerotic heart disease of native coronary artery with other forms of angina pectoris: Secondary | ICD-10-CM | POA: Diagnosis not present

## 2023-05-02 DIAGNOSIS — I2089 Other forms of angina pectoris: Secondary | ICD-10-CM | POA: Diagnosis not present

## 2023-05-02 DIAGNOSIS — N182 Chronic kidney disease, stage 2 (mild): Secondary | ICD-10-CM | POA: Diagnosis not present

## 2023-05-02 DIAGNOSIS — I10 Essential (primary) hypertension: Secondary | ICD-10-CM | POA: Diagnosis not present

## 2023-05-02 DIAGNOSIS — Z951 Presence of aortocoronary bypass graft: Secondary | ICD-10-CM | POA: Diagnosis not present

## 2023-05-02 DIAGNOSIS — K219 Gastro-esophageal reflux disease without esophagitis: Secondary | ICD-10-CM | POA: Diagnosis not present

## 2023-05-02 DIAGNOSIS — R0602 Shortness of breath: Secondary | ICD-10-CM | POA: Diagnosis not present

## 2023-05-02 DIAGNOSIS — E785 Hyperlipidemia, unspecified: Secondary | ICD-10-CM | POA: Diagnosis not present

## 2023-05-04 ENCOUNTER — Other Ambulatory Visit: Payer: Self-pay | Admitting: Internal Medicine

## 2023-05-04 DIAGNOSIS — I25118 Atherosclerotic heart disease of native coronary artery with other forms of angina pectoris: Secondary | ICD-10-CM

## 2023-05-16 DIAGNOSIS — R0602 Shortness of breath: Secondary | ICD-10-CM | POA: Diagnosis not present

## 2023-05-16 DIAGNOSIS — I2089 Other forms of angina pectoris: Secondary | ICD-10-CM | POA: Diagnosis not present

## 2023-05-23 ENCOUNTER — Telehealth (HOSPITAL_COMMUNITY): Payer: Self-pay | Admitting: *Deleted

## 2023-05-23 NOTE — Telephone Encounter (Signed)
 Reaching out to patient to offer assistance regarding upcoming cardiac imaging study; pt verbalizes understanding of appt date/time, parking situation and where to check in, pre-test NPO status and medications ordered, and verified current allergies; name and call back number provided for further questions should they arise Matthew Frame RN Navigator Cardiac Imaging [facility] [phone] office [phone] cell  Patient vitals while on phone: BP 149/53 HR 65.  Patient aware to take 50mg  metoprolol tartrate 2 hours prior to test and arrive at 11:05 AM.

## 2023-05-23 NOTE — Telephone Encounter (Signed)
 Attempted to call patient regarding upcoming cardiac CT appointment. Unable to leave VM. Johney Frame RN Navigator Cardiac Imaging [facility] [phone] Office

## 2023-05-24 ENCOUNTER — Ambulatory Visit
Admission: RE | Admit: 2023-05-24 | Discharge: 2023-05-24 | Disposition: A | Discharge status: Home / Self Care | Payer: 59 | Source: Ambulatory Visit | Attending: Internal Medicine | Admitting: Internal Medicine

## 2023-05-24 DIAGNOSIS — I25118 Atherosclerotic heart disease of native coronary artery with other forms of angina pectoris: Secondary | ICD-10-CM | POA: Diagnosis not present

## 2023-05-24 LAB — POCT I-STAT CREATININE: Creatinine, Ser: 1.3 mg/dL — ABNORMAL HIGH (ref 0.61–1.24)

## 2023-05-24 MED ORDER — NITROGLYCERIN 0.4 MG SL SUBL
0.8000 mg | SUBLINGUAL_TABLET | Freq: Once | SUBLINGUAL | Status: AC
  Administered 2023-05-24: 0.8 mg via SUBLINGUAL

## 2023-05-24 MED ORDER — SODIUM CHLORIDE 0.9 % IV SOLN: INTRAVENOUS | Status: DC

## 2023-05-24 MED ORDER — IOHEXOL 350 MG/ML SOLN
75.0000 mL | Freq: Once | INTRAVENOUS | Status: AC | PRN
  Administered 2023-05-24: 75 mL via INTRAVENOUS

## 2023-05-24 NOTE — Progress Notes (Signed)
 Patient tolerated procedure well. Ambulate w/o difficulty. Denies light headedness or being dizzy. Sitting up drinking water provided. Encouraged to drink extra water today and reasoning explained. Verbalized understanding. All questions answered. ABC intact. No further needs. Discharge from procedure area w/o issues.

## 2023-11-01 ENCOUNTER — Ambulatory Visit (INDEPENDENT_AMBULATORY_CARE_PROVIDER_SITE_OTHER): Admitting: Family Medicine

## 2023-11-01 ENCOUNTER — Encounter: Payer: Self-pay | Admitting: Family Medicine

## 2023-11-01 VITALS — BP 150/64 | HR 80 | Ht 66.0 in | Wt 187.2 lb

## 2023-11-01 DIAGNOSIS — M4726 Other spondylosis with radiculopathy, lumbar region: Secondary | ICD-10-CM | POA: Insufficient documentation

## 2023-11-01 MED ORDER — MELOXICAM 7.5 MG PO TABS: 7.5000 mg | ORAL_TABLET | Freq: Every day | ORAL | 0 refills | Status: DC | PRN

## 2023-11-01 MED ORDER — PREDNISONE 10 MG PO TABS: 10.0000 mg | ORAL_TABLET | Freq: Two times a day (BID) | ORAL | 0 refills | Status: AC

## 2023-11-01 MED ORDER — BACLOFEN 5 MG PO TABS: 1.0000 | ORAL_TABLET | Freq: Three times a day (TID) | ORAL | 0 refills | Status: AC | PRN

## 2023-11-01 NOTE — Progress Notes (Signed)
 Primary Care / Sports Medicine Office Visit  Patient Information:  Patient ID: Matthew Humphrey, male DOB: [birth year] Age: 78 y.o. MRN: [id number]   Matthew Humphrey is a pleasant 78 y.o. male presenting with the following:  Chief Complaint  Patient presents with   Leg Pain    Bil leg pain x 1 week. NKI. Patient has constant pain in both legs from hips down to ankles.  He has been taking Ibuprofen for pain and that helps some. Standing and walking are aggravating factors for him. He has not had any physical therapy or new xray's. Last xrays of knee in 2018 and MRI of lumbar spine in 2020.    Vitals:   11/01/23 1007  BP: (!) 150/64  Pulse: 80  SpO2: 94%   Vitals:   11/01/23 1007  Weight: 187 lb 3.2 oz (84.9 kg)  Height: 5' 6 (1.676 m)   Body mass index is 30.21 kg/m.  No results found.   Independent interpretation of notes and tests performed by another provider:   Reviewed and interpreted MRI lumbar spine from 09/2018 with patient during office visit independently.  Procedures performed:   None  Pertinent History, Exam, Impression, and Recommendations:   Problem List Items Addressed This Visit     Osteoarthritis of spine with radiculopathy, lumbar region - Primary   Acute on chronic lower lumbar and bilateral hip pain with intermittent radiation of symptoms down the left more so than right leg. Denies any saddle paresthesia, no bowel / bladder incontinence. Has taken ibuprofen with positive response.  Physical Exam Lumbar Spine Exam  INSPECTION: Slightly kyphotic posture noted PALPATION: Tenderness at right greater than left sacroiliac joints, Non-tender midline lumbar spine and paraspinal regions RANGE OF MOTION (ROM) ASSESSMENT: Lumbar extension limited and painful MUSCLE STRENGTH TESTING: 5-/5 strength with resisted right hip flexion, otherwise strength preserved NEUROMUSCULAR: Lower extremity sensorimotor exam grossly intact and benign SPECIAL TESTS:  Straight leg raise negative bilaterally, Kemp's test positive on the left greater than right  Assessment & Plan Lumbar spondylosis with left radiculopathy Findings today consistent with exacerbation of lower lumbar degenerative changes with intermittent left greater than right radiculopathy. We reviewed options and he will proceed in pharmacotherapy. Renal function and comorbid conditions reviewed. - Start prednisone  x 7 days - After prednisone  take meloxicam  daily as-needed for any remaining symptoms - Can dose baclofen  as-needed for muscle pain, side effect can be drowsiness - Return in 6 weeks      Relevant Medications   predniSONE  (DELTASONE ) 10 MG tablet   meloxicam  (MOBIC ) 7.5 MG tablet   Baclofen  5 MG TABS     Orders & Medications Medications:  Meds ordered this encounter  Medications   predniSONE  (DELTASONE ) 10 MG tablet    Sig: Take 1 tablet (10 mg total) by mouth 2 (two) times daily with a meal for 7 days.    Dispense:  14 tablet    Refill:  0   meloxicam  (MOBIC ) 7.5 MG tablet    Sig: Take 1 tablet (7.5 mg total) by mouth daily as needed for pain.    Dispense:  14 tablet    Refill:  0   Baclofen  5 MG TABS    Sig: Take 1 tablet (5 mg total) by mouth 3 (three) times daily as needed.    Dispense:  30 tablet    Refill:  0   No orders of the defined types were placed in this encounter.    No follow-ups on  file.     Selinda JINNY Ku, MD, [facility]   Primary Care Sports Medicine Primary Care and Sports Medicine at [facility]

## 2023-11-01 NOTE — Assessment & Plan Note (Signed)
 Acute on chronic lower lumbar and bilateral hip pain with intermittent radiation of symptoms down the left more so than right leg. Denies any saddle paresthesia, no bowel / bladder incontinence. Has taken ibuprofen with positive response.  Physical Exam Lumbar Spine Exam  INSPECTION: Slightly kyphotic posture noted PALPATION: Tenderness at right greater than left sacroiliac joints, Non-tender midline lumbar spine and paraspinal regions RANGE OF MOTION (ROM) ASSESSMENT: Lumbar extension limited and painful MUSCLE STRENGTH TESTING: 5-/5 strength with resisted right hip flexion, otherwise strength preserved NEUROMUSCULAR: Lower extremity sensorimotor exam grossly intact and benign SPECIAL TESTS: Straight leg raise negative bilaterally, Kemp's test positive on the left greater than right  Assessment & Plan Lumbar spondylosis with left radiculopathy Findings today consistent with exacerbation of lower lumbar degenerative changes with intermittent left greater than right radiculopathy. We reviewed options and he will proceed in pharmacotherapy. Renal function and comorbid conditions reviewed. - Start prednisone  x 7 days - After prednisone  take meloxicam  daily as-needed for any remaining symptoms - Can dose baclofen  as-needed for muscle pain, side effect can be drowsiness - Return in 6 weeks

## 2023-11-01 NOTE — Patient Instructions (Signed)
-   Start prednisone  x 7 days - After prednisone  take meloxicam  daily as-needed for any remaining symptoms - Can dose baclofen  as-needed for muscle pain, side effect can be drowsiness - Return in 6 weeks

## 2023-11-16 ENCOUNTER — Encounter: Payer: Self-pay | Admitting: Family Medicine

## 2023-11-16 ENCOUNTER — Ambulatory Visit (INDEPENDENT_AMBULATORY_CARE_PROVIDER_SITE_OTHER): Admitting: Family Medicine

## 2023-11-16 ENCOUNTER — Inpatient Hospital Stay
Admission: EM | Admit: 2023-11-16 | Discharge: 2023-11-25 | DRG: 329 | Disposition: A | Discharge status: Home / Self Care | Source: Ambulatory Visit | Attending: Internal Medicine | Admitting: Internal Medicine

## 2023-11-16 ENCOUNTER — Telehealth: Payer: Self-pay

## 2023-11-16 ENCOUNTER — Other Ambulatory Visit: Payer: Self-pay

## 2023-11-16 ENCOUNTER — Emergency Department

## 2023-11-16 ENCOUNTER — Telehealth: Payer: Self-pay | Admitting: Family Medicine

## 2023-11-16 ENCOUNTER — Other Ambulatory Visit
Admission: RE | Admit: 2023-11-16 | Discharge: 2023-11-16 | Disposition: A | Discharge status: Home / Self Care | Source: Home / Self Care | Attending: Family Medicine | Admitting: Family Medicine

## 2023-11-16 ENCOUNTER — Ambulatory Visit
Admission: RE | Admit: 2023-11-16 | Discharge: 2023-11-16 | Disposition: A | Discharge status: Home / Self Care | Attending: Family Medicine | Admitting: Family Medicine

## 2023-11-16 ENCOUNTER — Ambulatory Visit
Admission: RE | Admit: 2023-11-16 | Discharge: 2023-11-16 | Disposition: A | Discharge status: Home / Self Care | Source: Ambulatory Visit | Attending: Family Medicine | Admitting: Family Medicine

## 2023-11-16 VITALS — BP 108/50 | HR 61 | Ht 66.0 in | Wt 187.0 lb

## 2023-11-16 DIAGNOSIS — I2510 Atherosclerotic heart disease of native coronary artery without angina pectoris: Secondary | ICD-10-CM | POA: Diagnosis present

## 2023-11-16 DIAGNOSIS — K641 Second degree hemorrhoids: Secondary | ICD-10-CM | POA: Diagnosis present

## 2023-11-16 DIAGNOSIS — E785 Hyperlipidemia, unspecified: Secondary | ICD-10-CM | POA: Diagnosis present

## 2023-11-16 DIAGNOSIS — Z881 Allergy status to other antibiotic agents status: Secondary | ICD-10-CM

## 2023-11-16 DIAGNOSIS — N189 Chronic kidney disease, unspecified: Secondary | ICD-10-CM | POA: Diagnosis present

## 2023-11-16 DIAGNOSIS — K2970 Gastritis, unspecified, without bleeding: Secondary | ICD-10-CM | POA: Diagnosis present

## 2023-11-16 DIAGNOSIS — D630 Anemia in neoplastic disease: Secondary | ICD-10-CM | POA: Diagnosis present

## 2023-11-16 DIAGNOSIS — N182 Chronic kidney disease, stage 2 (mild): Secondary | ICD-10-CM | POA: Diagnosis present

## 2023-11-16 DIAGNOSIS — I5041 Acute combined systolic (congestive) and diastolic (congestive) heart failure: Secondary | ICD-10-CM | POA: Diagnosis present

## 2023-11-16 DIAGNOSIS — D62 Acute posthemorrhagic anemia: Secondary | ICD-10-CM | POA: Diagnosis present

## 2023-11-16 DIAGNOSIS — D124 Benign neoplasm of descending colon: Secondary | ICD-10-CM | POA: Diagnosis present

## 2023-11-16 DIAGNOSIS — I10 Essential (primary) hypertension: Secondary | ICD-10-CM | POA: Diagnosis not present

## 2023-11-16 DIAGNOSIS — K922 Gastrointestinal hemorrhage, unspecified: Secondary | ICD-10-CM

## 2023-11-16 DIAGNOSIS — K219 Gastro-esophageal reflux disease without esophagitis: Secondary | ICD-10-CM | POA: Diagnosis present

## 2023-11-16 DIAGNOSIS — R0602 Shortness of breath: Secondary | ICD-10-CM

## 2023-11-16 DIAGNOSIS — K660 Peritoneal adhesions (postprocedural) (postinfection): Secondary | ICD-10-CM | POA: Diagnosis present

## 2023-11-16 DIAGNOSIS — Z6829 Body mass index (BMI) 29.0-29.9, adult: Secondary | ICD-10-CM

## 2023-11-16 DIAGNOSIS — M4726 Other spondylosis with radiculopathy, lumbar region: Secondary | ICD-10-CM | POA: Diagnosis present

## 2023-11-16 DIAGNOSIS — I25810 Atherosclerosis of coronary artery bypass graft(s) without angina pectoris: Secondary | ICD-10-CM | POA: Diagnosis present

## 2023-11-16 DIAGNOSIS — I509 Heart failure, unspecified: Secondary | ICD-10-CM

## 2023-11-16 DIAGNOSIS — R0789 Other chest pain: Secondary | ICD-10-CM | POA: Diagnosis present

## 2023-11-16 DIAGNOSIS — K3189 Other diseases of stomach and duodenum: Secondary | ICD-10-CM | POA: Diagnosis present

## 2023-11-16 DIAGNOSIS — Z79899 Other long term (current) drug therapy: Secondary | ICD-10-CM

## 2023-11-16 DIAGNOSIS — K5669 Other partial intestinal obstruction: Secondary | ICD-10-CM | POA: Diagnosis present

## 2023-11-16 DIAGNOSIS — K644 Residual hemorrhoidal skin tags: Secondary | ICD-10-CM | POA: Diagnosis present

## 2023-11-16 DIAGNOSIS — D649 Anemia, unspecified: Principal | ICD-10-CM | POA: Diagnosis present

## 2023-11-16 DIAGNOSIS — E669 Obesity, unspecified: Secondary | ICD-10-CM | POA: Diagnosis present

## 2023-11-16 DIAGNOSIS — K2289 Other specified disease of esophagus: Secondary | ICD-10-CM | POA: Diagnosis present

## 2023-11-16 DIAGNOSIS — Z555 Less than a high school diploma: Secondary | ICD-10-CM

## 2023-11-16 DIAGNOSIS — I255 Ischemic cardiomyopathy: Secondary | ICD-10-CM | POA: Diagnosis present

## 2023-11-16 DIAGNOSIS — I25709 Atherosclerosis of coronary artery bypass graft(s), unspecified, with unspecified angina pectoris: Secondary | ICD-10-CM | POA: Diagnosis present

## 2023-11-16 DIAGNOSIS — K6389 Other specified diseases of intestine: Secondary | ICD-10-CM | POA: Diagnosis not present

## 2023-11-16 DIAGNOSIS — R195 Other fecal abnormalities: Secondary | ICD-10-CM | POA: Diagnosis present

## 2023-11-16 DIAGNOSIS — I130 Hypertensive heart and chronic kidney disease with heart failure and stage 1 through stage 4 chronic kidney disease, or unspecified chronic kidney disease: Secondary | ICD-10-CM | POA: Diagnosis present

## 2023-11-16 DIAGNOSIS — C182 Malignant neoplasm of ascending colon: Secondary | ICD-10-CM | POA: Diagnosis present

## 2023-11-16 DIAGNOSIS — Q438 Other specified congenital malformations of intestine: Secondary | ICD-10-CM | POA: Diagnosis not present

## 2023-11-16 DIAGNOSIS — Z8249 Family history of ischemic heart disease and other diseases of the circulatory system: Secondary | ICD-10-CM

## 2023-11-16 DIAGNOSIS — K5730 Diverticulosis of large intestine without perforation or abscess without bleeding: Secondary | ICD-10-CM | POA: Diagnosis present

## 2023-11-16 DIAGNOSIS — D72829 Elevated white blood cell count, unspecified: Secondary | ICD-10-CM | POA: Diagnosis present

## 2023-11-16 DIAGNOSIS — Z981 Arthrodesis status: Secondary | ICD-10-CM

## 2023-11-16 DIAGNOSIS — N1831 Chronic kidney disease, stage 3a: Secondary | ICD-10-CM

## 2023-11-16 DIAGNOSIS — Z87891 Personal history of nicotine dependence: Secondary | ICD-10-CM

## 2023-11-16 DIAGNOSIS — Z882 Allergy status to sulfonamides status: Secondary | ICD-10-CM

## 2023-11-16 DIAGNOSIS — Z7982 Long term (current) use of aspirin: Secondary | ICD-10-CM

## 2023-11-16 LAB — CBC WITH DIFFERENTIAL/PLATELET
Abs Granulocyte: 4.2 K/uL (ref 1.5–6.5)
Abs Immature Granulocytes: 0.03 K/uL (ref 0.00–0.07)
Basophils Absolute: 0.1 K/uL (ref 0.0–0.1)
Basophils Relative: 1 %
Eosinophils Absolute: 0.3 K/uL (ref 0.0–0.5)
Eosinophils Relative: 5 %
HCT: 26.2 % — ABNORMAL LOW (ref 39.0–52.0)
Hemoglobin: 7.4 g/dL — ABNORMAL LOW (ref 13.0–17.0)
Immature Granulocytes: 0 %
Lymphocytes Relative: 22 %
Lymphs Abs: 1.5 K/uL (ref 0.7–4.0)
MCH: 18.9 pg — ABNORMAL LOW (ref 26.0–34.0)
MCHC: 28.2 g/dL — ABNORMAL LOW (ref 30.0–36.0)
MCV: 67 fL — ABNORMAL LOW (ref 80.0–100.0)
Monocytes Absolute: 0.7 K/uL (ref 0.1–1.0)
Monocytes Relative: 11 %
Neutro Abs: 4.2 K/uL (ref 1.7–7.7)
Neutrophils Relative %: 61 %
Platelets: 331 K/uL (ref 150–400)
RBC: 3.91 MIL/uL — ABNORMAL LOW (ref 4.22–5.81)
RDW: 20 % — ABNORMAL HIGH (ref 11.5–15.5)
WBC: 6.8 K/uL (ref 4.0–10.5)
nRBC: 0.4 % — ABNORMAL HIGH (ref 0.0–0.2)

## 2023-11-16 LAB — CBC
HCT: 26.2 % — ABNORMAL LOW (ref 39.0–52.0)
HCT: 29.5 % — ABNORMAL LOW (ref 39.0–52.0)
Hemoglobin: 7 g/dL — ABNORMAL LOW (ref 13.0–17.0)
Hemoglobin: 8.4 g/dL — ABNORMAL LOW (ref 13.0–17.0)
MCH: 18.3 pg — ABNORMAL LOW (ref 26.0–34.0)
MCH: 20 pg — ABNORMAL LOW (ref 26.0–34.0)
MCHC: 26.7 g/dL — ABNORMAL LOW (ref 30.0–36.0)
MCHC: 28.5 g/dL — ABNORMAL LOW (ref 30.0–36.0)
MCV: 68.6 fL — ABNORMAL LOW (ref 80.0–100.0)
MCV: 70.4 fL — ABNORMAL LOW (ref 80.0–100.0)
Platelets: 335 K/uL (ref 150–400)
Platelets: 298 K/uL (ref 150–400)
RBC: 3.82 MIL/uL — ABNORMAL LOW (ref 4.22–5.81)
RBC: 4.19 MIL/uL — ABNORMAL LOW (ref 4.22–5.81)
RDW: 19.6 % — ABNORMAL HIGH (ref 11.5–15.5)
RDW: 21.9 % — ABNORMAL HIGH (ref 11.5–15.5)
WBC: 7.6 K/uL (ref 4.0–10.5)
WBC: 7.5 K/uL (ref 4.0–10.5)
nRBC: 0.5 % — ABNORMAL HIGH (ref 0.0–0.2)
nRBC: 0.3 % — ABNORMAL HIGH (ref 0.0–0.2)

## 2023-11-16 LAB — BASIC METABOLIC PANEL WITH GFR
Anion gap: 5 (ref 5–15)
Anion gap: 7 (ref 5–15)
BUN: 23 mg/dL (ref 8–23)
BUN: 21 mg/dL (ref 8–23)
CO2: 25 mmol/L (ref 22–32)
CO2: 24 mmol/L (ref 22–32)
Calcium: 9.2 mg/dL (ref 8.9–10.3)
Calcium: 9.2 mg/dL (ref 8.9–10.3)
Chloride: 111 mmol/L (ref 98–111)
Chloride: 109 mmol/L (ref 98–111)
Creatinine, Ser: 1.17 mg/dL (ref 0.61–1.24)
Creatinine, Ser: 1.17 mg/dL (ref 0.61–1.24)
GFR, Estimated: >60 mL/min (ref >60)
GFR, Estimated: >60 mL/min (ref >60)
Glucose, Bld: 99 mg/dL (ref 70–99)
Glucose, Bld: 91 mg/dL (ref 70–99)
Potassium: 4.3 mmol/L (ref 3.5–5.1)
Potassium: 3.9 mmol/L (ref 3.5–5.1)
Sodium: 141 mmol/L (ref 135–145)
Sodium: 140 mmol/L (ref 135–145)

## 2023-11-16 LAB — BRAIN NATRIURETIC PEPTIDE
B Natriuretic Peptide: 356.7 pg/mL — ABNORMAL HIGH (ref 0.0–100.0)
B Natriuretic Peptide: 290.3 pg/mL — ABNORMAL HIGH (ref 0.0–100.0)

## 2023-11-16 LAB — COMPREHENSIVE METABOLIC PANEL WITH GFR
ALT: 43 U/L (ref 0–44)
AST: 41 U/L (ref 15–41)
Albumin: 3.1 g/dL — ABNORMAL LOW (ref 3.5–5.0)
Alkaline Phosphatase: 76 U/L (ref 38–126)
Anion gap: 8 (ref 5–15)
BUN: 23 mg/dL (ref 8–23)
CO2: 25 mmol/L (ref 22–32)
Calcium: 9.2 mg/dL (ref 8.9–10.3)
Chloride: 107 mmol/L (ref 98–111)
Creatinine, Ser: 1.31 mg/dL — ABNORMAL HIGH (ref 0.61–1.24)
GFR, Estimated: 56 mL/min — ABNORMAL LOW (ref >60)
Glucose, Bld: 80 mg/dL (ref 70–99)
Potassium: 4 mmol/L (ref 3.5–5.1)
Sodium: 140 mmol/L (ref 135–145)
Total Bilirubin: 0.4 mg/dL (ref 0.0–1.2)
Total Protein: 6.6 g/dL (ref 6.5–8.1)

## 2023-11-16 LAB — PREPARE RBC (CROSSMATCH)

## 2023-11-16 LAB — TROPONIN I (HIGH SENSITIVITY)
Troponin I (High Sensitivity): 16 ng/L (ref <18)
Troponin I (High Sensitivity): 16 ng/L (ref <18)
Troponin I (High Sensitivity): 19 ng/L — ABNORMAL HIGH (ref <18)

## 2023-11-16 LAB — SAMPLE TO BLOOD BANK

## 2023-11-16 LAB — ABO/RH: ABO/RH(D): O POS

## 2023-11-16 MED ORDER — METOPROLOL TARTRATE 25 MG PO TABS
12.5000 mg | ORAL_TABLET | Freq: Two times a day (BID) | ORAL | Status: DC
Start: 2023-11-16 — End: 2023-11-25
  Administered 2023-11-16 – 2023-11-25 (×17): 12.5 mg via ORAL
  Filled 2023-11-16 (×17): qty 1

## 2023-11-16 MED ORDER — ISOSORBIDE MONONITRATE ER 30 MG PO TB24
30.0000 mg | ORAL_TABLET | Freq: Every day | ORAL | Status: DC
  Administered 2023-11-17 – 2023-11-25 (×8): 30 mg via ORAL
  Filled 2023-11-16 (×8): qty 1

## 2023-11-16 MED ORDER — NITROGLYCERIN 0.4 MG SL SUBL
0.4000 mg | SUBLINGUAL_TABLET | SUBLINGUAL | Status: DC | PRN
  Administered 2023-11-18 (×2): 0.4 mg via SUBLINGUAL
  Filled 2023-11-16 (×2): qty 1

## 2023-11-16 MED ORDER — ASPIRIN 81 MG PO TBEC
81.0000 mg | DELAYED_RELEASE_TABLET | Freq: Every day | ORAL | Status: DC
  Filled 2023-11-16: qty 1

## 2023-11-16 MED ORDER — ACETAMINOPHEN 325 MG PO TABS
650.0000 mg | ORAL_TABLET | ORAL | Status: DC | PRN
  Administered 2023-11-19 – 2023-11-25 (×8): 650 mg via ORAL
  Filled 2023-11-16 (×8): qty 2

## 2023-11-16 MED ORDER — ATORVASTATIN CALCIUM 20 MG PO TABS
80.0000 mg | ORAL_TABLET | Freq: Every day | ORAL | Status: DC
  Administered 2023-11-17 – 2023-11-25 (×8): 80 mg via ORAL
  Filled 2023-11-16 (×3): qty 4
  Filled 2023-11-16: qty 1
  Filled 2023-11-16: qty 4
  Filled 2023-11-16: qty 1
  Filled 2023-11-16 (×2): qty 4

## 2023-11-16 MED ORDER — PANTOPRAZOLE SODIUM 40 MG IV SOLR
40.0000 mg | Freq: Two times a day (BID) | INTRAVENOUS | Status: DC
  Administered 2023-11-17 (×2): 40 mg via INTRAVENOUS
  Filled 2023-11-16 (×2): qty 10

## 2023-11-16 MED ORDER — SODIUM CHLORIDE 0.9 % IV SOLN
10.0000 mL/h | Freq: Once | INTRAVENOUS | Status: AC
  Administered 2023-11-16: 10 mL/h via INTRAVENOUS

## 2023-11-16 MED ORDER — ENOXAPARIN SODIUM 40 MG/0.4ML IJ SOSY
40.0000 mg | PREFILLED_SYRINGE | INTRAMUSCULAR | Status: DC
  Administered 2023-11-16: 40 mg via SUBCUTANEOUS
  Filled 2023-11-16: qty 0.4

## 2023-11-16 MED ORDER — BACLOFEN 10 MG PO TABS
5.0000 mg | ORAL_TABLET | Freq: Three times a day (TID) | ORAL | Status: DC | PRN
  Administered 2023-11-19 – 2023-11-25 (×9): 5 mg via ORAL
  Filled 2023-11-16 (×9): qty 1

## 2023-11-16 MED ORDER — AMLODIPINE BESYLATE 5 MG PO TABS
5.0000 mg | ORAL_TABLET | Freq: Every day | ORAL | Status: DC
  Administered 2023-11-17 – 2023-11-25 (×8): 5 mg via ORAL
  Filled 2023-11-16 (×8): qty 1

## 2023-11-16 MED ORDER — ONDANSETRON HCL 4 MG/2ML IJ SOLN: 4.0000 mg | Freq: Four times a day (QID) | INTRAMUSCULAR | Status: DC | PRN

## 2023-11-16 MED ORDER — PANTOPRAZOLE SODIUM 40 MG IV SOLR
40.0000 mg | Freq: Once | INTRAVENOUS | Status: AC
  Administered 2023-11-16: 40 mg via INTRAVENOUS
  Filled 2023-11-16: qty 10

## 2023-11-16 NOTE — Telephone Encounter (Signed)
 Copied from CRM #[id number]. Topic: Clinical - Request for Lab/Test Order >> Nov 16, 2023  1:16 PM Roselie BROCKS wrote: Reason for CRM: [place] [facility] calling for clinic

## 2023-11-16 NOTE — ED Provider Notes (Signed)
 [facility] Provider Note    Event Date/Time   First MD Initiated Contact with Patient 11/16/23 1756     (approximate)   History   Shortness of Breath   HPI  Matthew Humphrey is a 78 y.o. male with a past medical history of CKD, hypertension, CAD who presents with 3 months of progressively worsening shortness of breath with exertion, general fatigue and black stools sent from his doctor's office today with anemia.  Patient presents with his girlfriend who helps contribute to the history.  Patient reports that he has had dark stools for the past 3 months.  He was scheduled for colonoscopy which he self canceled.  Denies any bright red blood per rectum.  Denies any emesis.  He has no abdominal pain.  He intermittently gets chest pressure with exertion but has none currently at rest.  He is not on any anticoagulation.   Physical Exam   Triage Vital Signs: ED Triage Vitals  Encounter Vitals Group     BP 11/16/23 1333 135/64     Girls Systolic BP Percentile --      Girls Diastolic BP Percentile --      Boys Systolic BP Percentile --      Boys Diastolic BP Percentile --      Pulse Rate 11/16/23 1333 71     Resp 11/16/23 1333 20     Temp 11/16/23 1333 98 F (36.7 C)     Temp Source 11/16/23 1333 Oral     SpO2 11/16/23 1333 100 %     Weight 11/16/23 1334 180 lb (81.6 kg)     Height 11/16/23 1334 5' 6 (1.676 m)     Head Circumference --      Peak Flow --      Pain Score 11/16/23 1334 0     Pain Loc --      Pain Education --      Exclude from Growth Chart --     Most recent vital signs: Vitals:   11/16/23 2033 11/16/23 2048  BP: (!) 165/70 (!) 167/76  Pulse: 66 72  Resp: (!) 26 20  Temp: 98 F (36.7 C) 98 F (36.7 C)  SpO2: 99% 100%    Nursing Triage Note reviewed. Vital signs reviewed and patients oxygen saturation is normoxic  General: Patient is well nourished, well developed, awake and alert,  Head: Normocephalic and atraumatic Eyes:  Normal inspection, extraocular muscles intact, no conjunctival pallor Ear, nose, throat: Normal external exam Neck: Normal range of motion Respiratory: Patient is in no respiratory distress, lungs CTAB Cardiovascular: Patient is not tachycardic, RR GI: Abd SNT with no guarding or rebound   Rectal exam: Performed with nurse chaperone in the room.  Patient does have a large external hemorrhoid which is not actively bleeding.  Patient's stool was mildly melanotic and did test Hemoccult positive  Back: Normal inspection of the back with good strength and range of motion throughout all ext Extremities: pulses intact with good cap refills, no LE pitting edema or calf tenderness Neuro: The patient is alert and oriented to person, place, and time, appropriately conversive, with 5/5 bilat UE/LE strength, no gross motor or sensory defects noted. Coordination appears to be adequate. Skin: Warm, dry, and intact Psych: normal mood and affect, no SI or HI  ED Results / Procedures / Treatments   Labs (all labs ordered are listed, but only abnormal results are displayed) Labs Reviewed  CBC - Abnormal; Notable for the  following components:      Result Value   RBC 3.82 (*)    Hemoglobin 7.0 (*)    HCT 26.2 (*)    MCV 68.6 (*)    MCH 18.3 (*)    MCHC 26.7 (*)    RDW 19.6 (*)    nRBC 0.5 (*)    All other components within normal limits  BRAIN NATRIURETIC PEPTIDE - Abnormal; Notable for the following components:   B Natriuretic Peptide 290.3 (*)    All other components within normal limits  BASIC METABOLIC PANEL WITH GFR  CBC  BASIC METABOLIC PANEL WITH GFR  SAMPLE TO BLOOD BANK  PREPARE RBC (CROSSMATCH)  ABO/RH  TYPE AND SCREEN  TROPONIN I (HIGH SENSITIVITY)  TROPONIN I (HIGH SENSITIVITY)  TROPONIN I (HIGH SENSITIVITY)     EKG  EKG and rhythm strip are interpreted by myself:   EKG: [Normal sinus rhythm] at heart rate of 79, normal QRS duration, QTc 428, nonspecific ST segments and T  waves no ectopy EKG not consistent with Acute STEMI Rhythm strip: Normal sinus rhythm in lead II Patient does have ST depressions in the lateral leads and possible in lead II as well   RADIOLOGY None performed here by patient did have a chest x-ray from earlier today    PROCEDURES:  Critical Care performed: Yes  .Critical Care  Performed by: Nicholaus Rolland BRAVO, MD Authorized by: Nicholaus Rolland BRAVO, MD   Critical care provider statement:    Critical care time (minutes):  30   Critical care was necessary to treat or prevent imminent or life-threatening deterioration of the following conditions:  Circulatory failure   Critical care was time spent personally by me on the following activities:  Development of treatment plan with patient or surrogate, discussions with consultants, evaluation of patient's response to treatment, examination of patient, ordering and review of laboratory studies, ordering and review of radiographic studies, ordering and performing treatments and interventions, pulse oximetry, re-evaluation of patient's condition and review of old charts   Care discussed with: admitting provider   Comments:     Acute anemia requiring transfusion of blood, found to have EKG changes likely secondary to global ischemia and a cardiac patient    MEDICATIONS ORDERED IN ED: Medications  0.9 %  sodium chloride  infusion (has no administration in time range)  amLODipine  (NORVASC ) tablet 5 mg (has no administration in time range)  atorvastatin  (LIPITOR ) tablet 80 mg (has no administration in time range)  isosorbide  mononitrate (IMDUR ) 24 hr tablet 30 mg (has no administration in time range)  metoprolol  tartrate (LOPRESSOR ) tablet 12.5 mg (has no administration in time range)  nitroGLYCERIN  (NITROSTAT ) SL tablet 0.4 mg (has no administration in time range)  Baclofen  TABS 5 mg (has no administration in time range)  pantoprazole  (PROTONIX ) injection 40 mg (has no administration in time  range)  acetaminophen  (TYLENOL ) tablet 650 mg (has no administration in time range)  ondansetron  (ZOFRAN ) injection 4 mg (has no administration in time range)  enoxaparin  (LOVENOX ) injection 40 mg (has no administration in time range)  aspirin  EC tablet 81 mg (has no administration in time range)  pantoprazole  (PROTONIX ) injection 40 mg (40 mg Intravenous Given 11/16/23 1826)     IMPRESSION / MDM / ASSESSMENT AND PLAN / ED COURSE                                Differential diagnosis includes, but is  not limited to, upper GI bleed, acute anemia, electrolyte derangement, pneumonia  ED course: Given patient's chronicity of symptoms suspect that this is a slow upper GI bleed given his positive Hemoccult.  He is symptomatic and does have a cardiac history.  He does have depressions on his EKG but suspect this is secondary to global ischemia.  Abdomen exam is completely benign so we will hold off on CT imaging.  I reviewed the indications benefits risks and alternatives of blood transfusion with the patient and his significant other and both voiced understanding and opted to proceed.  1 unit of packed red blood cells.  Given his low propensity to follow-up and the EKG changes I would want to ensure that he is GI established an EKG changes resolved.  Case discussed with hospitalist for admission   Clinical Course as of 11/16/23 December 13, 2052  Fri Nov 16, 2023  1759 Hemoglobin(!): 7.0 Patient is anemic and symptomatic [HD]  2019/12/14 Troponin I (High Sensitivity): 16 Not elevated [HD]  2019/12/14 Hemoglobin(!): 7.0 [HD]  December 14, 2019 Case discussed with hospitalist for admission [HD]    Clinical Course User Index [HD] Nicholaus Rolland BRAVO, MD   --  Data(2/3 categories following were performed): I reviewed or ordered at least three unique tests, external notes, and/or the history required an independent historian as one of the three requirements as following: At least 3 labs/imaging studies were obtained and/or  reviewed. AND  I discussed the management of the patient with the following external physician or qualified healthcare provider: Admitting physician  Risk: This patient has a high risk of morbidity due to further diagnostic testing or treatment. Rationale: Decision made regarding admission  Admit Level 5 - Labs/Rads, Admit, Consult:  Suggested E/M Coding Level: 5, 99285  This level has been selected based on the 2021/12/13 CPT guidelines for E/M codes in the Emergency Department based on 2/3 of the CoPA, Data, and Risk.   FINAL CLINICAL IMPRESSION(S) / ED DIAGNOSES   Final diagnoses:  Symptomatic anemia  Upper GI bleed     Rx / DC Orders   ED Discharge Orders     None        Note:  This document was prepared using Dragon voice recognition software and may include unintentional dictation errors.   Nicholaus Rolland BRAVO, MD 11/16/23 12/13/2053

## 2023-11-16 NOTE — Assessment & Plan Note (Signed)
 On baclofen as needed

## 2023-11-16 NOTE — Telephone Encounter (Signed)
 Called patient to discuss his recent call to our office. Patient informed me that he was just pulling up to the parking lot of [facility] Emergency Department. Patient was wondering if he needed an order for the emergency department per Dr. Quenton instructions. Informed the patient that we do not place orders for the emergency department. They will place their own orders. Patient verbalized understanding.

## 2023-11-16 NOTE — Telephone Encounter (Signed)
 Copied from CRM #[id number]. Topic: Clinical - Request for Lab/Test Order >> Nov 16, 2023 12:45 PM Nathanel BROCKS wrote: Reason for CRM: pt is at the hosp, Dr Sol sent him over for a blood transfusion but they do not have an order for him. Please advise

## 2023-11-16 NOTE — Assessment & Plan Note (Signed)
 Changed from p.o. to IV Protonix  given question of upper GI bleed

## 2023-11-16 NOTE — Assessment & Plan Note (Signed)
 Continue amlodipine

## 2023-11-16 NOTE — Telephone Encounter (Signed)
 Pt is at the ER.  KP

## 2023-11-16 NOTE — Assessment & Plan Note (Signed)
 Creatinine appears stable at 1.31 Avoid nephrotoxic agents Trend

## 2023-11-16 NOTE — ED Triage Notes (Signed)
 Pt went to his dr's office this am due to feeling sob, pt had blood work performed and a cxr and was called back and told that his hgb was low and that he may need a blood transfusion to come to the ER. Pt states sob for a few months

## 2023-11-16 NOTE — Assessment & Plan Note (Addendum)
 Patient denies hematemesis, frank rectal bleeding.  He does report dark stools every now and again. Hemoglobin is 7 but vital signs appear stable, more consistent with a chronic blood loss. Consider GI consultation for EGD either as an outpatient or with close outpatient follow-up.

## 2023-11-16 NOTE — ED Notes (Signed)
 Pt had xray earlier done today and so xray order cancelled.

## 2023-11-16 NOTE — Telephone Encounter (Signed)
 Hi Amy, I reached out to Radiology, she has no information about this.   Kie please check with the pt if he was able to go to ER.  Ty

## 2023-11-16 NOTE — Assessment & Plan Note (Signed)
 1 unit of packed red blood cells to be given now Check CBC in the a.m.

## 2023-11-16 NOTE — H&P (Addendum)
 History and Physical    Patient: Matthew Humphrey FMW:[id number] DOB: November 10, 1945 DOA: 11/16/2023 DOS: the patient was seen and examined on 11/16/2023 PCP: Sol Mackey POUR, MD  Patient coming from: Home  Chief Complaint:  Chief Complaint  Patient presents with   Shortness of Breath   HPI: Matthew Humphrey is a 78 y.o. male with medical history significant of coronary artery disease status post CABG 2012, HTN, HLD, GERD, CKD who reports several month history of increasing shortness of breath, chest pain, dark stools.  He was seen by his primary care physician today in the office and was noted to have low hemoglobin at 7 and an elevated BNP and was sent to the ED.  These measurements were confirmed in the ED.  The patient was noted to have heme positive stools.  Chest x-ray revealed a right basilar opacity which was a question of infiltrate versus pneumonia and small right pleural effusion.  Patient reports lower extremity swelling as well.  Of note the patient had a coronary CT in March of this year showing RCA with greater than 70% stenosis, LAD with greater than 70% stenosis, left circumflex with greater than 70% stenosis.  Patient is not had follow-up since then.  Patient was supposed to have colonoscopy approximately 3 weeks ago he missed this.  Previous colonoscopy was done in 2019 and was essentially normal.  Patient was noted to have inferolateral ST depression on EKG.  This is thought to be related to anemia.  Review of Systems: As mentioned in the history of present illness. All other systems reviewed and are negative. Past Medical History:  Diagnosis Date   Arthritis    knee   Chronic kidney disease    GERD (gastroesophageal reflux disease)    Hyperlipidemia    Hypertension    Past Surgical History:  Procedure Laterality Date   CARDIAC SURGERY  03/2010   bypass   CERVICAL FUSION  12/11/2012   C4-5,C5-6,C6-7   COLONOSCOPY  2011 ?   COLONOSCOPY WITH PROPOFOL  N/A 07/11/2017    Procedure: COLONOSCOPY WITH PROPOFOL ;  Surgeon: Unk Corinn Skiff, MD;  Location: [facility];  Service: Endoscopy;  Laterality: N/A;   POLYPECTOMY N/A 07/11/2017   Procedure: POLYPECTOMY;  Surgeon: Unk Corinn Skiff, MD;  Location: [facility];  Service: Endoscopy;  Laterality: N/A;   Social History:  reports that he quit smoking about 28 years ago. His smoking use included cigarettes. He started smoking about 58 years ago. He has a 45 pack-year smoking history. He has quit using smokeless tobacco.  His smokeless tobacco use included chew. He reports that he does not drink alcohol and does not use drugs.  Allergies  Allergen Reactions   Sulfa Antibiotics Other (See Comments)   Sulfamethoxazole-Trimethoprim Other (See Comments)    Other reaction(s): Unknown Other reaction(s): Unknown    Sulfasalazine Other (See Comments)    Family History  Problem Relation Age of Onset   Hypertension Father    Hypotension Mother     Prior to Admission medications   Medication Sig Start Date End Date Taking? Authorizing Provider  amLODipine  (NORVASC ) 5 MG tablet Take 5 mg by mouth daily.    [provider]  aspirin  EC 81 MG tablet Take 1 tablet (81 mg total) by mouth daily. 09/11/19   Joshua Cathryne BROCKS, MD  atorvastatin  (LIPITOR ) 80 MG tablet TAKE 1 TABLET BY MOUTH ONCE  DAILY 04/25/23   Joshua Cathryne BROCKS, MD  Baclofen  5 MG TABS Take 1 tablet (  5 mg total) by mouth 3 (three) times daily as needed. 11/01/23   Matthews, Jason J, MD  isosorbide  mononitrate (IMDUR ) 30 MG 24 hr tablet Take 30 mg by mouth daily.    [provider]  metoprolol  tartrate (LOPRESSOR ) 25 MG tablet Take 0.5 tablets (12.5 mg total) by mouth 2 (two) times daily. 05/19/22   Joshua Cathryne BROCKS, MD  nitroGLYCERIN  (NITROSTAT ) 0.4 MG SL tablet Place 1 tablet (0.4 mg total) under the tongue every 5 (five) minutes as needed for chest pain. 04/27/23   Joshua Cathryne BROCKS, MD  pantoprazole  (PROTONIX ) 40 MG tablet TAKE 1  TABLET BY MOUTH DAILY 01/30/23   Joshua Cathryne BROCKS, MD    Physical Exam: Vitals:   11/16/23 1334 11/16/23 1750 11/16/23 1820 11/16/23 1826  BP:  (!) 147/87  (!) 163/74  Pulse:  60  (!) 53  Resp:  18  15  Temp:  98 F (36.7 C)    TempSrc:      SpO2:  100% 100% 100%  Weight: 81.6 kg     Height: 5' 6 (1.676 m)      Physical Examination: General appearance - alert, well appearing, and in no distress Neck - supple, no significant adenopathy Chest - clear to auscultation, no wheezes, rales or rhonchi, symmetric air entry Heart - normal rate, regular rhythm, normal S1, S2, no murmurs, rubs, clicks or gallops Abdomen - soft, nontender, nondistended, no masses or organomegaly Extremities - pedal edema trace +  Data Reviewed: Results for orders placed or performed during the hospital encounter of 11/16/23 (from the past 24 hours)  Basic metabolic panel     Status: None   Collection Time: 11/16/23  1:38 PM  Result Value Ref Range   Sodium 141 135 - 145 mmol/L   Potassium 4.3 3.5 - 5.1 mmol/L   Chloride 111 98 - 111 mmol/L   CO2 25 22 - 32 mmol/L   Glucose, Bld 99 70 - 99 mg/dL   BUN 23 8 - 23 mg/dL   Creatinine, Ser 8.82 0.61 - 1.24 mg/dL   Calcium  9.2 8.9 - 10.3 mg/dL   GFR, Estimated >39 >39 mL/min   Anion gap 5 5 - 15  CBC     Status: Abnormal   Collection Time: 11/16/23  1:38 PM  Result Value Ref Range   WBC 7.6 4.0 - 10.5 K/uL   RBC 3.82 (L) 4.22 - 5.81 MIL/uL   Hemoglobin 7.0 (L) 13.0 - 17.0 g/dL   HCT 73.7 (L) 60.9 - 47.9 %   MCV 68.6 (L) 80.0 - 100.0 fL   MCH 18.3 (L) 26.0 - 34.0 pg   MCHC 26.7 (L) 30.0 - 36.0 g/dL   RDW 80.3 (H) 88.4 - 84.4 %   Platelets 335 150 - 400 K/uL   nRBC 0.5 (H) 0.0 - 0.2 %  Sample to Blood Bank     Status: None   Collection Time: 11/16/23  1:38 PM  Result Value Ref Range   Blood Bank Specimen SAMPLE AVAILABLE FOR TESTING    Sample Expiration      11/19/2023,2359 Performed at [facility], [address], [place], [place]  [address]   ABO/Rh     Status: None   Collection Time: 11/16/23  1:38 PM  Result Value Ref Range   ABO/RH(D)      O POS Performed at [facility], [address] [place], [place] [address]   Type and screen [facility]  Status: None (Preliminary result)   Collection Time: 11/16/23  1:38 PM  Result Value Ref Range   ABO/RH(D) O POS    Antibody Screen NEG    Sample Expiration 11/19/2023,2359    Unit Number [id number]    Blood Component Type RED CELLS,LR    Unit division 00    Status of Unit ISSUED    Transfusion Status OK TO TRANSFUSE    Crossmatch Result      Compatible Performed at [facility], [address] [place], [place] [address]   Troponin I (High Sensitivity)     Status: None   Collection Time: 11/16/23  6:21 PM  Result Value Ref Range   Troponin I (High Sensitivity) 16 <18 ng/L  Brain natriuretic peptide     Status: Abnormal   Collection Time: 11/16/23  6:21 PM  Result Value Ref Range   B Natriuretic Peptide 290.3 (H) 0.0 - 100.0 pg/mL  Prepare RBC (crossmatch)     Status: None   Collection Time: 11/16/23  6:22 PM  Result Value Ref Range   Order Confirmation      ORDER PROCESSED BY BLOOD BANK Performed at [facility], [address] [place], [place] [address]    DG Chest 1 View Result Date: 11/16/2023 CLINICAL DATA:  Shortness of breath EXAM: CHEST  1 VIEW COMPARISON:  11/16/2023 FINDINGS: Prior CABG. Heart and mediastinal contours within normal limits. Left lung clear. Right basilar opacity is similar to prior study and could reflect atelectasis or infiltrate. Small right pleural effusions suspected. No acute bony abnormality. IMPRESSION: Right basilar airspace opacity likely reflects atelectasis or infiltrate/pneumonia. Small right pleural effusion. Electronically Signed   By: Franky Crease M.D.   On: 11/16/2023 19:25   DG Chest 2 View Result Date: 11/16/2023 CLINICAL DATA:  Shortness of breath. EXAM:  CHEST - 2 VIEW COMPARISON:  01/03/2013. FINDINGS: The heart size and mediastinal contours are within normal limits. Prior median sternotomy and CABG. Streaky bibasilar scarring/subsegmental atelectasis. Suspected small right pleural effusion. No focal consolidation or pneumothorax. Cervical fusion hardware. No acute osseous abnormality. IMPRESSION: 1. Suspected small right pleural effusion. 2.  Streaky bibasilar scarring/subsegmental atelectasis. Electronically Signed   By: Harrietta Sherry M.D.   On: 11/16/2023 10:09     Assessment and Plan: * Acute on chronic blood loss anemia 1 unit of packed red blood cells to be given now Check CBC in the a.m.  Acute combined systolic and diastolic congestive heart failure (HCC) Elevated BNP, and right-sided infiltrate with effusion are consistent with this.  IV Lasix  to be given x 1 and then I continue to p.o. Lasix . Patient has EF of approximately 50% with diastolic dysfunction on last echo from February of this year.  Heme positive stool Patient denies hematemesis, frank rectal bleeding.  He does report dark stools every now and again. Hemoglobin is 7 but vital signs appear stable, more consistent with a chronic blood loss. Consider GI consultation for EGD either as an outpatient or with close outpatient follow-up.  Osteoarthritis of spine with radiculopathy, lumbar region On baclofen  as needed  Chronic kidney disease Creatinine appears stable at 1.31 Avoid nephrotoxic agents Trend  Coronary artery disease involving autologous vein coronary bypass graft without angina pectoris Patient with ST segment depression in the inferolateral leads.  He has had this on prior EKGs but also has a coronary CTA that shows significant stenoses.  He has a normal first troponin with the second pending. Repeat EKG and troponin in the morning.  Hopefully this ischemia 0 longer standing or related to anemia will improve with blood transfusion. Consider cardiology  consult if needed.  Avelina)  Esophageal reflux Changed from p.o. to IV Protonix  given question of upper GI bleed  Hyperlipidemia Continue atorvastatin   Essential hypertension Continue amlodipine       Advance Care Planning:   Code Status: Not on file full, confirmed with patient  Consults: None  Family Communication: Patient at bedside  Severity of Illness: The appropriate patient status for this patient is INPATIENT. Inpatient status is judged to be reasonable and necessary in order to provide the required intensity of service to ensure the patient's safety. The patient's presenting symptoms, physical exam findings, and initial radiographic and laboratory data in the context of their chronic comorbidities is felt to place them at high risk for further clinical deterioration. Furthermore, it is not anticipated that the patient will be medically stable for discharge from the hospital within 2 midnights of admission.   * I certify that at the point of admission it is my clinical judgment that the patient will require inpatient hospital care spanning beyond 2 midnights from the point of admission due to high intensity of service, high risk for further deterioration and high frequency of surveillance required.*  Author: Glenys GORMAN Birk, MD 11/16/2023 8:19 PM  For on call review [email or web address].

## 2023-11-16 NOTE — Assessment & Plan Note (Signed)
 Elevated BNP, and right-sided infiltrate with effusion are consistent with this.  IV Lasix  to be given x 1 and then I continue to p.o. Lasix . Patient has EF of approximately 50% with diastolic dysfunction on last echo from February of this year.

## 2023-11-16 NOTE — Assessment & Plan Note (Signed)
 Continue atorvastatin 

## 2023-11-16 NOTE — Assessment & Plan Note (Addendum)
 Patient with ST segment depression in the inferolateral leads.  He has had this on prior EKGs but also has a coronary CTA that shows significant stenoses.  He has a normal first troponin with the second pending. Repeat EKG and troponin in the morning. Hopefully this ischemia 0 longer standing or related to anemia will improve with blood transfusion. Consider cardiology consult if needed.  ([facility])

## 2023-11-16 NOTE — Progress Notes (Addendum)
 Established Patient Office Visit  Subjective   Patient ID: Matthew Humphrey, male    DOB: Aug 28, 1945  Age: 78 y.o. MRN: [id number]  Chief Complaint  Patient presents with   Medical Management of Chronic Issues   Shortness of Breath    X2-3 months, constant, getting worse, when walking and sitting     Assessment & Plan:   Problem List Items Addressed This Visit       Cardiovascular and Mediastinum   Essential hypertension   Coronary artery disease involving autologous vein coronary bypass graft without angina pectoris     Genitourinary   Chronic kidney disease   Other Visit Diagnoses       Shortness of breath    -  Primary   Relevant Orders   DG Chest 2 View (Completed)   Brain natriuretic peptide (Completed)   CBC with Differential (Completed)   Comprehensive Metabolic Panel (CMET) (Completed)     Acute on chronic heart failure, unspecified heart failure type (HCC)         Shortness of breath workup ordered.  Discussed the patient to follow-up with cardiology at the earliest.  Will do a short trial of Lasix  based on his labs and chest x-ray results.  Patient verbalized understanding.  Will 2 weeks close follow-up. Upon chart review patient is overdue for colonoscopy.  We will discuss about this during next visit. Patient also has CKD.  Will recheck his CMP.  Already has nephrology.  I do not see any upcoming visits.  Discussed with the patient not to take any NSAID medications.  Discontinued his Mobic  today. Discussed lab result with the patient.  Informed patient about severe anemia and CHF exacerbation, possible need for blood transfusion due to severe Anemia.  Informed patient to go to the ER for further management:. Patient verbalized understanding. ER phone call made. Relayed results and need for blood transfusion and possible admission for anemia work up.  No follow-ups on file.   78 year old male with history of coronary artery disease, chronic kidney disease,  GERD, history of ventricular fibrillation, history of pulmonary embolism, hyperlipidemia, hypertension, peptic ulcers, spinal stenosis in cervical region, presents to the clinic due to ongoing shortness of breath for the last 3 months.  Shortness of breath is worse with exertion which improves with rest.  He also notes of getting up in the middle of the night due to shortness of breath.  She also endorses some chest tightness.  Patient follows [facility].  He did not reach out to cardiology but instead decided to come see me.  Patient denies any medication changes or missed medications.  Does endorse increased use of salt.  Patient is new to me today.  His previous primary care physician retired.  Patient unable to provide a good history, his past medical history is taken from his chart review.  Past Surgical History (copied:) He has a past surgical history that includes Coronary artery bypass graft; ICD placement (03/2010); arthrodesis anterior cervicle spine (N/A, 12/11/2012); arthrodesis anterior cervicle spine (N/A, 12/11/2012); instrumentation anterior spine 2 to 3 vertebral segments (N/A, 12/11/2012); and insertion structural bone allograft for spine surgery (N/A, 12/11/2012).   Echo done February 2025. Conclusion occlusion: (Copied) ---------------- MILD LEFT VENTRICULAR SYSTOLIC DYSFUNCTION WITH MODERATE LVH ESTIMATED EF: 50% NORMAL LA PRESSURES WITH DIASTOLIC DYSFUNCTION (GRADE 1) NORMAL RIGHT VENTRICULAR SYSTOLIC FUNCTION VALVULAR REGURGITATION: No AR, MILD MR, No PR, TRIVIAL TR NO VALVULAR STENOSIS    Wt Readings from Last 3 Encounters: 11/16/23 :  187 lb (84.8 kg) 11/01/23 : 187 lb 3.2 oz (84.9 kg) 04/27/23 : 182 lb (82.6 kg)    History of polypectomy: Recommendation: colonoscopy every 5 years.  Overdue for colonoscopy.       Review of Systems  All other systems reviewed and are negative.     Objective:     BP (!) 108/50   Pulse 61   Ht 5' 6 (1.676 m)   Wt 187 lb  (84.8 kg)   SpO2 95%   BMI 30.18 kg/m    Physical Exam Vitals and nursing note reviewed.  Constitutional:      Appearance: Normal appearance.  HENT:     Head: Normocephalic.     Right Ear: External ear normal.     Left Ear: External ear normal.  Eyes:     Conjunctiva/sclera: Conjunctivae normal.  Cardiovascular:     Rate and Rhythm: Normal rate.  Pulmonary:     Effort: Pulmonary effort is normal. No respiratory distress.     Breath sounds: Wheezing present.  Abdominal:     Palpations: Abdomen is soft.  Musculoskeletal:        General: Normal range of motion.  Skin:    General: Skin is warm.  Neurological:     Mental Status: He is alert and oriented to person, place, and time.  Psychiatric:        Mood and Affect: Mood normal.      Results for orders placed or performed in visit on 11/16/23  Brain natriuretic peptide  Result Value Ref Range   B Natriuretic Peptide 356.7 (H) 0.0 - 100.0 pg/mL  CBC with Differential  Result Value Ref Range   WBC 6.8 4.0 - 10.5 K/uL   RBC 3.91 (L) 4.22 - 5.81 MIL/uL   Hemoglobin 7.4 (L) 13.0 - 17.0 g/dL   HCT 73.7 (L) 60.9 - 47.9 %   MCV 67.0 (L) 80.0 - 100.0 fL   MCH 18.9 (L) 26.0 - 34.0 pg   MCHC 28.2 (L) 30.0 - 36.0 g/dL   RDW 79.9 (H) 88.4 - 84.4 %   Platelets 331 150 - 400 K/uL   nRBC 0.4 (H) 0.0 - 0.2 %   Neutrophils Relative % 61 %   Neutro Abs 4.2 1.7 - 7.7 K/uL   Lymphocytes Relative 22 %   Lymphs Abs 1.5 0.7 - 4.0 K/uL   Monocytes Relative 11 %   Monocytes Absolute 0.7 0.1 - 1.0 K/uL   Eosinophils Relative 5 %   Eosinophils Absolute 0.3 0.0 - 0.5 K/uL   Basophils Relative 1 %   Basophils Absolute 0.1 0.0 - 0.1 K/uL   Immature Granulocytes 0 %   Abs Immature Granulocytes 0.03 0.00 - 0.07 K/uL   Abs Granulocyte 4.2 1.5 - 6.5 K/uL  Comprehensive Metabolic Panel (CMET)  Result Value Ref Range   Sodium 140 135 - 145 mmol/L   Potassium 4.0 3.5 - 5.1 mmol/L   Chloride 107 98 - 111 mmol/L   CO2 25 22 - 32 mmol/L    Glucose, Bld 80 70 - 99 mg/dL   BUN 23 8 - 23 mg/dL   Creatinine, Ser 8.68 (H) 0.61 - 1.24 mg/dL   Calcium  9.2 8.9 - 10.3 mg/dL   Total Protein 6.6 6.5 - 8.1 g/dL   Albumin 3.1 (L) 3.5 - 5.0 g/dL   AST 41 15 - 41 U/L   ALT 43 0 - 44 U/L   Alkaline Phosphatase 76 38 - 126 U/L   Total  Bilirubin 0.4 0.0 - 1.2 mg/dL   GFR, Estimated 56 (L) >60 mL/min   Anion gap 8 5 - 15      The 10-year ASCVD risk score (Arnett DK, et al., 2019) is: 24.5%    I personally spent a total of 45 minutes in the care of the patient today including preparing to see the patient, getting/reviewing separately obtained history, performing a medically appropriate exam/evaluation, counseling and educating, placing orders, communicating results, coordinating care, and made a phone call to the ER nurse relaying results and need for blood transfusion and iv lasix .   Vinary K Doyne Micke, MD

## 2023-11-16 NOTE — Hospital Course (Signed)
 Patient is a 78 year old with history of coronary artery disease status post CABG 2012, HTN, HLD, GERD, CKD who reports several month history of increasing shortness of breath, chest pain, dark stools.  He was seen by his primary care physician today in the office and was noted to have low hemoglobin at 7 and an elevated BNP and was sent to the ED.  These measurements were confirmed in the ED.  The patient was noted to have heme positive stools.  Chest x-ray revealed a right basilar opacity which was a question of infiltrate versus pneumonia and small right pleural effusion.  Patient reports lower extremity swelling as well.  Of note the patient had a coronary CT in March of this year showing RCA with greater than 70% stenosis, LAD with greater than 70% stenosis, left circumflex with greater than 70% stenosis.  Patient is not had follow-up since then.  Patient was supposed to have colonoscopy approximately 3 weeks ago he missed this.  Previous colonoscopy was done in 2019 and was essentially normal.  Patient was noted to have inferolateral ST depression on EKG.  This is thought to be related to anemia.

## 2023-11-17 ENCOUNTER — Inpatient Hospital Stay
Admit: 2023-11-17 | Discharge: 2023-11-17 | Disposition: A | Discharge status: Home / Self Care | Attending: Student | Admitting: Student

## 2023-11-17 ENCOUNTER — Encounter: Payer: Self-pay | Admitting: Family Medicine

## 2023-11-17 DIAGNOSIS — D62 Acute posthemorrhagic anemia: Secondary | ICD-10-CM

## 2023-11-17 LAB — BPAM RBC
Blood Product Expiration Date: 202509172359
ISSUE DATE / TIME: 202508152012
Unit Type and Rh: 5100

## 2023-11-17 LAB — TYPE AND SCREEN
ABO/RH(D): O POS
Antibody Screen: NEGATIVE
Unit division: 0

## 2023-11-17 LAB — ECHOCARDIOGRAM COMPLETE
AR max vel: 2.17 cm2
AV Area VTI: 2.72 cm2
AV Area mean vel: 2.28 cm2
AV Mean grad: 2 mmHg
AV Peak grad: 4.8 mmHg
Ao pk vel: 1.09 m/s
Area-P 1/2: 4.77 cm2
Calc EF: 47.2 %
Height: 66 in
MV VTI: 1.84 cm2
S' Lateral: 3.2 cm
Single Plane A2C EF: 46.2 %
Single Plane A4C EF: 47.5 %
Weight: 2880 [oz_av]

## 2023-11-17 LAB — CBC
HCT: 30.7 % — ABNORMAL LOW (ref 39.0–52.0)
Hemoglobin: 8.5 g/dL — ABNORMAL LOW (ref 13.0–17.0)
MCH: 19.3 pg — ABNORMAL LOW (ref 26.0–34.0)
MCHC: 27.7 g/dL — ABNORMAL LOW (ref 30.0–36.0)
MCV: 69.6 fL — ABNORMAL LOW (ref 80.0–100.0)
Platelets: 283 K/uL (ref 150–400)
RBC: 4.41 MIL/uL (ref 4.22–5.81)
RDW: 21.5 % — ABNORMAL HIGH (ref 11.5–15.5)
WBC: 6 K/uL (ref 4.0–10.5)
nRBC: 0.3 % — ABNORMAL HIGH (ref 0.0–0.2)

## 2023-11-17 LAB — FERRITIN: Ferritin: 8 ng/mL — ABNORMAL LOW (ref 24–336)

## 2023-11-17 MED ORDER — PEG 3350-KCL-NA BICARB-NACL 420 G PO SOLR
4000.0000 mL | Freq: Once | ORAL | Status: AC
  Administered 2023-11-17: 4000 mL via ORAL
  Filled 2023-11-17: qty 4000

## 2023-11-17 MED ORDER — PEG 3350-KCL-NA BICARB-NACL 420 G PO SOLR: 2000.0000 mL | Freq: Once | ORAL | Status: DC | PRN

## 2023-11-17 MED ORDER — FUROSEMIDE 10 MG/ML IJ SOLN
40.0000 mg | Freq: Once | INTRAMUSCULAR | Status: AC
  Administered 2023-11-17: 40 mg via INTRAVENOUS
  Filled 2023-11-17: qty 4

## 2023-11-17 NOTE — Progress Notes (Signed)
*  PRELIMINARY RESULTS* Echocardiogram 2D Echocardiogram has been performed.  Matthew Humphrey Matthew Humphrey 11/17/2023, 12:42 PM

## 2023-11-17 NOTE — ED Notes (Signed)
 Nurse called for labs. Called gaby at [phone] to inform.

## 2023-11-17 NOTE — Consult Note (Signed)
 Inpatient Consultation   Patient ID: Matthew Humphrey is a 78 y.o. male.  Requesting Provider: Devaughn Ban, MD  Date of Admission: 11/16/2023  Date of Consult: 11/17/23   Reason for Consultation: symptomatic anemia   Patient's Chief Complaint:   Chief Complaint  Patient presents with   Shortness of Breath    78 year old African-American male with history of CAD status post CABG 2012, CKD, GERD, hyperlipidemia, hypertension and osteoarthritis who presents to the hospital with worsening chest pain and shortness of breath.  Gastroenterology is consulted for symptomatic anemia.  Patient has reported dark stools intermittently at home.  He is on Mobic  up until yesterday when it was stopped by his PCP.  Was additionally taking baby aspirin .  He also had shortness of breath and chest pain.  Hemoglobin was found to be 7.4.  This improved to 8.5 with 1 unit PRBCs.  Shortness of breath was mostly with exertion, but did wake him up at times at night.  BNP elevated on arrival over 300.  Underwent cardiac workup in February with grade 1 diastolic dysfunction and ejection fraction 50%.  Notably had some CAD and bypass vessels on CT earlier this year.  Cardiology is evaluating here in the hospital.  Using Mobic  and baby aspirin  at home.  No PPI use Denies anticoagulants Denies family history of gastrointestinal disease and malignancy Previous Endoscopies: 07/2017- [facility]-diverticulosis, and external hemorrhoids, skin tag     Past Medical History:  Diagnosis Date   Arthritis    knee   Chronic kidney disease    GERD (gastroesophageal reflux disease)    Hyperlipidemia    Hypertension     Past Surgical History:  Procedure Laterality Date   CARDIAC SURGERY  03/2010   bypass   CERVICAL FUSION  12/11/2012   C4-5,C5-6,C6-7   COLONOSCOPY  2011 ?   COLONOSCOPY WITH PROPOFOL  N/A 07/11/2017   Procedure: COLONOSCOPY WITH PROPOFOL ;  Surgeon: Unk Corinn Skiff, MD;  Location: [facility];   Service: Endoscopy;  Laterality: N/A;   POLYPECTOMY N/A 07/11/2017   Procedure: POLYPECTOMY;  Surgeon: Unk Corinn Skiff, MD;  Location: [facility];  Service: Endoscopy;  Laterality: N/A;    Allergies  Allergen Reactions   Sulfa Antibiotics Other (See Comments)   Sulfamethoxazole-Trimethoprim Other (See Comments)    Other reaction(s): Unknown Other reaction(s): Unknown    Sulfasalazine Other (See Comments)    Family History  Problem Relation Age of Onset   Hypertension Father    Hypotension Mother     Social History   Tobacco Use   Smoking status: Former    Current packs/day: 0.00    Average packs/day: 1.5 packs/day for 30.0 years (45.0 ttl pk-yrs)    Types: Cigarettes    Start date: 79    Quit date: 1997    Years since quitting: 28.6   Smokeless tobacco: Former    Types: Chew   Tobacco comments:    smoking cessation materials not required  Vaping Use   Vaping status: Never Used  Substance Use Topics   Alcohol use: No    Alcohol/week: 0.0 standard drinks of alcohol   Drug use: No     Pertinent GI related history and allergies were reviewed with the patient  Review of Systems  Constitutional:  Positive for activity change. Negative for appetite change, chills, diaphoresis, fatigue, fever and unexpected weight change.  HENT:  Negative for trouble swallowing and voice change.   Respiratory:  Positive for chest tightness and shortness of  breath. Negative for wheezing.   Cardiovascular:  Positive for chest pain and leg swelling. Negative for palpitations.  Gastrointestinal:  Positive for blood in stool (melena). Negative for abdominal distention, abdominal pain, anal bleeding, constipation, diarrhea, nausea and vomiting.  Musculoskeletal:  Negative for arthralgias and myalgias.  Skin:  Negative for color change and pallor.  Neurological:  Negative for dizziness, syncope and weakness.  Psychiatric/Behavioral:  Negative for confusion. The patient is not  nervous/anxious.   All other systems reviewed and are negative.    Medications Home Medications No current facility-administered medications on file prior to encounter.   Current Outpatient Medications on File Prior to Encounter  Medication Sig Dispense Refill   amLODipine  (NORVASC ) 5 MG tablet Take 5 mg by mouth daily.     aspirin  EC 81 MG tablet Take 1 tablet (81 mg total) by mouth daily. 90 tablet 3   atorvastatin  (LIPITOR ) 80 MG tablet TAKE 1 TABLET BY MOUTH ONCE  DAILY 90 tablet 3   Baclofen  5 MG TABS Take 1 tablet (5 mg total) by mouth 3 (three) times daily as needed. 30 tablet 0   isosorbide  mononitrate (IMDUR ) 30 MG 24 hr tablet Take 30 mg by mouth daily.     metoprolol  tartrate (LOPRESSOR ) 25 MG tablet Take 0.5 tablets (12.5 mg total) by mouth 2 (two) times daily. (Patient taking differently: Take 25 mg by mouth 2 (two) times daily.) 90 tablet 1   nitroGLYCERIN  (NITROSTAT ) 0.4 MG SL tablet Place 1 tablet (0.4 mg total) under the tongue every 5 (five) minutes as needed for chest pain. 50 tablet 3   pantoprazole  (PROTONIX ) 40 MG tablet TAKE 1 TABLET BY MOUTH DAILY 90 tablet 3   Pertinent GI related medications were reviewed with the patient  Inpatient Medications  Current Facility-Administered Medications:    acetaminophen  (TYLENOL ) tablet 650 mg, 650 mg, Oral, Q4H PRN, Fredirick Glenys RAMAN, MD   amLODipine  (NORVASC ) tablet 5 mg, 5 mg, Oral, Daily, Fredirick Glenys RAMAN, MD   atorvastatin  (LIPITOR ) tablet 80 mg, 80 mg, Oral, Daily, Fredirick Glenys RAMAN, MD   baclofen  (LIORESAL ) tablet 5 mg, 5 mg, Oral, TID PRN, Fredirick Glenys RAMAN, MD   isosorbide  mononitrate (IMDUR ) 24 hr tablet 30 mg, 30 mg, Oral, Daily, Pratt, Tanya S, MD   metoprolol  tartrate (LOPRESSOR ) tablet 12.5 mg, 12.5 mg, Oral, BID, Pratt, Tanya S, MD, 12.5 mg at 11/16/23 2152   nitroGLYCERIN  (NITROSTAT ) SL tablet 0.4 mg, 0.4 mg, Sublingual, Q5 min PRN, Fredirick Glenys RAMAN, MD   ondansetron  (ZOFRAN ) injection 4 mg, 4 mg, Intravenous, Q6H PRN,  Fredirick Glenys RAMAN, MD   pantoprazole  (PROTONIX ) injection 40 mg, 40 mg, Intravenous, Q12H, Fredirick Glenys RAMAN, MD  Current Outpatient Medications:    amLODipine  (NORVASC ) 5 MG tablet, Take 5 mg by mouth daily., Disp: , Rfl:    aspirin  EC 81 MG tablet, Take 1 tablet (81 mg total) by mouth daily., Disp: 90 tablet, Rfl: 3   atorvastatin  (LIPITOR ) 80 MG tablet, TAKE 1 TABLET BY MOUTH ONCE  DAILY, Disp: 90 tablet, Rfl: 3   Baclofen  5 MG TABS, Take 1 tablet (5 mg total) by mouth 3 (three) times daily as needed., Disp: 30 tablet, Rfl: 0   isosorbide  mononitrate (IMDUR ) 30 MG 24 hr tablet, Take 30 mg by mouth daily., Disp: , Rfl:    metoprolol  tartrate (LOPRESSOR ) 25 MG tablet, Take 0.5 tablets (12.5 mg total) by mouth 2 (two) times daily. (Patient taking differently: Take 25 mg by mouth 2 (two) times daily.),  Disp: 90 tablet, Rfl: 1   nitroGLYCERIN  (NITROSTAT ) 0.4 MG SL tablet, Place 1 tablet (0.4 mg total) under the tongue every 5 (five) minutes as needed for chest pain., Disp: 50 tablet, Rfl: 3   pantoprazole  (PROTONIX ) 40 MG tablet, TAKE 1 TABLET BY MOUTH DAILY, Disp: 90 tablet, Rfl: 3   acetaminophen , baclofen , nitroGLYCERIN , ondansetron  (ZOFRAN ) IV   Objective   Vitals:   11/17/23 0700 11/17/23 0800 11/17/23 0815 11/17/23 0830  BP: (!) 153/71 (!) 160/78 (!) 163/69 (!) 140/72  Pulse: 62 74 (!) 56 70  Resp: 19 (!) 23 19 (!) 30  Temp:   98.4 F (36.9 C)   TempSrc:   Oral   SpO2: 99% 98% 98% 98%  Weight:      Height:         Physical Exam Vitals and nursing note reviewed.  Constitutional:      General: He is not in acute distress.    Appearance: He is not ill-appearing, toxic-appearing or diaphoretic.  HENT:     Head: Normocephalic and atraumatic.     Nose: Nose normal.     Mouth/Throat:     Mouth: Mucous membranes are moist.     Pharynx: Oropharynx is clear.  Eyes:     General: No scleral icterus.    Extraocular Movements: Extraocular movements intact.  Cardiovascular:     Rate and  Rhythm: Normal rate and regular rhythm.     Heart sounds: Normal heart sounds. No murmur heard.    No friction rub. No gallop.  Pulmonary:     Effort: Pulmonary effort is normal. No respiratory distress.     Breath sounds: Normal breath sounds. No wheezing, rhonchi or rales.  Abdominal:     General: Bowel sounds are normal. There is no distension.     Palpations: Abdomen is soft.     Tenderness: There is no abdominal tenderness. There is no guarding or rebound.  Musculoskeletal:     Cervical back: Neck supple.     Right lower leg: Edema present.     Left lower leg: Edema present.  Skin:    General: Skin is warm and dry.     Coloration: Skin is not jaundiced or pale.  Neurological:     General: No focal deficit present.     Mental Status: He is alert and oriented to person, place, and time. Mental status is at baseline.  Psychiatric:        Mood and Affect: Mood normal.        Behavior: Behavior normal.        Thought Content: Thought content normal.        Judgment: Judgment normal.     Laboratory Data Recent Labs  Lab 11/16/23 0923 11/16/23 1338 11/16/23 2229  WBC 6.8 7.6 7.5  HGB 7.4* 7.0* 8.4*  HCT 26.2* 26.2* 29.5*  PLT 331 335 298   Recent Labs  Lab 11/16/23 0923 11/16/23 1338 11/16/23 2229  NA 140 141 140  K 4.0 4.3 3.9  CL 107 111 109  CO2 25 25 24   BUN 23 23 21   CALCIUM  9.2 9.2 9.2  PROT 6.6  --   --   BILITOT 0.4  --   --   ALKPHOS 76  --   --   ALT 43  --   --   AST 41  --   --   GLUCOSE 80 99 91   No results for input(s): INR in the last 168 hours.  No results for input(s): LIPASE in the last 72 hours.      Imaging Studies: DG Chest 1 View Result Date: 11/16/2023 CLINICAL DATA:  Shortness of breath EXAM: CHEST  1 VIEW COMPARISON:  11/16/2023 FINDINGS: Prior CABG. Heart and mediastinal contours within normal limits. Left lung clear. Right basilar opacity is similar to prior study and could reflect atelectasis or infiltrate. Small right  pleural effusions suspected. No acute bony abnormality. IMPRESSION: Right basilar airspace opacity likely reflects atelectasis or infiltrate/pneumonia. Small right pleural effusion. Electronically Signed   By: Franky Crease M.D.   On: 11/16/2023 19:25   DG Chest 2 View Result Date: 11/16/2023 CLINICAL DATA:  Shortness of breath. EXAM: CHEST - 2 VIEW COMPARISON:  01/03/2013. FINDINGS: The heart size and mediastinal contours are within normal limits. Prior median sternotomy and CABG. Streaky bibasilar scarring/subsegmental atelectasis. Suspected small right pleural effusion. No focal consolidation or pneumothorax. Cervical fusion hardware. No acute osseous abnormality. IMPRESSION: 1. Suspected small right pleural effusion. 2.  Streaky bibasilar scarring/subsegmental atelectasis. Electronically Signed   By: Harrietta Sherry M.D.   On: 11/16/2023 10:09    Assessment:   # Symptomatic anemia; ABLA  # Melena - Suspect upper GI bleeding source given recent NSAID and aspirin  use without PPI use - Hemoglobin on presentation was 7.4 MCV 67.  - This improved with 1 unit PRBC this hospitalization -BUN/creatinine ratio not elevated -No hematemesis or coffee-ground emesis nor hematochezia  #HFpEF-most recent EF 50% with grade 1 diastolic dysfunction  #Dyspnea on exertion along with chest pain #CAD status post CABG 2012 #Hypertension #Hyperlipidemia #C-spine surgery   Plan:   Esophagogastroduodenoscopy and Colonoscopy planned for tomorrow pending patient stability, cardiology review/risk stratification and endoscopy suite availability NPO at midnight Clear liquids now  If cardiology allows for bidirectional endoscopy, will begin GoLytely prep at 1600.  Additional as needed gallon will be available if stools not clear after first.  Labs in am- bmp, cbc, inr Protonix  40 mg iv q12 h Hold dvt ppx Monitor H&H.  Transfusion and resuscitation as per primary team Avoid frequent lab draws to prevent lab  induced anemia Supportive care and antiemetics as per primary team Maintain two sites IV access Avoid nsaids Monitor for GIB.  Esophagogastroduodenoscopy and Colonoscopy with possible biopsy, control of bleeding, polypectomy, and interventions as necessary has been discussed with the patient/patient representative. Informed consent was obtained from the patient/patient representative after explaining the indication, nature, and risks of the procedure including but not limited to death, bleeding, perforation, missed neoplasm/lesions, cardiorespiratory compromise, and reaction to medications. Opportunity for questions was given and appropriate answers were provided. Patient/patient representative has verbalized understanding is amenable to undergoing the procedure.   Management of other medical comorbidities as per primary team  I personally performed the service.  Thank you for allowing us  to participate in this patient's care. Please don't hesitate to call if any questions or concerns arise.   Elspeth Ozell Jungling, DO [facility]  Portions of the record may have been created with voice recognition software. Occasional wrong-word or 'sound-a-like' substitutions may have occurred due to the inherent limitations of voice recognition software.  Read the chart carefully and recognize, using context, where substitutions may have occurred.

## 2023-11-17 NOTE — Progress Notes (Signed)
 PROGRESS NOTE    Matthew Humphrey  FMW:[id number] DOB: 02-01-1946 DOA: 11/16/2023 PCP: Kotturi, Vinay K, MD  Outpatient Specialists: cardiology    Brief Narrative:   Matthew Humphrey is a 78 y.o. male with medical history significant of coronary artery disease status post CABG 2012, HTN, HLD, GERD, CKD who reports several month history of increasing shortness of breath, chest pain, dark stools.  He was seen by his primary care physician today in the office and was noted to have low hemoglobin at 7 and an elevated BNP and was sent to the ED.  These measurements were confirmed in the ED.  The patient was noted to have heme positive stools.  Chest x-ray revealed a right basilar opacity which was a question of infiltrate versus pneumonia and small right pleural effusion.  Patient reports lower extremity swelling as well.  Of note the patient had a coronary CT in March of this year showing RCA with greater than 70% stenosis, LAD with greater than 70% stenosis, left circumflex with greater than 70% stenosis.  Patient is not had follow-up since then.  Patient was supposed to have colonoscopy approximately 3 weeks ago he missed this.  Previous colonoscopy was done in 2019 and was essentially normal.  Patient was noted to have inferolateral ST depression on EKG.  This is thought to be related to anemia.    Assessment & Plan:   Principal Problem:   Acute on chronic blood loss anemia Active Problems:   Essential hypertension   Hyperlipidemia   Esophageal reflux   Coronary artery disease involving autologous vein coronary bypass graft without angina pectoris   Osteoarthritis of spine with radiculopathy, lumbar region   Heme positive stool   Acute combined systolic and diastolic congestive heart failure (HCC)  # Microcytic anemia No recent hgb for comparison but several months progressive exertional dyspnea, hgb in the 7s, microcytosis, intermittent black stools, suspect slow GI bleed. Transfused 1  unit on 8/15 with appropriate rise. Unfortunately a diet was ordered and patient ate this morning - continue to trend hgb, hgb goal above 8 given CAD - discontinue asa and lovenox  - continue IV ppi - clears for today - will discuss endoluminal eval with GI  # Exertional dyspnea # Orthopnea # CAD # Hx CABG With known CAD, hhx cabg x2 in 2012, non-diagnostic recent stress. Bnp elevated, ef in February of this year was 50%. Trop here 16>19, do not think ACS. Anemia likely contributing - TTE - cardiology consult - tele - hold home asa given concern for GI bleed - cont home statin - transfuse to maintain hgb above 8  # HFpEF Exertional dyspnea as above, likely exacerbated by anemia - cont home imdur  - cont home metop, does not appear to be decompensated - TTE - lasix  40 IV once  # HTN Here bp moderately elevated - cont home amlodipine , metop   DVT prophylaxis: SCDs Code Status: full Family Communication: girlfriend updated @ bedside 8/16  Level of care: Telemetry Cardiac Status is: Inpatient Remains inpatient appropriate because: severity of illness    Consultants:  Cardiology GI  Procedures: None thus far  Antimicrobials:  none    Subjective: Reports feeling fine  Objective: Vitals:   11/17/23 0625 11/17/23 0700 11/17/23 0800 11/17/23 0815  BP:  (!) 153/71 (!) 160/78 (!) 163/69  Pulse:  62 74 (!) 56  Resp:  19 (!) 23 19  Temp: 98.5 F (36.9 C)   98.4 F (36.9 C)  TempSrc: Oral  Oral  SpO2:  99% 98% 98%  Weight:      Height:       No intake or output data in the 24 hours ending 11/17/23 0832 Filed Weights   11/16/23 1334  Weight: 81.6 kg    Examination:  General exam: Appears calm and comfortable  Respiratory system: Clear to auscultation. Respiratory effort normal. Cardiovascular system: S1 & S2 heard, RRR.   Gastrointestinal system: Abdomen is nondistended, soft and nontender. No organomegaly or masses felt.   Central nervous system:  Alert and oriented. No focal neurological deficits. Extremities: Symmetric 5 x 5 power. Skin: No rashes, lesions or ulcers Psychiatry: Judgement and insight appear normal. Mood & affect appropriate.     Data Reviewed: I have personally reviewed following labs and imaging studies  CBC: Recent Labs  Lab 11/16/23 0923 11/16/23 1338 11/16/23 2229  WBC 6.8 7.6 7.5  NEUTROABS 4.2  --   --   HGB 7.4* 7.0* 8.4*  HCT 26.2* 26.2* 29.5*  MCV 67.0* 68.6* 70.4*  PLT 331 335 298   Basic Metabolic Panel: Recent Labs  Lab 11/16/23 0923 11/16/23 1338 11/16/23 2229  NA 140 141 140  K 4.0 4.3 3.9  CL 107 111 109  CO2 25 25 24   GLUCOSE 80 99 91  BUN 23 23 21   CREATININE 1.31* 1.17 1.17  CALCIUM  9.2 9.2 9.2   GFR: Estimated Creatinine Clearance: 53 mL/min (by C-G formula based on SCr of 1.17 mg/dL). Liver Function Tests: Recent Labs  Lab 11/16/23 0923  AST 41  ALT 43  ALKPHOS 76  BILITOT 0.4  PROT 6.6  ALBUMIN 3.1*   No results for input(s): LIPASE, AMYLASE in the last 168 hours. No results for input(s): AMMONIA in the last 168 hours. Coagulation Profile: No results for input(s): INR, PROTIME in the last 168 hours. Cardiac Enzymes: No results for input(s): CKTOTAL, CKMB, CKMBINDEX, TROPONINI in the last 168 hours. BNP (last 3 results) No results for input(s): PROBNP in the last 8760 hours. HbA1C: No results for input(s): HGBA1C in the last 72 hours. CBG: No results for input(s): GLUCAP in the last 168 hours. Lipid Profile: No results for input(s): CHOL, HDL, LDLCALC, TRIG, CHOLHDL, LDLDIRECT in the last 72 hours. Thyroid Function Tests: No results for input(s): TSH, T4TOTAL, FREET4, T3FREE, THYROIDAB in the last 72 hours. Anemia Panel: No results for input(s): VITAMINB12, FOLATE, FERRITIN, TIBC, IRON , RETICCTPCT in the last 72 hours. Urine analysis: No results found for: COLORURINE, APPEARANCEUR, LABSPEC,  PHURINE, GLUCOSEU, HGBUR, BILIRUBINUR, KETONESUR, PROTEINUR, UROBILINOGEN, NITRITE, LEUKOCYTESUR Sepsis Labs: @LABRCNTIP (procalcitonin:4,lacticidven:4)  )No results found for this or any previous visit (from the past 240 hours).       Radiology Studies: DG Chest 1 View Result Date: 11/16/2023 CLINICAL DATA:  Shortness of breath EXAM: CHEST  1 VIEW COMPARISON:  11/16/2023 FINDINGS: Prior CABG. Heart and mediastinal contours within normal limits. Left lung clear. Right basilar opacity is similar to prior study and could reflect atelectasis or infiltrate. Small right pleural effusions suspected. No acute bony abnormality. IMPRESSION: Right basilar airspace opacity likely reflects atelectasis or infiltrate/pneumonia. Small right pleural effusion. Electronically Signed   By: Franky Crease M.D.   On: 11/16/2023 19:25   DG Chest 2 View Result Date: 11/16/2023 CLINICAL DATA:  Shortness of breath. EXAM: CHEST - 2 VIEW COMPARISON:  01/03/2013. FINDINGS: The heart size and mediastinal contours are within normal limits. Prior median sternotomy and CABG. Streaky bibasilar scarring/subsegmental atelectasis. Suspected small right pleural effusion. No focal consolidation or  pneumothorax. Cervical fusion hardware. No acute osseous abnormality. IMPRESSION: 1. Suspected small right pleural effusion. 2.  Streaky bibasilar scarring/subsegmental atelectasis. Electronically Signed   By: Harrietta Sherry M.D.   On: 11/16/2023 10:09        Scheduled Meds:  amLODipine   5 mg Oral Daily   aspirin  EC  81 mg Oral Daily   atorvastatin   80 mg Oral Daily   enoxaparin  (LOVENOX ) injection  40 mg Subcutaneous Q24H   isosorbide  mononitrate  30 mg Oral Daily   metoprolol  tartrate  12.5 mg Oral BID   pantoprazole  (PROTONIX ) IV  40 mg Intravenous Q12H   Continuous Infusions:   LOS: 1 day     Devaughn KATHEE Ban, MD [facility]   If 7PM-7AM, please contact night-coverage [email or web address] Password  [facility] 11/17/2023, 8:32 AM

## 2023-11-17 NOTE — Consult Note (Signed)
 CARDIOLOGY CONSULT NOTE               Patient ID: Matthew Humphrey MRN: [id number] DOB/AGE: 1946-02-28 78 y.o.  Admit date: 11/16/2023 Referring Physician Dr Devaughn [facility] hospitalist Primary Physician Dr Mackey Ny primary Primary Cardiologist [facility] cardiology Reason for Consultation chest discomfort and anemia fatigue shortness of breath  HPI: 78 year old presents with coronary disease history of coronary bypass surgery in 2012 hypertension hyperlipidemia GERD chronic renal sufficiency presents with several month history of shortness of breath chest discomfort dark-colored stools.  Primary physician did laboratories and found that his hemoglobin was 7 and advised him to come to emergency room.  Patient had abnormal chest x-ray CTA done in March suggested three-vessel coronary disease no mention of bypass graft patient EKG was nondiagnostic states that his symptoms been chronic here for follow-up evaluation and management  Review of systems complete and found to be negative unless listed above     Past Medical History:  Diagnosis Date   Arthritis    knee   Chronic kidney disease    GERD (gastroesophageal reflux disease)    Hyperlipidemia    Hypertension     Past Surgical History:  Procedure Laterality Date   CARDIAC SURGERY  03/2010   bypass   CERVICAL FUSION  12/11/2012   C4-5,C5-6,C6-7   COLONOSCOPY  2011 ?   COLONOSCOPY WITH PROPOFOL  N/A 07/11/2017   Procedure: COLONOSCOPY WITH PROPOFOL ;  Surgeon: Unk Corinn Skiff, MD;  Location: [facility];  Service: Endoscopy;  Laterality: N/A;   POLYPECTOMY N/A 07/11/2017   Procedure: POLYPECTOMY;  Surgeon: Unk Corinn Skiff, MD;  Location[facility];  Service: Endoscopy;  Laterality: N/A;    Medications Prior to Admission  Medication Sig Dispense Refill Last Dose/Taking   amLODipine  (NORVASC ) 5 MG tablet Take 5 mg by mouth daily.   11/16/2023   aspirin  EC 81 MG tablet Take 1 tablet (81 mg total) by mouth  daily. 90 tablet 3 11/16/2023   atorvastatin  (LIPITOR ) 80 MG tablet TAKE 1 TABLET BY MOUTH ONCE  DAILY 90 tablet 3 11/16/2023   Baclofen  5 MG TABS Take 1 tablet (5 mg total) by mouth 3 (three) times daily as needed. 30 tablet 0 11/16/2023   isosorbide  mononitrate (IMDUR ) 30 MG 24 hr tablet Take 30 mg by mouth daily.   11/16/2023   metoprolol  tartrate (LOPRESSOR ) 25 MG tablet Take 0.5 tablets (12.5 mg total) by mouth 2 (two) times daily. (Patient taking differently: Take 25 mg by mouth 2 (two) times daily.) 90 tablet 1 11/16/2023   nitroGLYCERIN  (NITROSTAT ) 0.4 MG SL tablet Place 1 tablet (0.4 mg total) under the tongue every 5 (five) minutes as needed for chest pain. 50 tablet 3 Unknown   pantoprazole  (PROTONIX ) 40 MG tablet TAKE 1 TABLET BY MOUTH DAILY 90 tablet 3 11/16/2023   Social History   Socioeconomic History   Marital status: Single    Spouse name: Not on file   Number of children: 3   Years of education: Not on file   Highest education level: 9th grade  Occupational History   Occupation: Retired  Tobacco Use   Smoking status: Former    Current packs/day: 0.00    Average packs/day: 1.5 packs/day for 30.0 years (45.0 ttl pk-yrs)    Types: Cigarettes    Start date: 66    Quit date: 1997    Years since quitting: 28.6   Smokeless tobacco: Former    Types: Chew   Tobacco comments:  smoking cessation materials not required  Vaping Use   Vaping status: Never Used  Substance and Sexual Activity   Alcohol use: No    Alcohol/week: 0.0 standard drinks of alcohol   Drug use: No   Sexual activity: Not Currently  Other Topics Concern   Not on file  Social History Narrative   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, lives alone   Social Drivers of Corporate investment banker Strain: Low Risk  (02/01/2022)   Overall Financial Resource Strain (CARDIA)    Difficulty of Paying Living Expenses: Not hard at all  Food Insecurity: No Food Insecurity (11/17/2023)   Hunger Vital Sign     Worried About Running Out of Food in the Last Year: Never true    Ran Out of Food in the Last Year: Never true  Transportation Needs: No Transportation Needs (11/17/2023)   PRAPARE - Administrator, Civil Service (Medical): No    Lack of Transportation (Non-Medical): No  Physical Activity: Inactive (06/21/2020)   Exercise Vital Sign    Days of Exercise per Week: 0 days    Minutes of Exercise per Session: 0 min  Stress: No Stress Concern Present (02/01/2022)   [employer] of Occupational Health - Occupational Stress Questionnaire    Feeling of Stress : Not at all  Social Connections: Socially Isolated (11/17/2023)   Social Connection and Isolation Panel    Frequency of Communication with Friends and Family: Three times a week    Frequency of Social Gatherings with Friends and Family: Once a week    Attends Religious Services: Never    Database administrator or Organizations: No    Attends Banker Meetings: Never    Marital Status: Divorced  Catering manager Violence: Not At Risk (11/17/2023)   Humiliation, Afraid, Rape, and Kick questionnaire    Fear of Current or Ex-Partner: No    Emotionally Abused: No    Physically Abused: No    Sexually Abused: No    Family History  Problem Relation Age of Onset   Hypertension Father    Hypotension Mother       Review of systems complete and found to be negative unless listed above      PHYSICAL EXAM  General: Well developed, well nourished, in no acute distress HEENT:  Normocephalic and atramatic Neck:  No JVD.  Lungs: Clear bilaterally to auscultation and percussion. Heart: HRRR . Normal S1 and S2 without gallops or murmurs.  Abdomen: Bowel sounds are positive, abdomen soft and non-tender  Msk:  Back normal, normal gait. Normal strength and tone for age. Extremities: No clubbing, cyanosis or edema.   Neuro: Alert and oriented X 3. Psych:  Good affect, responds appropriately  Labs:   Lab Results   Component Value Date   WBC 6.0 11/17/2023   HGB 8.5 (L) 11/17/2023   HCT 30.7 (L) 11/17/2023   MCV 69.6 (L) 11/17/2023   PLT 283 11/17/2023    Recent Labs  Lab 11/16/23 0923 11/16/23 1338 11/16/23 2229  NA 140   < > 140  K 4.0   < > 3.9  CL 107   < > 109  CO2 25   < > 24  BUN 23   < > 21  CREATININE 1.31*   < > 1.17  CALCIUM  9.2   < > 9.2  PROT 6.6  --   --   BILITOT 0.4  --   --  ALKPHOS 76  --   --   ALT 43  --   --   AST 41  --   --   GLUCOSE 80   < > 91   < > = values in this interval not displayed.   No results found for: CKTOTAL, CKMB, CKMBINDEX, TROPONINI  Lab Results  Component Value Date   CHOL 167 05/19/2022   CHOL 139 05/19/2021   CHOL 223 (H) 08/17/2020   Lab Results  Component Value Date   HDL 49 05/19/2022   HDL 51 05/19/2021   HDL 51 08/17/2020   Lab Results  Component Value Date   LDLCALC 104 (H) 05/19/2022   LDLCALC 75 05/19/2021   LDLCALC 154 (H) 08/17/2020   Lab Results  Component Value Date   TRIG 75 05/19/2022   TRIG 62 05/19/2021   TRIG 99 08/17/2020   Lab Results  Component Value Date   CHOLHDL 3.9 03/12/2018   CHOLHDL 3.5 09/04/2017   CHOLHDL 2.7 12/15/2015   No results found for: LDLDIRECT    Radiology: ECHOCARDIOGRAM COMPLETE Result Date: 11/17/2023    ECHOCARDIOGRAM REPORT   Patient Name:   LEONA PRESSLY Date of Exam: 11/17/2023 Medical Rec #:  [id number]          Height:       66.0 in Accession #:    [id number]         Weight:       180.0 lb Date of Birth:  Apr 29, 1945           BSA:          1.912 m Patient Age:    78 years           BP:           129/64 mmHg Patient Gender: M                  HR:           57 bpm. Exam Location:  [facility] Procedure: 2D Echo, Cardiac Doppler and Color Doppler (Both Spectral and Color            Flow Doppler were utilized during procedure). Indications:     Dyspnea R06.00  History:         Patient has prior history of Echocardiogram examinations. Risk                   Factors:Hypertension.  Sonographer:     Bari Roar Referring Phys:  [id number] CARALYN HUDSON Diagnosing Phys: Cara JONETTA Lovelace MD IMPRESSIONS  1. Inferior posterior septal hypo.  2. Left ventricular ejection fraction, by estimation, is 45 to 50%. The left ventricle has mildly decreased function. The left ventricle demonstrates regional wall motion abnormalities (see scoring diagram/findings for description). Left ventricular diastolic parameters are consistent with Grade I diastolic dysfunction (impaired relaxation).  3. Right ventricular systolic function is normal. The right ventricular size is normal.  4. The mitral valve is normal in structure. Mild mitral valve regurgitation.  5. The aortic valve is normal in structure. Aortic valve regurgitation is not visualized. FINDINGS  Left Ventricle: Left ventricular ejection fraction, by estimation, is 45 to 50%. The left ventricle has mildly decreased function. The left ventricle demonstrates regional wall motion abnormalities. Strain was performed and the global longitudinal strain is indeterminate. The left ventricular internal cavity size was normal in size. There is borderline concentric left ventricular hypertrophy. Left ventricular diastolic parameters are consistent with Grade I diastolic dysfunction (  impaired relaxation). Right Ventricle: The right ventricular size is normal. No increase in right ventricular wall thickness. Right ventricular systolic function is normal. Left Atrium: Left atrial size was normal in size. Right Atrium: Right atrial size was normal in size. Pericardium: There is no evidence of pericardial effusion. Mitral Valve: The mitral valve is normal in structure. Mild mitral valve regurgitation. MV peak gradient, 4.2 mmHg. The mean mitral valve gradient is 1.0 mmHg. Tricuspid Valve: The tricuspid valve is normal in structure. Tricuspid valve regurgitation is trivial. Aortic Valve: The aortic valve is normal in structure. Aortic valve  regurgitation is not visualized. Aortic valve mean gradient measures 2.0 mmHg. Aortic valve peak gradient measures 4.8 mmHg. Aortic valve area, by VTI measures 2.72 cm. Pulmonic Valve: The pulmonic valve was normal in structure. Pulmonic valve regurgitation is not visualized. Aorta: The ascending aorta was not well visualized. IAS/Shunts: No atrial level shunt detected by color flow Doppler. Additional Comments: Inferior posterior septal hypo. 3D was performed not requiring image post processing on an independent workstation and was indeterminate.  LEFT VENTRICLE PLAX 2D LVIDd:         4.30 cm     Diastology LVIDs:         3.20 cm     LV e' medial:    5.11 cm/s LV PW:         1.00 cm     LV E/e' medial:  16.9 LV IVS:        1.30 cm     LV e' lateral:   10.60 cm/s LVOT diam:     1.80 cm     LV E/e' lateral: 8.1 LV SV:         51 LV SV Index:   27 LVOT Area:     2.54 cm  LV Volumes (MOD) LV vol d, MOD A2C: 78.1 ml LV vol d, MOD A4C: 66.1 ml LV vol s, MOD A2C: 42.0 ml LV vol s, MOD A4C: 34.7 ml LV SV MOD A2C:     36.1 ml LV SV MOD A4C:     66.1 ml LV SV MOD BP:      34.4 ml RIGHT VENTRICLE RV Basal diam:  2.80 cm RV Mid diam:    2.30 cm RV S prime:     8.38 cm/s TAPSE (M-mode): 1.4 cm LEFT ATRIUM             Index        RIGHT ATRIUM           Index LA diam:        3.90 cm 2.04 cm/m   RA Area:     12.20 cm LA Vol (A2C):   71.0 ml 37.13 ml/m  RA Volume:   25.40 ml  13.28 ml/m LA Vol (A4C):   51.6 ml 26.98 ml/m LA Biplane Vol: 61.9 ml 32.37 ml/m  AORTIC VALVE                    PULMONIC VALVE AV Area (Vmax):    2.17 cm     PV Vmax:        0.87 m/s AV Area (Vmean):   2.28 cm     PV Peak grad:   3.0 mmHg AV Area (VTI):     2.72 cm     RVOT Peak grad: 2 mmHg AV Vmax:           109.00 cm/s AV Vmean:  69.300 cm/s AV VTI:            0.188 m AV Peak Grad:      4.8 mmHg AV Mean Grad:      2.0 mmHg LVOT Vmax:         92.80 cm/s LVOT Vmean:        62.100 cm/s LVOT VTI:          0.201 m LVOT/AV VTI ratio: 1.07   AORTA Ao Root diam: 2.40 cm Ao Asc diam:  3.00 cm MITRAL VALVE                TRICUSPID VALVE MV Area (PHT): 4.77 cm     TR Peak grad:   19.0 mmHg MV Area VTI:   1.84 cm     TR Vmax:        218.00 cm/s MV Peak grad:  4.2 mmHg MV Mean grad:  1.0 mmHg     SHUNTS MV Vmax:       1.02 m/s     Systemic VTI:  0.20 m MV Vmean:      51.1 cm/s    Systemic Diam: 1.80 cm MV Decel Time: 159 msec MV E velocity: 86.20 cm/s MV A velocity: 100.00 cm/s MV E/A ratio:  0.86 MV A Prime:    12.4 cm/s Cara JONETTA Lovelace MD Electronically signed by Cara JONETTA Lovelace MD Signature Date/Time: 11/17/2023/1:15:33 PM    Final    DG Chest 1 View Result Date: 11/16/2023 CLINICAL DATA:  Shortness of breath EXAM: CHEST  1 VIEW COMPARISON:  11/16/2023 FINDINGS: Prior CABG. Heart and mediastinal contours within normal limits. Left lung clear. Right basilar opacity is similar to prior study and could reflect atelectasis or infiltrate. Small right pleural effusions suspected. No acute bony abnormality. IMPRESSION: Right basilar airspace opacity likely reflects atelectasis or infiltrate/pneumonia. Small right pleural effusion. Electronically Signed   By: Franky Crease M.D.   On: 11/16/2023 19:25   DG Chest 2 View Result Date: 11/16/2023 CLINICAL DATA:  Shortness of breath. EXAM: CHEST - 2 VIEW COMPARISON:  01/03/2013. FINDINGS: The heart size and mediastinal contours are within normal limits. Prior median sternotomy and CABG. Streaky bibasilar scarring/subsegmental atelectasis. Suspected small right pleural effusion. No focal consolidation or pneumothorax. Cervical fusion hardware. No acute osseous abnormality. IMPRESSION: 1. Suspected small right pleural effusion. 2.  Streaky bibasilar scarring/subsegmental atelectasis. Electronically Signed   By: Harrietta Sherry M.D.   On: 11/16/2023 10:09    EKG: Normal sinus rhythm nonspecific ST-T wave changes rate of 70  ASSESSMENT AND PLAN:  Chest pain possibly cardiac Acute systolic diastolic  congestive heart failure Chronic renal insufficiency Coronary bypass surgery Acute on chronic anemia Hyperlipidemia Hypertension Mild ischemic cardiomyopathy . Plan Agree with admit for further evaluation of known coronary disease Appreciate GIs input for acute on chronic anemia probable blood loss Known coronary disease including coronary bypass surgery chest discomfort probably related to anemia no clear indication for invasive procedure at this stage Hold aspirin  for now because of possible GI bleed Hyperlipidemia continue statin therapy for lipid management Maintain hypertension management to control  Signed: Cara JONETTA Lovelace MD, 11/17/2023, 5:15 PM

## 2023-11-17 NOTE — H&P (View-Only) (Signed)
 Inpatient Consultation   Patient ID: Matthew Humphrey is a 78 y.o. male.  Requesting Provider: Devaughn Ban, MD  Date of Admission: 11/16/2023  Date of Consult: 11/17/23   Reason for Consultation: symptomatic anemia   Patient's Chief Complaint:   Chief Complaint  Patient presents with   Shortness of Breath    78 year old African-American male with history of CAD status post CABG 2012, CKD, GERD, hyperlipidemia, hypertension and osteoarthritis who presents to the hospital with worsening chest pain and shortness of breath.  Gastroenterology is consulted for symptomatic anemia.  Patient has reported dark stools intermittently at home.  He is on Mobic  up until yesterday when it was stopped by his PCP.  Was additionally taking baby aspirin .  He also had shortness of breath and chest pain.  Hemoglobin was found to be 7.4.  This improved to 8.5 with 1 unit PRBCs.  Shortness of breath was mostly with exertion, but did wake him up at times at night.  BNP elevated on arrival over 300.  Underwent cardiac workup in February with grade 1 diastolic dysfunction and ejection fraction 50%.  Notably had some CAD and bypass vessels on CT earlier this year.  Cardiology is evaluating here in the hospital.  Using Mobic  and baby aspirin  at home.  No PPI use Denies anticoagulants Denies family history of gastrointestinal disease and malignancy Previous Endoscopies: 07/2017- [facility]-diverticulosis, and external hemorrhoids, skin tag     Past Medical History:  Diagnosis Date   Arthritis    knee   Chronic kidney disease    GERD (gastroesophageal reflux disease)    Hyperlipidemia    Hypertension     Past Surgical History:  Procedure Laterality Date   CARDIAC SURGERY  03/2010   bypass   CERVICAL FUSION  12/11/2012   C4-5,C5-6,C6-7   COLONOSCOPY  2011 ?   COLONOSCOPY WITH PROPOFOL  N/A 07/11/2017   Procedure: COLONOSCOPY WITH PROPOFOL ;  Surgeon: Unk Corinn Skiff, MD;  Location: [facility];   Service: Endoscopy;  Laterality: N/A;   POLYPECTOMY N/A 07/11/2017   Procedure: POLYPECTOMY;  Surgeon: Unk Corinn Skiff, MD;  Location: [facility];  Service: Endoscopy;  Laterality: N/A;    Allergies  Allergen Reactions   Sulfa Antibiotics Other (See Comments)   Sulfamethoxazole-Trimethoprim Other (See Comments)    Other reaction(s): Unknown Other reaction(s): Unknown    Sulfasalazine Other (See Comments)    Family History  Problem Relation Age of Onset   Hypertension Father    Hypotension Mother     Social History   Tobacco Use   Smoking status: Former    Current packs/day: 0.00    Average packs/day: 1.5 packs/day for 30.0 years (45.0 ttl pk-yrs)    Types: Cigarettes    Start date: 79    Quit date: 1997    Years since quitting: 28.6   Smokeless tobacco: Former    Types: Chew   Tobacco comments:    smoking cessation materials not required  Vaping Use   Vaping status: Never Used  Substance Use Topics   Alcohol use: No    Alcohol/week: 0.0 standard drinks of alcohol   Drug use: No     Pertinent GI related history and allergies were reviewed with the patient  Review of Systems  Constitutional:  Positive for activity change. Negative for appetite change, chills, diaphoresis, fatigue, fever and unexpected weight change.  HENT:  Negative for trouble swallowing and voice change.   Respiratory:  Positive for chest tightness and shortness of  breath. Negative for wheezing.   Cardiovascular:  Positive for chest pain and leg swelling. Negative for palpitations.  Gastrointestinal:  Positive for blood in stool (melena). Negative for abdominal distention, abdominal pain, anal bleeding, constipation, diarrhea, nausea and vomiting.  Musculoskeletal:  Negative for arthralgias and myalgias.  Skin:  Negative for color change and pallor.  Neurological:  Negative for dizziness, syncope and weakness.  Psychiatric/Behavioral:  Negative for confusion. The patient is not  nervous/anxious.   All other systems reviewed and are negative.    Medications Home Medications No current facility-administered medications on file prior to encounter.   Current Outpatient Medications on File Prior to Encounter  Medication Sig Dispense Refill   amLODipine  (NORVASC ) 5 MG tablet Take 5 mg by mouth daily.     aspirin  EC 81 MG tablet Take 1 tablet (81 mg total) by mouth daily. 90 tablet 3   atorvastatin  (LIPITOR ) 80 MG tablet TAKE 1 TABLET BY MOUTH ONCE  DAILY 90 tablet 3   Baclofen  5 MG TABS Take 1 tablet (5 mg total) by mouth 3 (three) times daily as needed. 30 tablet 0   isosorbide  mononitrate (IMDUR ) 30 MG 24 hr tablet Take 30 mg by mouth daily.     metoprolol  tartrate (LOPRESSOR ) 25 MG tablet Take 0.5 tablets (12.5 mg total) by mouth 2 (two) times daily. (Patient taking differently: Take 25 mg by mouth 2 (two) times daily.) 90 tablet 1   nitroGLYCERIN  (NITROSTAT ) 0.4 MG SL tablet Place 1 tablet (0.4 mg total) under the tongue every 5 (five) minutes as needed for chest pain. 50 tablet 3   pantoprazole  (PROTONIX ) 40 MG tablet TAKE 1 TABLET BY MOUTH DAILY 90 tablet 3   Pertinent GI related medications were reviewed with the patient  Inpatient Medications  Current Facility-Administered Medications:    acetaminophen  (TYLENOL ) tablet 650 mg, 650 mg, Oral, Q4H PRN, Fredirick Glenys RAMAN, MD   amLODipine  (NORVASC ) tablet 5 mg, 5 mg, Oral, Daily, Fredirick Glenys RAMAN, MD   atorvastatin  (LIPITOR ) tablet 80 mg, 80 mg, Oral, Daily, Fredirick Glenys RAMAN, MD   baclofen  (LIORESAL ) tablet 5 mg, 5 mg, Oral, TID PRN, Fredirick Glenys RAMAN, MD   isosorbide  mononitrate (IMDUR ) 24 hr tablet 30 mg, 30 mg, Oral, Daily, Pratt, Tanya S, MD   metoprolol  tartrate (LOPRESSOR ) tablet 12.5 mg, 12.5 mg, Oral, BID, Pratt, Tanya S, MD, 12.5 mg at 11/16/23 2152   nitroGLYCERIN  (NITROSTAT ) SL tablet 0.4 mg, 0.4 mg, Sublingual, Q5 min PRN, Fredirick Glenys RAMAN, MD   ondansetron  (ZOFRAN ) injection 4 mg, 4 mg, Intravenous, Q6H PRN,  Fredirick Glenys RAMAN, MD   pantoprazole  (PROTONIX ) injection 40 mg, 40 mg, Intravenous, Q12H, Fredirick Glenys RAMAN, MD  Current Outpatient Medications:    amLODipine  (NORVASC ) 5 MG tablet, Take 5 mg by mouth daily., Disp: , Rfl:    aspirin  EC 81 MG tablet, Take 1 tablet (81 mg total) by mouth daily., Disp: 90 tablet, Rfl: 3   atorvastatin  (LIPITOR ) 80 MG tablet, TAKE 1 TABLET BY MOUTH ONCE  DAILY, Disp: 90 tablet, Rfl: 3   Baclofen  5 MG TABS, Take 1 tablet (5 mg total) by mouth 3 (three) times daily as needed., Disp: 30 tablet, Rfl: 0   isosorbide  mononitrate (IMDUR ) 30 MG 24 hr tablet, Take 30 mg by mouth daily., Disp: , Rfl:    metoprolol  tartrate (LOPRESSOR ) 25 MG tablet, Take 0.5 tablets (12.5 mg total) by mouth 2 (two) times daily. (Patient taking differently: Take 25 mg by mouth 2 (two) times daily.),  Disp: 90 tablet, Rfl: 1   nitroGLYCERIN  (NITROSTAT ) 0.4 MG SL tablet, Place 1 tablet (0.4 mg total) under the tongue every 5 (five) minutes as needed for chest pain., Disp: 50 tablet, Rfl: 3   pantoprazole  (PROTONIX ) 40 MG tablet, TAKE 1 TABLET BY MOUTH DAILY, Disp: 90 tablet, Rfl: 3   acetaminophen , baclofen , nitroGLYCERIN , ondansetron  (ZOFRAN ) IV   Objective   Vitals:   11/17/23 0700 11/17/23 0800 11/17/23 0815 11/17/23 0830  BP: (!) 153/71 (!) 160/78 (!) 163/69 (!) 140/72  Pulse: 62 74 (!) 56 70  Resp: 19 (!) 23 19 (!) 30  Temp:   98.4 F (36.9 C)   TempSrc:   Oral   SpO2: 99% 98% 98% 98%  Weight:      Height:         Physical Exam Vitals and nursing note reviewed.  Constitutional:      General: He is not in acute distress.    Appearance: He is not ill-appearing, toxic-appearing or diaphoretic.  HENT:     Head: Normocephalic and atraumatic.     Nose: Nose normal.     Mouth/Throat:     Mouth: Mucous membranes are moist.     Pharynx: Oropharynx is clear.  Eyes:     General: No scleral icterus.    Extraocular Movements: Extraocular movements intact.  Cardiovascular:     Rate and  Rhythm: Normal rate and regular rhythm.     Heart sounds: Normal heart sounds. No murmur heard.    No friction rub. No gallop.  Pulmonary:     Effort: Pulmonary effort is normal. No respiratory distress.     Breath sounds: Normal breath sounds. No wheezing, rhonchi or rales.  Abdominal:     General: Bowel sounds are normal. There is no distension.     Palpations: Abdomen is soft.     Tenderness: There is no abdominal tenderness. There is no guarding or rebound.  Musculoskeletal:     Cervical back: Neck supple.     Right lower leg: Edema present.     Left lower leg: Edema present.  Skin:    General: Skin is warm and dry.     Coloration: Skin is not jaundiced or pale.  Neurological:     General: No focal deficit present.     Mental Status: He is alert and oriented to person, place, and time. Mental status is at baseline.  Psychiatric:        Mood and Affect: Mood normal.        Behavior: Behavior normal.        Thought Content: Thought content normal.        Judgment: Judgment normal.     Laboratory Data Recent Labs  Lab 11/16/23 0923 11/16/23 1338 11/16/23 2229  WBC 6.8 7.6 7.5  HGB 7.4* 7.0* 8.4*  HCT 26.2* 26.2* 29.5*  PLT 331 335 298   Recent Labs  Lab 11/16/23 0923 11/16/23 1338 11/16/23 2229  NA 140 141 140  K 4.0 4.3 3.9  CL 107 111 109  CO2 25 25 24   BUN 23 23 21   CALCIUM  9.2 9.2 9.2  PROT 6.6  --   --   BILITOT 0.4  --   --   ALKPHOS 76  --   --   ALT 43  --   --   AST 41  --   --   GLUCOSE 80 99 91   No results for input(s): INR in the last 168 hours.  No results for input(s): LIPASE in the last 72 hours.      Imaging Studies: DG Chest 1 View Result Date: 11/16/2023 CLINICAL DATA:  Shortness of breath EXAM: CHEST  1 VIEW COMPARISON:  11/16/2023 FINDINGS: Prior CABG. Heart and mediastinal contours within normal limits. Left lung clear. Right basilar opacity is similar to prior study and could reflect atelectasis or infiltrate. Small right  pleural effusions suspected. No acute bony abnormality. IMPRESSION: Right basilar airspace opacity likely reflects atelectasis or infiltrate/pneumonia. Small right pleural effusion. Electronically Signed   By: Franky Crease M.D.   On: 11/16/2023 19:25   DG Chest 2 View Result Date: 11/16/2023 CLINICAL DATA:  Shortness of breath. EXAM: CHEST - 2 VIEW COMPARISON:  01/03/2013. FINDINGS: The heart size and mediastinal contours are within normal limits. Prior median sternotomy and CABG. Streaky bibasilar scarring/subsegmental atelectasis. Suspected small right pleural effusion. No focal consolidation or pneumothorax. Cervical fusion hardware. No acute osseous abnormality. IMPRESSION: 1. Suspected small right pleural effusion. 2.  Streaky bibasilar scarring/subsegmental atelectasis. Electronically Signed   By: Harrietta Sherry M.D.   On: 11/16/2023 10:09    Assessment:   # Symptomatic anemia; ABLA  # Melena - Suspect upper GI bleeding source given recent NSAID and aspirin  use without PPI use - Hemoglobin on presentation was 7.4 MCV 67.  - This improved with 1 unit PRBC this hospitalization -BUN/creatinine ratio not elevated -No hematemesis or coffee-ground emesis nor hematochezia  #HFpEF-most recent EF 50% with grade 1 diastolic dysfunction  #Dyspnea on exertion along with chest pain #CAD status post CABG 2012 #Hypertension #Hyperlipidemia #C-spine surgery   Plan:   Esophagogastroduodenoscopy and Colonoscopy planned for tomorrow pending patient stability, cardiology review/risk stratification and endoscopy suite availability NPO at midnight Clear liquids now  If cardiology allows for bidirectional endoscopy, will begin GoLytely prep at 1600.  Additional as needed gallon will be available if stools not clear after first.  Labs in am- bmp, cbc, inr Protonix  40 mg iv q12 h Hold dvt ppx Monitor H&H.  Transfusion and resuscitation as per primary team Avoid frequent lab draws to prevent lab  induced anemia Supportive care and antiemetics as per primary team Maintain two sites IV access Avoid nsaids Monitor for GIB.  Esophagogastroduodenoscopy and Colonoscopy with possible biopsy, control of bleeding, polypectomy, and interventions as necessary has been discussed with the patient/patient representative. Informed consent was obtained from the patient/patient representative after explaining the indication, nature, and risks of the procedure including but not limited to death, bleeding, perforation, missed neoplasm/lesions, cardiorespiratory compromise, and reaction to medications. Opportunity for questions was given and appropriate answers were provided. Patient/patient representative has verbalized understanding is amenable to undergoing the procedure.   Management of other medical comorbidities as per primary team  I personally performed the service.  Thank you for allowing us  to participate in this patient's care. Please don't hesitate to call if any questions or concerns arise.   Elspeth Ozell Jungling, DO [facility]  Portions of the record may have been created with voice recognition software. Occasional wrong-word or 'sound-a-like' substitutions may have occurred due to the inherent limitations of voice recognition software.  Read the chart carefully and recognize, using context, where substitutions may have occurred.

## 2023-11-18 ENCOUNTER — Encounter: Payer: Self-pay | Admitting: Family Medicine

## 2023-11-18 ENCOUNTER — Other Ambulatory Visit: Payer: Self-pay

## 2023-11-18 ENCOUNTER — Encounter
Admission: EM | Disposition: A | Discharge status: Home / Self Care | Payer: Self-pay | Source: Ambulatory Visit | Attending: Internal Medicine

## 2023-11-18 ENCOUNTER — Inpatient Hospital Stay: Admitting: Anesthesiology

## 2023-11-18 DIAGNOSIS — D62 Acute posthemorrhagic anemia: Secondary | ICD-10-CM | POA: Diagnosis not present

## 2023-11-18 DIAGNOSIS — K6389 Other specified diseases of intestine: Secondary | ICD-10-CM | POA: Insufficient documentation

## 2023-11-18 HISTORY — PX: COLONOSCOPY: SHX5424

## 2023-11-18 HISTORY — PX: ESOPHAGOGASTRODUODENOSCOPY: SHX5428

## 2023-11-18 LAB — CBC
HCT: 29.5 % — ABNORMAL LOW (ref 39.0–52.0)
HCT: 29.7 % — ABNORMAL LOW (ref 39.0–52.0)
Hemoglobin: 8.5 g/dL — ABNORMAL LOW (ref 13.0–17.0)
Hemoglobin: 8.8 g/dL — ABNORMAL LOW (ref 13.0–17.0)
MCH: 20 pg — ABNORMAL LOW (ref 26.0–34.0)
MCH: 20.2 pg — ABNORMAL LOW (ref 26.0–34.0)
MCHC: 28.8 g/dL — ABNORMAL LOW (ref 30.0–36.0)
MCHC: 29.6 g/dL — ABNORMAL LOW (ref 30.0–36.0)
MCV: 69.2 fL — ABNORMAL LOW (ref 80.0–100.0)
MCV: 68.1 fL — ABNORMAL LOW (ref 80.0–100.0)
Platelets: 283 K/uL (ref 150–400)
Platelets: 309 K/uL (ref 150–400)
RBC: 4.26 MIL/uL (ref 4.22–5.81)
RBC: 4.36 MIL/uL (ref 4.22–5.81)
RDW: 21.8 % — ABNORMAL HIGH (ref 11.5–15.5)
RDW: 22.1 % — ABNORMAL HIGH (ref 11.5–15.5)
WBC: 6.2 K/uL (ref 4.0–10.5)
WBC: 11.3 K/uL — ABNORMAL HIGH (ref 4.0–10.5)
nRBC: 0.3 % — ABNORMAL HIGH (ref 0.0–0.2)
nRBC: 0 % (ref 0.0–0.2)

## 2023-11-18 LAB — BASIC METABOLIC PANEL WITH GFR
Anion gap: 9 (ref 5–15)
BUN: 13 mg/dL (ref 8–23)
CO2: 25 mmol/L (ref 22–32)
Calcium: 9 mg/dL (ref 8.9–10.3)
Chloride: 104 mmol/L (ref 98–111)
Creatinine, Ser: 1.28 mg/dL — ABNORMAL HIGH (ref 0.61–1.24)
GFR, Estimated: 58 mL/min — ABNORMAL LOW (ref >60)
Glucose, Bld: 89 mg/dL (ref 70–99)
Potassium: 3.9 mmol/L (ref 3.5–5.1)
Sodium: 138 mmol/L (ref 135–145)

## 2023-11-18 LAB — TROPONIN I (HIGH SENSITIVITY)
Troponin I (High Sensitivity): 19 ng/L — ABNORMAL HIGH (ref <18)
Troponin I (High Sensitivity): 18 ng/L — ABNORMAL HIGH (ref <18)

## 2023-11-18 SURGERY — EGD (ESOPHAGOGASTRODUODENOSCOPY)
Anesthesia: General

## 2023-11-18 MED ORDER — IPRATROPIUM-ALBUTEROL 0.5-2.5 (3) MG/3ML IN SOLN
3.0000 mL | RESPIRATORY_TRACT | Status: DC
  Administered 2023-11-18: 3 mL via RESPIRATORY_TRACT

## 2023-11-18 MED ORDER — SPOT INK MARKER SYRINGE KIT
PACK | SUBMUCOSAL | Status: DC | PRN
  Administered 2023-11-18: 3 mL via SUBMUCOSAL

## 2023-11-18 MED ORDER — SODIUM CHLORIDE 0.9 % IV SOLN: INTRAVENOUS | Status: DC

## 2023-11-18 MED ORDER — IPRATROPIUM-ALBUTEROL 0.5-2.5 (3) MG/3ML IN SOLN
RESPIRATORY_TRACT | Status: AC
  Filled 2023-11-18: qty 3

## 2023-11-18 MED ORDER — EPINEPHRINE 1 MG/10ML IJ SOSY
PREFILLED_SYRINGE | INTRAMUSCULAR | Status: AC
  Filled 2023-11-18: qty 10

## 2023-11-18 MED ORDER — PANTOPRAZOLE SODIUM 40 MG PO TBEC
40.0000 mg | DELAYED_RELEASE_TABLET | Freq: Every day | ORAL | Status: DC
  Administered 2023-11-19 – 2023-11-25 (×7): 40 mg via ORAL
  Filled 2023-11-18 (×7): qty 1

## 2023-11-18 MED ORDER — SODIUM CHLORIDE 0.9 % IV SOLN: INTRAVENOUS | Status: AC

## 2023-11-18 MED ORDER — LIDOCAINE HCL (CARDIAC) PF 100 MG/5ML IV SOSY
PREFILLED_SYRINGE | INTRAVENOUS | Status: DC | PRN
  Administered 2023-11-18: 50 mg via INTRAVENOUS

## 2023-11-18 MED ORDER — PROPOFOL 10 MG/ML IV BOLUS
INTRAVENOUS | Status: DC | PRN
  Administered 2023-11-18: 125 ug/kg/min via INTRAVENOUS
  Administered 2023-11-18: 100 mg via INTRAVENOUS

## 2023-11-18 NOTE — Anesthesia Procedure Notes (Signed)
 Procedure Name: General with mask airway Date/Time: 11/18/2023 9:26 AM  Performed by: Dominica Krabbe, CRNAPre-anesthesia Checklist: Patient identified, Emergency Drugs available, Suction available, Patient being monitored and Timeout performed Patient Re-evaluated:Patient Re-evaluated prior to induction Oxygen Delivery Method: Simple face mask Preoxygenation: Pre-oxygenation with 100% oxygen Induction Type: IV induction

## 2023-11-18 NOTE — Progress Notes (Signed)
       CROSS COVER NOTE  NAME: Matthew Humphrey MRN: [id number] DOB : March 24, 1946    Concern as stated by nurse / staff   c/o midsternal chest tightness, non radiating, 5/10 started around 1700. BP is 153/64, pt came in with GI bleed which he had EGD and colonoscopy today with gastritis and obstructing colon mass. pt has hx of CAD and CABG x2, per pt he have taken nitro before, no prn, can he please have nitro SL? do you also want 12 lead and trop?      Pertinent findings on chart review: Last progress note reviewed: History as outlined above .[facility]  Patient Assessment    11/18/2023    7:50 PM 11/18/2023    7:45 PM 11/18/2023    4:14 PM  Vitals with BMI  Systolic 153 170 836  Diastolic 64 73 74  Pulse 89 93 87     workup Cardiac Panel (last 3 results) Recent Labs    11/16/23 2229 11/18/23 2020 11/18/23 2201  TROPONINIHS 19* 19* 18*     Assessment and  Interventions   Assessment:  Chest pain--> differential includes cardiac versus GI Gastritis on EEG CAD with history of CABG  Plan: EKG--->non-ischemic Troponin ordered (prior trops 16/19)-->non-ACS trend CBC also ordered(stable at 8.5) Nitroglycerin  sublingual as needed chest pain Continue meds for gastritis

## 2023-11-18 NOTE — Transfer of Care (Signed)
 Immediate Anesthesia Transfer of Care Note  Patient: Matthew Humphrey  Procedure(s) Performed: EGD (ESOPHAGOGASTRODUODENOSCOPY) COLONOSCOPY  Patient Location: PACU  Anesthesia Type:General  Level of Consciousness: awake, alert , and oriented  Airway & Oxygen Therapy: Patient Spontanous Breathing and Patient connected to nasal cannula oxygen  Post-op Assessment: Report given to RN and Post -op Vital signs reviewed and stable  Post vital signs: Reviewed and stable  Last Vitals:  Vitals Value Taken Time  BP 88/75 11/18/23 10:18  Temp    Pulse 79 11/18/23 10:18  Resp 18 11/18/23 10:18  SpO2 91 % 11/18/23 10:18    Last Pain:  Vitals:   11/18/23 0917  TempSrc: Temporal  PainSc:          Complications: No notable events documented.

## 2023-11-18 NOTE — Progress Notes (Signed)
 [facility] CLINIC CARDIOLOGY PROGRESS NOTE   Patient ID: Matthew Humphrey MRN: [id number] DOB/AGE: [birth year] 77 y.o.  Admit date: 11/16/2023 Referring Physician Dr. Devaughn Ban Primary Physician Sol, Mackey POUR, MD  Primary Cardiologist Dr. Florencio Reason for Consultation chest discomfort, anemia  HPI: Mitsugi Schrader is a 78 y.o. male with a past medical history of coronary artery disease s/p CABG 2012, hypertension, hyperlipidemia, GERD, chronic kidney disease who presented to the ED on 11/16/2023 for evaluation after having abnormal labs at his PCP office which revealed a hemoglobin of 7.  Cardiology was consulted for further evaluation as he has reported some chest discomfort over the last few months.  Interval History:  - Patient seen and examined this morning, resting comfortably in hospital bed. - States that overall he is feeling okay today, denies any recurrence of chest pain, shortness of breath. - Plan for EGD, colonoscopy today with Dr. Onita.  Review of systems complete and found to be negative unless listed above   Vitals:   11/17/23 2339 11/18/23 0614 11/18/23 0841 11/18/23 0917  BP: (!) 147/80 (!) 114/51 (!) 150/70 (!) 140/73  Pulse: 71 72 68 62  Resp: 19 20 20 16   Temp: 98.3 F (36.8 C) 98.9 F (37.2 C) 98 F (36.7 C) (!) 97 F (36.1 C)  TempSrc:    Temporal  SpO2: 98% 98% 97% 97%  Weight:    81.6 kg  Height:    5' 6 (1.676 m)     Intake/Output Summary (Last 24 hours) at 11/18/2023 9057 Last data filed at 11/17/2023 2330 Gross per 24 hour  Intake 4000 ml  Output 2350 ml  Net 1650 ml     PHYSICAL EXAM General: Well-appearing male, well nourished, in no acute distress. HEENT: Normocephalic and atraumatic. Neck: No JVD.  Lungs: Normal respiratory effort on room air. Clear bilaterally to auscultation. No wheezes, crackles, rhonchi.  Heart: HRRR. Normal S1 and S2 without gallops or murmurs. Radial & DP pulses 2+ bilaterally. Abdomen: Non-distended  appearing.  Msk: Normal strength and tone for age. Extremities: No clubbing, cyanosis or edema.   Neuro: Alert and oriented X 3. Psych: Mood appropriate, affect congruent.    LABS: Basic Metabolic Panel: Recent Labs    11/16/23 2229 11/18/23 0602  NA 140 138  K 3.9 3.9  CL 109 104  CO2 24 25  GLUCOSE 91 89  BUN 21 13  CREATININE 1.17 1.28*  CALCIUM  9.2 9.0   Liver Function Tests: Recent Labs    11/16/23 0923  AST 41  ALT 43  ALKPHOS 76  BILITOT 0.4  PROT 6.6  ALBUMIN 3.1*   No results for input(s): LIPASE, AMYLASE in the last 72 hours. CBC: Recent Labs    11/16/23 0923 11/16/23 1338 11/17/23 0912 11/18/23 0602  WBC 6.8   < > 6.0 6.2  NEUTROABS 4.2  --   --   --   HGB 7.4*   < > 8.5* 8.5*  HCT 26.2*   < > 30.7* 29.5*  MCV 67.0*   < > 69.6* 69.2*  PLT 331   < > 283 283   < > = values in this interval not displayed.   Cardiac Enzymes: Recent Labs    11/16/23 1821 11/16/23 2023 11/16/23 2229  TROPONINIHS 16 16 19*   BNP: Recent Labs    11/16/23 0923 11/16/23 1821  BNP 356.7* 290.3*   D-Dimer: No results for input(s): DDIMER in the last 72 hours. Hemoglobin A1C: No results for input(s):  HGBA1C in the last 72 hours. Fasting Lipid Panel: No results for input(s): CHOL, HDL, LDLCALC, TRIG, CHOLHDL, LDLDIRECT in the last 72 hours. Thyroid Function Tests: No results for input(s): TSH, T4TOTAL, T3FREE, THYROIDAB in the last 72 hours.  Invalid input(s): FREET3 Anemia Panel: Recent Labs    11/16/23 2229  FERRITIN 8*    ECHOCARDIOGRAM COMPLETE Result Date: 11/17/2023    ECHOCARDIOGRAM REPORT   Patient Name:   Matthew Humphrey Date of Exam: 11/17/2023 Medical Rec #:  [id number]          Height:       66.0 in Accession #:    [id number]         Weight:       180.0 lb Date of Birth:  11-21-1945           BSA:          1.912 m Patient Age:    78 years           BP:           129/64 mmHg Patient Gender: M                  HR:            57 bpm. Exam Location:  [facility] Procedure: 2D Echo, Cardiac Doppler and Color Doppler (Both Spectral and Color            Flow Doppler were utilized during procedure). Indications:     Dyspnea R06.00  History:         Patient has prior history of Echocardiogram examinations. Risk                  Factors:Hypertension.  Sonographer:     Bari Roar Referring Phys:  [id number] Nubia Ziesmer Diagnosing Phys: Cara JONETTA Lovelace MD IMPRESSIONS  1. Inferior posterior septal hypo.  2. Left ventricular ejection fraction, by estimation, is 45 to 50%. The left ventricle has mildly decreased function. The left ventricle demonstrates regional wall motion abnormalities (see scoring diagram/findings for description). Left ventricular diastolic parameters are consistent with Grade I diastolic dysfunction (impaired relaxation).  3. Right ventricular systolic function is normal. The right ventricular size is normal.  4. The mitral valve is normal in structure. Mild mitral valve regurgitation.  5. The aortic valve is normal in structure. Aortic valve regurgitation is not visualized. FINDINGS  Left Ventricle: Left ventricular ejection fraction, by estimation, is 45 to 50%. The left ventricle has mildly decreased function. The left ventricle demonstrates regional wall motion abnormalities. Strain was performed and the global longitudinal strain is indeterminate. The left ventricular internal cavity size was normal in size. There is borderline concentric left ventricular hypertrophy. Left ventricular diastolic parameters are consistent with Grade I diastolic dysfunction (impaired relaxation). Right Ventricle: The right ventricular size is normal. No increase in right ventricular wall thickness. Right ventricular systolic function is normal. Left Atrium: Left atrial size was normal in size. Right Atrium: Right atrial size was normal in size. Pericardium: There is no evidence of pericardial effusion. Mitral Valve: The mitral valve is  normal in structure. Mild mitral valve regurgitation. MV peak gradient, 4.2 mmHg. The mean mitral valve gradient is 1.0 mmHg. Tricuspid Valve: The tricuspid valve is normal in structure. Tricuspid valve regurgitation is trivial. Aortic Valve: The aortic valve is normal in structure. Aortic valve regurgitation is not visualized. Aortic valve mean gradient measures 2.0 mmHg. Aortic valve peak gradient measures 4.8 mmHg. Aortic valve area, by VTI  measures 2.72 cm. Pulmonic Valve: The pulmonic valve was normal in structure. Pulmonic valve regurgitation is not visualized. Aorta: The ascending aorta was not well visualized. IAS/Shunts: No atrial level shunt detected by color flow Doppler. Additional Comments: Inferior posterior septal hypo. 3D was performed not requiring image post processing on an independent workstation and was indeterminate.  LEFT VENTRICLE PLAX 2D LVIDd:         4.30 cm     Diastology LVIDs:         3.20 cm     LV e' medial:    5.11 cm/s LV PW:         1.00 cm     LV E/e' medial:  16.9 LV IVS:        1.30 cm     LV e' lateral:   10.60 cm/s LVOT diam:     1.80 cm     LV E/e' lateral: 8.1 LV SV:         51 LV SV Index:   27 LVOT Area:     2.54 cm  LV Volumes (MOD) LV vol d, MOD A2C: 78.1 ml LV vol d, MOD A4C: 66.1 ml LV vol s, MOD A2C: 42.0 ml LV vol s, MOD A4C: 34.7 ml LV SV MOD A2C:     36.1 ml LV SV MOD A4C:     66.1 ml LV SV MOD BP:      34.4 ml RIGHT VENTRICLE RV Basal diam:  2.80 cm RV Mid diam:    2.30 cm RV S prime:     8.38 cm/s TAPSE (M-mode): 1.4 cm LEFT ATRIUM             Index        RIGHT ATRIUM           Index LA diam:        3.90 cm 2.04 cm/m   RA Area:     12.20 cm LA Vol (A2C):   71.0 ml 37.13 ml/m  RA Volume:   25.40 ml  13.28 ml/m LA Vol (A4C):   51.6 ml 26.98 ml/m LA Biplane Vol: 61.9 ml 32.37 ml/m  AORTIC VALVE                    PULMONIC VALVE AV Area (Vmax):    2.17 cm     PV Vmax:        0.87 m/s AV Area (Vmean):   2.28 cm     PV Peak grad:   3.0 mmHg AV Area (VTI):      2.72 cm     RVOT Peak grad: 2 mmHg AV Vmax:           109.00 cm/s AV Vmean:          69.300 cm/s AV VTI:            0.188 m AV Peak Grad:      4.8 mmHg AV Mean Grad:      2.0 mmHg LVOT Vmax:         92.80 cm/s LVOT Vmean:        62.100 cm/s LVOT VTI:          0.201 m LVOT/AV VTI ratio: 1.07  AORTA Ao Root diam: 2.40 cm Ao Asc diam:  3.00 cm MITRAL VALVE                TRICUSPID VALVE MV Area (PHT): 4.77 cm     TR Peak grad:  19.0 mmHg MV Area VTI:   1.84 cm     TR Vmax:        218.00 cm/s MV Peak grad:  4.2 mmHg MV Mean grad:  1.0 mmHg     SHUNTS MV Vmax:       1.02 m/s     Systemic VTI:  0.20 m MV Vmean:      51.1 cm/s    Systemic Diam: 1.80 cm MV Decel Time: 159 msec MV E velocity: 86.20 cm/s MV A velocity: 100.00 cm/s MV E/A ratio:  0.86 MV A Prime:    12.4 cm/s Cara JONETTA Lovelace MD Electronically signed by Cara JONETTA Lovelace MD Signature Date/Time: 11/17/2023/1:15:33 PM    Final    DG Chest 1 View Result Date: 11/16/2023 CLINICAL DATA:  Shortness of breath EXAM: CHEST  1 VIEW COMPARISON:  11/16/2023 FINDINGS: Prior CABG. Heart and mediastinal contours within normal limits. Left lung clear. Right basilar opacity is similar to prior study and could reflect atelectasis or infiltrate. Small right pleural effusions suspected. No acute bony abnormality. IMPRESSION: Right basilar airspace opacity likely reflects atelectasis or infiltrate/pneumonia. Small right pleural effusion. Electronically Signed   By: Franky Crease M.D.   On: 11/16/2023 19:25   DG Chest 2 View Result Date: 11/16/2023 CLINICAL DATA:  Shortness of breath. EXAM: CHEST - 2 VIEW COMPARISON:  01/03/2013. FINDINGS: The heart size and mediastinal contours are within normal limits. Prior median sternotomy and CABG. Streaky bibasilar scarring/subsegmental atelectasis. Suspected small right pleural effusion. No focal consolidation or pneumothorax. Cervical fusion hardware. No acute osseous abnormality. IMPRESSION: 1. Suspected small right pleural  effusion. 2.  Streaky bibasilar scarring/subsegmental atelectasis. Electronically Signed   By: Harrietta Sherry M.D.   On: 11/16/2023 10:09     ECHO as above  TELEMETRY reviewed by me 11/18/23: Sinus rhythm rate 100s  EKG reviewed by me 11/18/23: Normal sinus rhythm rate 79 bpm, nonspecific ST-T changes  DATA reviewed by me 11/18/23: last 24h vitals tele labs imaging I/O, hospitalist progress note, GI notes  Principal Problem:   Acute on chronic blood loss anemia Active Problems:   Essential hypertension   Hyperlipidemia   Esophageal reflux   Coronary artery disease involving autologous vein coronary bypass graft without angina pectoris   Osteoarthritis of spine with radiculopathy, lumbar region   Heme positive stool   Acute combined systolic and diastolic congestive heart failure (HCC)    ASSESSMENT AND PLAN: Jatniel Verastegui is a 78 y.o. male with a past medical history of coronary artery disease s/p CABG 2012, hypertension, hyperlipidemia, GERD, chronic kidney disease who presented to the ED on 11/16/2023 for evaluation after having abnormal labs at his PCP office which revealed a hemoglobin of 7.  Cardiology was consulted for further evaluation as he has reported some chest discomfort over the last few months.  # Anemia # GI bleed # Chest pain # Coronary artery disease s/p CABG 2012 # Hypertension Patient presented to the ED after having lab work done with PCP which revealed hemoglobin of 7.  Also endorsing episodes of chest discomfort over the last few months.  Echo this admission with EF 45-50%, inferior, posterior septal hypokinesis, grade 1 diastolic dysfunction, mild MR. - Plan for EGD today with GI. - Continue holding aspirin  given concern for GI bleed - No plan for further cardiac invasive diagnostics at this time.  Will reassess outpatient once hemoglobin stabilizes. - Continue metoprolol  tartrate 12.5 mg twice daily, Imdur  30 mg daily, amlodipine  5 mg daily. -  Continue atorvastatin  80 mg daily  This patient's case was discussed and created with Dr. Florencio and he is in agreement.  Signed:  Danita Bloch, PA-C  11/18/2023, 9:42 AM [facility]

## 2023-11-18 NOTE — Plan of Care (Signed)

## 2023-11-18 NOTE — Anesthesia Preprocedure Evaluation (Addendum)
 Anesthesia Evaluation  Patient identified by MRN, date of birth, ID band Patient awake    Reviewed: Allergy & Precautions, NPO status , Patient's Chart, lab work & pertinent test results  History of Anesthesia Complications Negative for: history of anesthetic complications  Airway Mallampati: IV   Neck ROM: Limited    Dental  (+) Missing, Chipped   Pulmonary former smoker (quit 1997) Hx trach 2011   Pulmonary exam normal breath sounds clear to auscultation       Cardiovascular hypertension, + CAD (s/p CABG)  Normal cardiovascular exam Rhythm:Regular Rate:Normal  ECG 11/17/23:  Sinus bradycardia with sinus arrhythmia Possible Lateral infarct , age undetermined Cannot rule out Inferior infarct (cited on or before 16-Nov-2023)  Echo 05/16/23:  MILD LEFT VENTRICULAR SYSTOLIC DYSFUNCTION WITH MODERATE LVH  ESTIMATED EF: 50%  NORMAL LA PRESSURES WITH DIASTOLIC DYSFUNCTION (GRADE 1)  NORMAL RIGHT VENTRICULAR SYSTOLIC FUNCTION  VALVULAR REGURGITATION: No AR, MILD MR, No PR, TRIVIAL TR  NO VALVULAR STENOSIS  LA AREA 20cm^2  MILD MR  EF 50%   Myocardial perfusion 05/16/23:   Borderline abnormal myocardial perfusion scan is area of inferior defect with mixed redistribution there is also some gut uptake artifact which limits interpretation overall ejection fraction was mild reduced at 46%.  This is a mild to moderate risk scan consider further evaluation if symptoms warrant     Neuro/Psych S/p cervical fusion    GI/Hepatic ,GERD  ,,  Endo/Other  negative endocrine ROS    Renal/GU Renal disease (stage II CKD)     Musculoskeletal  (+) Arthritis ,    Abdominal   Peds  Hematology  (+) Blood dyscrasia, anemia   Anesthesia Other Findings Inpatient cardiology note 11/17/23:  Plan Agree with admit for further evaluation of known coronary disease Appreciate GIs input for acute on chronic anemia probable blood loss Known  coronary disease including coronary bypass surgery chest discomfort probably related to anemia no clear indication for invasive procedure at this stage Hold aspirin  for now because of possible GI bleed Hyperlipidemia continue statin therapy for lipid management Maintain hypertension management to [facility]   Outpatient Cardiology note 05/02/23:  78 y.o. male with  1. Equivalent angina (CMS-HCC)  2. Coronary artery disease of native heart with stable angina pectoris, unspecified vessel or lesion type (CMS-HCC)  3. S/P coronary artery bypass graft x 2  4. SOB (shortness of breath)  5. Hypertension  6. Hyperlipidemia, unspecified hyperlipidemia type  7. Obesity (BMI 30.0-34.9)  8. Stage 2 chronic kidney disease  9. Gastroesophageal reflux disease, unspecified whether esophagitis present   Plan   Angina, ordered Echo, NM myocardial perfusion, ECG stress test for further assessment CAD, ordered CT heart angiogram for further assessment SOB, ordered Echo, NM myocardial perfusion, ECG stress test for further assessment Hypertension, slightly elevated,150/82 today, start metoprolol  Hyperlipidemia, mild, stable, continue Lipitor , management per pcp Obesity, recommend weight loss, exercise, and portion control CKD, continue medications per nephrology  GERD, stable, continue pantoprazole  for reflux symptoms, management per pcp    Reproductive/Obstetrics                              Anesthesia Physical Anesthesia Plan  ASA: 3  Anesthesia Plan: General   Post-op Pain Management:    Induction: Intravenous  PONV Risk Score and Plan: 2 and Propofol  infusion, TIVA and Treatment may vary due to age or medical condition  Airway Management Planned: Natural Airway  Additional Equipment:  Intra-op Plan:   Post-operative Plan:   Informed Consent: I have reviewed the patients History and Physical, chart, labs and discussed the procedure including the risks, benefits  and alternatives for the proposed anesthesia with the patient or authorized representative who has indicated his/her understanding and acceptance.       Plan Discussed with: CRNA  Anesthesia Plan Comments: (LMA/GETA backup discussed.  Patient consented for risks of anesthesia including but not limited to:  - adverse reactions to medications - damage to eyes, teeth, lips or other oral mucosa - nerve damage due to positioning  - sore throat or hoarseness - damage to heart, brain, nerves, lungs, other parts of body or loss of life  Informed patient about role of CRNA in peri- and intra-operative care.  Patient voiced understanding.)         Anesthesia Quick Evaluation

## 2023-11-18 NOTE — Hospital Course (Signed)
 78 y.o. M with HTN, HLD, dCHF, CAD s/p CABG 2011 presented with symptomatic anemia, found to have colon mass.     Underwent EGD/colonoscopy 8/17 that showed large colon mass.  Biopsies sent.

## 2023-11-18 NOTE — Care Plan (Addendum)
 Brief GI Post op note  See endoscopy reports for further details  Esophagogastroduodenoscopy with some healing ulceration.  Biopsies were taken during the upper endoscopy.  Colonoscopy revealing mostly obstructing colon mass in distal ascending colon.  Circumferential.  Biopsies taken.  Tattoo placed just distal to the mass.  Mass was unable to be traversed due to luminal narrowing. One of the polyp was removed.  Recommend surgical and oncology consultation. Findings communicated to the patient and the primary team  Attempted to call patient's girlfriend at number listed, however, it went straight to voicemail and voicemail box was full.  GI to sign off. Available as needed. Please do not hesitate to call regarding questions or concerns.  Elspeth EMERSON Jungling, DO [facility]

## 2023-11-18 NOTE — Op Note (Signed)
 [facility] Gastroenterology Patient Name: Matthew Humphrey Procedure Date: 11/18/2023 7:31 AM MRN: [id number] Account #: [id number] Date of Birth: 03/29/1946 Admit Type: Inpatient Age: 78 Room: [facility] ENDO ROOM 4 Gender: Male Note Status: Finalized Instrument Name: Upper GI Scope [id number] Procedure:             Upper GI endoscopy Indications:           Iron  deficiency anemia secondary to chronic blood loss Providers:             Elspeth Ozell Jungling DO, DO Referring MD:          No Local Md, MD (Referring MD) Medicines:             Monitored Anesthesia Care Complications:         No immediate complications. Estimated blood loss:                         Minimal. Procedure:             Pre-Anesthesia Assessment:                        - Prior to the procedure, a History and Physical was                         performed, and patient medications and allergies were                         reviewed. The patient is competent. The risks and                         benefits of the procedure and the sedation options and                         risks were discussed with the patient. All questions                         were answered and informed consent was obtained.                         Patient identification and proposed procedure were                         verified by the physician, the nurse, the anesthetist                         and the technician in the endoscopy suite. Mental                         Status Examination: alert and oriented. Airway                         Examination: normal oropharyngeal airway and neck                         mobility. Respiratory Examination: clear to                         auscultation. CV Examination: RRR, no murmurs, no S3  or S4. Prophylactic Antibiotics: The patient does not                         require prophylactic antibiotics. Prior                         Anticoagulants: The patient has taken no  anticoagulant                         or antiplatelet agents. ASA Grade Assessment: III - A                         patient with severe systemic disease. After reviewing                         the risks and benefits, the patient was deemed in                         satisfactory condition to undergo the procedure. The                         anesthesia plan was to use monitored anesthesia care                         (MAC). Immediately prior to administration of                         medications, the patient was re-assessed for adequacy                         to receive sedatives. The heart rate, respiratory                         rate, oxygen saturations, blood pressure, adequacy of                         pulmonary ventilation, and response to care were                         monitored throughout the procedure. The physical                         status of the patient was re-assessed after the                         procedure.                        After obtaining informed consent, the endoscope was                         passed under direct vision. Throughout the procedure,                         the patient's blood pressure, pulse, and oxygen                         saturations were monitored continuously. The Endoscope  was introduced through the mouth, and advanced to the                         second part of duodenum. The upper GI endoscopy was                         accomplished without difficulty. The patient tolerated                         the procedure well. Findings:      The first portion of the duodenum and second portion of the duodenum       were normal. Biopsies for histology were taken with a cold forceps for       evaluation of celiac disease. Estimated blood loss was minimal.      Localized mild mucosal changes characterized by abnormal fold were found       in the duodenal bulb. Biopsies were taken with a cold forceps for        histology. Estimated blood loss was minimal.      Localized moderate inflammation characterized by erythema, healing ulcer       was found in the gastric antrum. Biopsies were taken with a cold forceps       for Helicobacter pylori testing. Estimated blood loss was minimal.      The exam of the stomach was otherwise normal.      The Z-line was irregular and was found 40 cm from the incisors. Biopsies       were taken with a cold forceps for histology. Estimated blood loss was       minimal.      Esophagogastric landmarks were identified: the gastroesophageal junction       was found at 40 cm from the incisors.      The exam of the esophagus was otherwise normal. Impression:            - Normal first portion of the duodenum and second                         portion of the duodenum. Biopsied.                        - Mucosal changes in the duodenum. Biopsied.                        - Gastritis. Biopsied.                        - Z-line irregular, 40 cm from the incisors. Biopsied.                        - Esophagogastric landmarks identified. Recommendation:        - Patient has a contact number available for                         emergencies. The signs and symptoms of potential                         delayed complications were discussed with the patient.  Return to normal activities tomorrow. Written                         discharge instructions were provided to the patient.                        - Return patient to hospital ward for ongoing care.                        - Resume previous diet.                        - Continue present medications.                        - continue ppi bid for 8 weeks                        - Await pathology results.                        - The findings and recommendations were discussed with                         the patient. Procedure Code(s):     --- Professional ---                        [id number],  Esophagogastroduodenoscopy, flexible,                         transoral; with biopsy, single or multiple Diagnosis Code(s):     --- Professional ---                        K31.89, Other diseases of stomach and duodenum                        K29.70, Gastritis, unspecified, without bleeding                        K22.89, Other specified disease of esophagus                        D50.0, Iron  deficiency anemia secondary to blood loss                         (chronic) CPT copyright 2022 American Medical Association. All rights reserved. The codes documented in this report are preliminary and upon coder review may  be revised to meet current compliance requirements. Attending Participation:      I personally performed the entire procedure. Elspeth Jungling, DO Elspeth Ozell Jungling DO, DO 11/18/2023 9:43:23 AM This report has been signed electronically. Number of Addenda: 0 Note Initiated On: 11/18/2023 7:31 AM Estimated Blood Loss:  Estimated blood loss was minimal.      [facility]

## 2023-11-18 NOTE — Anesthesia Postprocedure Evaluation (Signed)
 Anesthesia Post Note  Patient: Matthew Humphrey  Procedure(s) Performed: EGD (ESOPHAGOGASTRODUODENOSCOPY) COLONOSCOPY  Patient location during evaluation: PACU Anesthesia Type: General Level of consciousness: awake and alert, oriented and patient cooperative Pain management: pain level controlled Vital Signs Assessment: post-procedure vital signs reviewed and stable Respiratory status: spontaneous breathing, nonlabored ventilation and respiratory function stable Cardiovascular status: blood pressure returned to baseline and stable Postop Assessment: adequate PO intake Anesthetic complications: no   No notable events documented.   Last Vitals:  Vitals:   11/18/23 1058 11/18/23 1100  BP: (!) 142/67 (!) 142/67  Pulse: (!) 57 (!) 57  Resp: 15 (!) 22  Temp:  36.9 C  SpO2: 96% 100%    Last Pain:  Vitals:   11/18/23 1100  TempSrc:   PainSc: 0-No pain                 Alfonso Ruths

## 2023-11-18 NOTE — Interval H&P Note (Signed)
 History and Physical Interval Note: Consult note from 11/18/23 was reviewed and there was no interval change after seeing and examining the patient.  Hgb stable after transfusion. Written consent was obtained from the patient after discussion of risks, benefits, and alternatives. Patient has consented to proceed with Esophagogastroduodenoscopy and Colonoscopy with possible intervention   11/18/2023 9:19 AM  Matthew Humphrey  has presented today for surgery, with the diagnosis of melena, anemia.  The various methods of treatment have been discussed with the patient and family. After consideration of risks, benefits and other options for treatment, the patient has consented to  Procedure(s): EGD (ESOPHAGOGASTRODUODENOSCOPY) (N/A) COLONOSCOPY as a surgical intervention.  The patient's history has been reviewed, patient examined, no change in status, stable for surgery.  I have reviewed the patient's chart and labs.  Questions were answered to the patient's satisfaction.     Elspeth Ozell Jungling

## 2023-11-18 NOTE — Progress Notes (Addendum)
 pt is c/o midsternal chest tightness, non radiating, 5/10 started around 1700. BP is 153/64, pt came in with GI bleed which he had EGD and colonoscopy today with gastritis and obstructing colon mass. pt has hx of CAD and CABG x2, per pt he have taken nitro before, no prn here. Notified Dr. Cleatus if pt can have PRN, and if she wants 12 lead EKG or troponin. Awaiting for MD's response. Plan of care continued.   Update: MD order for stat EKG, troponin and CBC. Patient received 2 doses of nitroglycerin  SL, per pt it is helping some. Chest tightness if still 5/10, non radiating.

## 2023-11-18 NOTE — Op Note (Signed)
 [facility] Gastroenterology Patient Name: Matthew Humphrey Procedure Date: 11/18/2023 7:37 AM MRN: [id number] Account #: [id number] Date of Birth: 1945/09/10 Admit Type: Inpatient Age: 78 Room: 4 Gender: Male Note Status: Finalized Instrument Name: Colon Scope [id number] Procedure:             Colonoscopy Indications:           Iron  deficiency anemia Providers:             Elspeth Ozell Jungling DO, DO Referring MD:          No Local Md, MD (Referring MD) Medicines:             Monitored Anesthesia Care Complications:         No immediate complications. Estimated blood loss:                         Minimal. Procedure:             Pre-Anesthesia Assessment:                        - Prior to the procedure, a History and Physical was                         performed, and patient medications and allergies were                         reviewed. The patient is competent. The risks and                         benefits of the procedure and the sedation options and                         risks were discussed with the patient. All questions                         were answered and informed consent was obtained.                         Patient identification and proposed procedure were                         verified by the physician, the nurse, the anesthetist                         and the technician in the endoscopy suite. Mental                         Status Examination: alert and oriented. Airway                         Examination: normal oropharyngeal airway and neck                         mobility. Respiratory Examination: clear to                         auscultation. CV Examination: RRR, no murmurs, no S3  or S4. Prophylactic Antibiotics: The patient does not                         require prophylactic antibiotics. Prior                         Anticoagulants: The patient has taken no anticoagulant                         or antiplatelet  agents. ASA Grade Assessment: III - A                         patient with severe systemic disease. After reviewing                         the risks and benefits, the patient was deemed in                         satisfactory condition to undergo the procedure. The                         anesthesia plan was to use monitored anesthesia care                         (MAC). Immediately prior to administration of                         medications, the patient was re-assessed for adequacy                         to receive sedatives. The heart rate, respiratory                         rate, oxygen saturations, blood pressure, adequacy of                         pulmonary ventilation, and response to care were                         monitored throughout the procedure. The physical                         status of the patient was re-assessed after the                         procedure.                        After obtaining informed consent, the colonoscope was                         passed under direct vision. Throughout the procedure,                         the patient's blood pressure, pulse, and oxygen                         saturations were monitored continuously. The was  introduced through the anus with the intention of                         advancing to the cecum. The scope was advanced to the                         hepatic flexure before the procedure was aborted.                         Medications were given. The colonoscopy was somewhat                         difficult due to multiple diverticula in the colon and                         a redundant colon. Successful completion of the                         procedure was aided by lavage. The patient tolerated                         the procedure well. The quality of the bowel                         preparation was good. The rectum was photographed. Findings:      Skin tags were found on perianal  exam.      The digital rectal exam was normal. Pertinent negatives include normal       sphincter tone.      Non-bleeding internal hemorrhoids were found during retroflexion. The       hemorrhoids were Grade II (internal hemorrhoids that prolapse but reduce       spontaneously). Estimated blood loss: none.      Multiple small-mouthed diverticula were found in the sigmoid colon.       Estimated blood loss: none.      A 3 to 4 mm polyp was found in the descending colon. The polyp was       sessile. The polyp was removed with a cold snare. Resection and       retrieval were complete. Estimated blood loss was minimal.      A fungating and ulcerated partially obstructing large mass was found in       the distal ascending colon. The mass was circumferential. Oozing was       present. Biopsies were taken with a cold forceps for histology. Area was       tattooed with an injection of 3 mL of [place] ink. tattoo placed just       distal to the mass. Circumferential mass was unable to be transversed       due to mass narrowing the colonic lumen. Estimated blood loss was       minimal.      The exam was otherwise without abnormality on direct and retroflexion       views. Impression:            - Perianal skin tags found on perianal exam.                        - Non-bleeding internal hemorrhoids.                        -  Diverticulosis in the sigmoid colon.                        - One 3 to 4 mm polyp in the descending colon, removed                         with a cold snare. Resected and retrieved.                        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 Biopsied. Tattooed.                        - The examination was otherwise normal on direct and                         retroflexion views. Recommendation:        - Return patient to hospital ward for ongoing care.                        - NPO.                        - Continue  present medications.                        - Await pathology results.                        - Repeat colonoscopy after surgical intervention.                        - Return to GI office at appointment to be scheduled.                        - Refer to a Careers adviser.                        - The findings and recommendations were discussed with                         the patient.                        - The findings and recommendations were discussed with                         the referring physician. Procedure Code(s):     --- Professional ---                        [phone], 52, Colonoscopy, flexible; with removal of                         tumor(s), polyp(s), or other lesion(s) by snare                         technique  [id number], 59,52, Colonoscopy, flexible; with biopsy,                         single or multiple                        45381, 52, Colonoscopy, flexible; with directed                         submucosal injection(s), any substance Diagnosis Code(s):     --- Professional ---                        K64.1, Second degree hemorrhoids                        D12.4, Benign neoplasm of descending colon                        D49.0, Neoplasm of unspecified behavior of digestive                         system                        K56.690, Other partial intestinal obstruction                        K64.4, Residual hemorrhoidal skin tags                        D50.9, Iron  deficiency anemia, unspecified                        K57.30, Diverticulosis of large intestine without                         perforation or abscess without bleeding CPT copyright 2022 American Medical Association. All rights reserved. The codes documented in this report are preliminary and upon coder review may  be revised to meet current compliance requirements. Attending Participation:      I personally performed the entire procedure. Elspeth Jungling, DO Elspeth Ozell Jungling DO,  DO 11/18/2023 10:14:14 AM This report has been signed electronically. Number of Addenda: 0 Note Initiated On: 11/18/2023 7:37 AM Total Procedure Duration: 0 hours 17 minutes 42 seconds  Estimated Blood Loss:  Estimated blood loss was minimal.      [facility]

## 2023-11-18 NOTE — Progress Notes (Signed)
 PROGRESS NOTE    Matthew Humphrey  FMW:[id number] DOB: July 08, 1945 DOA: 11/16/2023 PCP: Kotturi, Vinay K, MD  Outpatient Specialists: cardiology    Brief Narrative:   Matthew Humphrey is a 78 y.o. male with medical history significant of coronary artery disease status post CABG 2012, HTN, HLD, GERD, CKD who reports several month history of increasing shortness of breath, chest pain, dark stools.  He was seen by his primary care physician today in the office and was noted to have low hemoglobin at 7 and an elevated BNP and was sent to the ED.  These measurements were confirmed in the ED.  The patient was noted to have heme positive stools.  Chest x-ray revealed a right basilar opacity which was a question of infiltrate versus pneumonia and small right pleural effusion.  Patient reports lower extremity swelling as well.  Of note the patient had a coronary CT in March of this year showing RCA with greater than 70% stenosis, LAD with greater than 70% stenosis, left circumflex with greater than 70% stenosis.  Patient is not had follow-up since then.  Patient was supposed to have colonoscopy approximately 3 weeks ago he missed this.  Previous colonoscopy was done in 2019 and was essentially normal.  Patient was noted to have inferolateral ST depression on EKG.  This is thought to be related to anemia.    Assessment & Plan:   Principal Problem:   Acute on chronic blood loss anemia Active Problems:   Essential hypertension   Hyperlipidemia   Esophageal reflux   Coronary artery disease involving autologous vein coronary bypass graft without angina pectoris   Osteoarthritis of spine with radiculopathy, lumbar region   Heme positive stool   Acute combined systolic and diastolic congestive heart failure (HCC)   Mass of colon  # Microcytic anemia No recent hgb for comparison but several months progressive exertional dyspnea, hgb in the 7s, microcytosis, intermittent black stools, suspect slow GI  bleed from new colonic mass (see below). Transfused 1 unit on 8/15 with appropriate rise and now stable - monitor  # Gastritis On EGD - PPI - f/u biopsy results  # Colonic mass Ascending colon, partially obstructive, concerning for malignancy - CT c/a/p tomorrow, gen surg is aware, may resect this hospitalization - IV fluids overnight in preparation for that - oncology (Dr. B) to see  # Exertional dyspnea # Orthopnea # CAD # Hx CABG With known CAD, hhx cabg x2 in 2012, non-diagnostic recent stress. Bnp elevated, ef in February of this year was 50%. Trop here 16>19, do not think ACS. Anemia likely contributing. TTE with EF 45-50, RMWAs - cardiology consulted, ischemic w/u per then - tele - hold home asa given concern for GI bleed - cont home statin - transfuse to maintain hgb above 8  # HFpEF Exertional dyspnea as above, likely exacerbated by anemia - cont home imdur  - cont home metop, does not appear to be decompensated  # HTN Here bp controlled - cont home amlodipine , metop   DVT prophylaxis: SCDs Code Status: full Family Communication: brother updated @ bedside 8/17  Level of care: Telemetry Cardiac Status is: Inpatient Remains inpatient appropriate because: severity of illness    Consultants:  Cardiology GI  Procedures: EGD/colonoscopy 8/17  Antimicrobials:  none    Subjective: Reports feeling fine  Objective: Vitals:   11/18/23 1048 11/18/23 1058 11/18/23 1100 11/18/23 1215  BP: 133/67 (!) 142/67 (!) 142/67 132/65  Pulse: (!) 57 (!) 57 (!) 57 81  Resp: 18 15 (!) 22 20  Temp:   98.4 F (36.9 C) 97.8 F (36.6 C)  TempSrc:      SpO2: 96% 96% 100% 96%  Weight:      Height:        Intake/Output Summary (Last 24 hours) at 11/18/2023 1414 Last data filed at 11/18/2023 1001 Gross per 24 hour  Intake 4200 ml  Output 0 ml  Net 4200 ml   Filed Weights   11/16/23 1334 11/18/23 0917  Weight: 81.6 kg 81.6 kg    Examination:  General exam:  Appears calm and comfortable  Respiratory system: Clear to auscultation. Respiratory effort normal. Cardiovascular system: S1 & S2 heard, RRR.   Gastrointestinal system: Abdomen is nondistended, soft and nontender. No organomegaly or masses felt.   Central nervous system: Alert and oriented. No focal neurological deficits. Extremities: Symmetric 5 x 5 power. Skin: No rashes, lesions or ulcers Psychiatry: Judgement and insight appear normal. Mood & affect appropriate.     Data Reviewed: I have personally reviewed following labs and imaging studies  CBC: Recent Labs  Lab 11/16/23 0923 11/16/23 1338 11/16/23 2229 11/17/23 0912 11/18/23 0602  WBC 6.8 7.6 7.5 6.0 6.2  NEUTROABS 4.2  --   --   --   --   HGB 7.4* 7.0* 8.4* 8.5* 8.5*  HCT 26.2* 26.2* 29.5* 30.7* 29.5*  MCV 67.0* 68.6* 70.4* 69.6* 69.2*  PLT 331 335 298 283 283   Basic Metabolic Panel: Recent Labs  Lab 11/16/23 0923 11/16/23 1338 11/16/23 2229 11/18/23 0602  NA 140 141 140 138  K 4.0 4.3 3.9 3.9  CL 107 111 109 104  CO2 25 25 24 25   GLUCOSE 80 99 91 89  BUN 23 23 21 13   CREATININE 1.31* 1.17 1.17 1.28*  CALCIUM  9.2 9.2 9.2 9.0   GFR: Estimated Creatinine Clearance: 48.5 mL/min (A) (by C-G formula based on SCr of 1.28 mg/dL (H)). Liver Function Tests: Recent Labs  Lab 11/16/23 0923  AST 41  ALT 43  ALKPHOS 76  BILITOT 0.4  PROT 6.6  ALBUMIN 3.1*   No results for input(s): LIPASE, AMYLASE in the last 168 hours. No results for input(s): AMMONIA in the last 168 hours. Coagulation Profile: No results for input(s): INR, PROTIME in the last 168 hours. Cardiac Enzymes: No results for input(s): CKTOTAL, CKMB, CKMBINDEX, TROPONINI in the last 168 hours. BNP (last 3 results) No results for input(s): PROBNP in the last 8760 hours. HbA1C: No results for input(s): HGBA1C in the last 72 hours. CBG: No results for input(s): GLUCAP in the last 168 hours. Lipid Profile: No results  for input(s): CHOL, HDL, LDLCALC, TRIG, CHOLHDL, LDLDIRECT in the last 72 hours. Thyroid Function Tests: No results for input(s): TSH, T4TOTAL, FREET4, T3FREE, THYROIDAB in the last 72 hours. Anemia Panel: Recent Labs    11/16/23 2229  FERRITIN 8*   Urine analysis: No results found for: COLORURINE, APPEARANCEUR, LABSPEC, PHURINE, GLUCOSEU, HGBUR, BILIRUBINUR, KETONESUR, PROTEINUR, UROBILINOGEN, NITRITE, LEUKOCYTESUR Sepsis Labs: @LABRCNTIP (procalcitonin:4,lacticidven:4)  )No results found for this or any previous visit (from the past 240 hours).       Radiology Studies: ECHOCARDIOGRAM COMPLETE Result Date: 11/17/2023    ECHOCARDIOGRAM REPORT   Patient Name:   Matthew Humphrey Date of Exam: 11/17/2023 Medical Rec #:  [id number]          Height:       66.0 in Accession #:    [id number]  Weight:       180.0 lb Date of Birth:  [birth year]           BSA:          1.912 m Patient Age:    78 years           BP:           129/64 mmHg Patient Gender: M                  HR:           57 bpm. Exam Location:  [facility] Procedure: 2D Echo, Cardiac Doppler and Color Doppler (Both Spectral and Color            Flow Doppler were utilized during procedure). Indications:     Dyspnea R06.00  History:         Patient has prior history of Echocardiogram examinations. Risk                  Factors:Hypertension.  Sonographer:     Bari Roar Referring Phys:  [id number] CARALYN HUDSON Diagnosing Phys: Cara JONETTA Lovelace MD IMPRESSIONS  1. Inferior posterior septal hypo.  2. Left ventricular ejection fraction, by estimation, is 45 to 50%. The left ventricle has mildly decreased function. The left ventricle demonstrates regional wall motion abnormalities (see scoring diagram/findings for description). Left ventricular diastolic parameters are consistent with Grade I diastolic dysfunction (impaired relaxation).  3. Right ventricular systolic function is normal. The right  ventricular size is normal.  4. The mitral valve is normal in structure. Mild mitral valve regurgitation.  5. The aortic valve is normal in structure. Aortic valve regurgitation is not visualized. FINDINGS  Left Ventricle: Left ventricular ejection fraction, by estimation, is 45 to 50%. The left ventricle has mildly decreased function. The left ventricle demonstrates regional wall motion abnormalities. Strain was performed and the global longitudinal strain is indeterminate. The left ventricular internal cavity size was normal in size. There is borderline concentric left ventricular hypertrophy. Left ventricular diastolic parameters are consistent with Grade I diastolic dysfunction (impaired relaxation). Right Ventricle: The right ventricular size is normal. No increase in right ventricular wall thickness. Right ventricular systolic function is normal. Left Atrium: Left atrial size was normal in size. Right Atrium: Right atrial size was normal in size. Pericardium: There is no evidence of pericardial effusion. Mitral Valve: The mitral valve is normal in structure. Mild mitral valve regurgitation. MV peak gradient, 4.2 mmHg. The mean mitral valve gradient is 1.0 mmHg. Tricuspid Valve: The tricuspid valve is normal in structure. Tricuspid valve regurgitation is trivial. Aortic Valve: The aortic valve is normal in structure. Aortic valve regurgitation is not visualized. Aortic valve mean gradient measures 2.0 mmHg. Aortic valve peak gradient measures 4.8 mmHg. Aortic valve area, by VTI measures 2.72 cm. Pulmonic Valve: The pulmonic valve was normal in structure. Pulmonic valve regurgitation is not visualized. Aorta: The ascending aorta was not well visualized. IAS/Shunts: No atrial level shunt detected by color flow Doppler. Additional Comments: Inferior posterior septal hypo. 3D was performed not requiring image post processing on an independent workstation and was indeterminate.  LEFT VENTRICLE PLAX 2D LVIDd:          4.30 cm     Diastology LVIDs:         3.20 cm     LV e' medial:    5.11 cm/s LV PW:         1.00 cm     LV E/e'  medial:  16.9 LV IVS:        1.30 cm     LV e' lateral:   10.60 cm/s LVOT diam:     1.80 cm     LV E/e' lateral: 8.1 LV SV:         51 LV SV Index:   27 LVOT Area:     2.54 cm  LV Volumes (MOD) LV vol d, MOD A2C: 78.1 ml LV vol d, MOD A4C: 66.1 ml LV vol s, MOD A2C: 42.0 ml LV vol s, MOD A4C: 34.7 ml LV SV MOD A2C:     36.1 ml LV SV MOD A4C:     66.1 ml LV SV MOD BP:      34.4 ml RIGHT VENTRICLE RV Basal diam:  2.80 cm RV Mid diam:    2.30 cm RV S prime:     8.38 cm/s TAPSE (M-mode): 1.4 cm LEFT ATRIUM             Index        RIGHT ATRIUM           Index LA diam:        3.90 cm 2.04 cm/m   RA Area:     12.20 cm LA Vol (A2C):   71.0 ml 37.13 ml/m  RA Volume:   25.40 ml  13.28 ml/m LA Vol (A4C):   51.6 ml 26.98 ml/m LA Biplane Vol: 61.9 ml 32.37 ml/m  AORTIC VALVE                    PULMONIC VALVE AV Area (Vmax):    2.17 cm     PV Vmax:        0.87 m/s AV Area (Vmean):   2.28 cm     PV Peak grad:   3.0 mmHg AV Area (VTI):     2.72 cm     RVOT Peak grad: 2 mmHg AV Vmax:           109.00 cm/s AV Vmean:          69.300 cm/s AV VTI:            0.188 m AV Peak Grad:      4.8 mmHg AV Mean Grad:      2.0 mmHg LVOT Vmax:         92.80 cm/s LVOT Vmean:        62.100 cm/s LVOT VTI:          0.201 m LVOT/AV VTI ratio: 1.07  AORTA Ao Root diam: 2.40 cm Ao Asc diam:  3.00 cm MITRAL VALVE                TRICUSPID VALVE MV Area (PHT): 4.77 cm     TR Peak grad:   19.0 mmHg MV Area VTI:   1.84 cm     TR Vmax:        218.00 cm/s MV Peak grad:  4.2 mmHg MV Mean grad:  1.0 mmHg     SHUNTS MV Vmax:       1.02 m/s     Systemic VTI:  0.20 m MV Vmean:      51.1 cm/s    Systemic Diam: 1.80 cm MV Decel Time: 159 msec MV E velocity: 86.20 cm/s MV A velocity: 100.00 cm/s MV E/A ratio:  0.86 MV A Prime:    12.4 cm/s Dwayne JONETTA Lovelace MD Electronically signed by Cara JONETTA Lovelace MD Signature Date/Time: 11/17/2023/1:15:33  PM     Final    DG Chest 1 View Result Date: 11/16/2023 CLINICAL DATA:  Shortness of breath EXAM: CHEST  1 VIEW COMPARISON:  11/16/2023 FINDINGS: Prior CABG. Heart and mediastinal contours within normal limits. Left lung clear. Right basilar opacity is similar to prior study and could reflect atelectasis or infiltrate. Small right pleural effusions suspected. No acute bony abnormality. IMPRESSION: Right basilar airspace opacity likely reflects atelectasis or infiltrate/pneumonia. Small right pleural effusion. Electronically Signed   By: Franky Crease M.D.   On: 11/16/2023 19:25        Scheduled Meds:  amLODipine   5 mg Oral Daily   atorvastatin   80 mg Oral Daily   isosorbide  mononitrate  30 mg Oral Daily   metoprolol  tartrate  12.5 mg Oral BID   pantoprazole  (PROTONIX ) IV  40 mg Intravenous Q12H   Continuous Infusions:   LOS: 2 days     Devaughn KATHEE Ban, MD [facility]   If 7PM-7AM, please contact night-coverage [email or web address] Password [facility] 11/18/2023, 2:14 PM

## 2023-11-19 ENCOUNTER — Inpatient Hospital Stay

## 2023-11-19 ENCOUNTER — Encounter: Payer: Self-pay | Admitting: Gastroenterology

## 2023-11-19 DIAGNOSIS — D62 Acute posthemorrhagic anemia: Secondary | ICD-10-CM | POA: Diagnosis not present

## 2023-11-19 LAB — BASIC METABOLIC PANEL WITH GFR
Anion gap: 7 (ref 5–15)
BUN: 13 mg/dL (ref 8–23)
CO2: 24 mmol/L (ref 22–32)
Calcium: 9 mg/dL (ref 8.9–10.3)
Chloride: 106 mmol/L (ref 98–111)
Creatinine, Ser: 1.26 mg/dL — ABNORMAL HIGH (ref 0.61–1.24)
GFR, Estimated: 59 mL/min — ABNORMAL LOW (ref >60)
Glucose, Bld: 109 mg/dL — ABNORMAL HIGH (ref 70–99)
Potassium: 3.8 mmol/L (ref 3.5–5.1)
Sodium: 137 mmol/L (ref 135–145)

## 2023-11-19 LAB — CBC
HCT: 30.1 % — ABNORMAL LOW (ref 39.0–52.0)
Hemoglobin: 8.7 g/dL — ABNORMAL LOW (ref 13.0–17.0)
MCH: 19.7 pg — ABNORMAL LOW (ref 26.0–34.0)
MCHC: 28.9 g/dL — ABNORMAL LOW (ref 30.0–36.0)
MCV: 68.3 fL — ABNORMAL LOW (ref 80.0–100.0)
Platelets: 293 K/uL (ref 150–400)
RBC: 4.41 MIL/uL (ref 4.22–5.81)
RDW: 22.2 % — ABNORMAL HIGH (ref 11.5–15.5)
WBC: 11.7 K/uL — ABNORMAL HIGH (ref 4.0–10.5)
nRBC: 0 % (ref 0.0–0.2)

## 2023-11-19 MED ORDER — SODIUM CHLORIDE 0.9 % IV SOLN
1.0000 g | Freq: Two times a day (BID) | INTRAVENOUS | Status: DC
  Administered 2023-11-21: 2 g via INTRAVENOUS
  Filled 2023-11-19 (×2): qty 1

## 2023-11-19 MED ORDER — IOHEXOL 9 MG/ML PO SOLN
500.0000 mL | ORAL | Status: AC
  Administered 2023-11-19 (×2): 500 mL via ORAL

## 2023-11-19 MED ORDER — SIMETHICONE 80 MG PO CHEW
80.0000 mg | CHEWABLE_TABLET | Freq: Four times a day (QID) | ORAL | Status: DC
  Administered 2023-11-20 – 2023-11-25 (×18): 80 mg via ORAL
  Filled 2023-11-19 (×17): qty 1

## 2023-11-19 MED ORDER — PEG 3350-KCL-NA BICARB-NACL 420 G PO SOLR
2000.0000 mL | Freq: Once | ORAL | Status: AC | PRN
  Administered 2023-11-20: 2000 mL via ORAL
  Filled 2023-11-19: qty 4000

## 2023-11-19 MED ORDER — NEOMYCIN SULFATE 500 MG PO TABS
500.0000 mg | ORAL_TABLET | ORAL | Status: AC
  Administered 2023-11-20 (×3): 500 mg via ORAL
  Filled 2023-11-19 (×3): qty 1

## 2023-11-19 MED ORDER — METRONIDAZOLE 500 MG PO TABS
500.0000 mg | ORAL_TABLET | ORAL | Status: AC
  Administered 2023-11-20 (×3): 500 mg via ORAL
  Filled 2023-11-19 (×3): qty 1

## 2023-11-19 MED ORDER — FERROUS SULFATE 325 (65 FE) MG PO TABS
325.0000 mg | ORAL_TABLET | ORAL | Status: DC
  Administered 2023-11-19 – 2023-11-25 (×4): 325 mg via ORAL
  Filled 2023-11-19 (×5): qty 1

## 2023-11-19 MED ORDER — IOHEXOL 300 MG/ML  SOLN
100.0000 mL | Freq: Once | INTRAMUSCULAR | Status: AC | PRN
  Administered 2023-11-19: 100 mL via INTRAVENOUS

## 2023-11-19 NOTE — TOC CM/SW Note (Signed)
 Transition of Care [facility]) - Inpatient Brief Assessment   Patient Details  Name: Matthew Humphrey MRN: [id number] Date of Birth: May 24, 1945  [facility] of [facility]) CM/SW Contact:    Lauraine JAYSON Carpen, LCSW Phone Number: 11/19/2023, 12:39 PM   Clinical Narrative: CSW reviewed chart. SDOH flag for social isolation. Resources added to AVS. No other TOC needs identified so far. CSW will continue to follow progress. Please place [facility] consult if any needs arise.  Transition of Care Asessment: Insurance and Status: Insurance coverage has been reviewed Patient has primary care physician: Yes Home environment has been reviewed: Single family home Prior level of function:: Not documented Prior/Current Home Services: No current home services Social Drivers of Health Review: SDOH reviewed interventions complete Readmission risk has been reviewed: Yes Transition of care needs: no transition of care needs at this time

## 2023-11-19 NOTE — Progress Notes (Signed)
 [facility] CLINIC CARDIOLOGY PROGRESS NOTE   Patient ID: Matthew Humphrey MRN: [id number] DOB/AGE: 1945-11-28 78 y.o.  Admit date: 11/16/2023 Referring Physician Dr. Devaughn Ban Primary Physician Sol, Mackey POUR, MD  Primary Cardiologist Dr. Florencio Reason for Consultation chest discomfort, anemia  HPI: Matthew Humphrey is a 78 y.o. male with a past medical history of coronary artery disease s/p CABG 2012, hypertension, hyperlipidemia, GERD, chronic kidney disease who presented to the ED on 11/16/2023 for evaluation after having abnormal labs at his PCP office which revealed a hemoglobin of 7.  Cardiology was consulted for further evaluation as he has reported some chest discomfort over the last few months.  Interval History:  - Patient seen and examined this morning, resting comfortably in hospital bed. - States that overall he is feeling good today, denies any recurrence of chest pain, shortness of breath.   Review of systems complete and found to be negative unless listed above   Vitals:   11/19/23 0009 11/19/23 0529 11/19/23 0731 11/19/23 1128  BP: (!) 122/57 136/69 (!) 145/73 128/64  Pulse: 68 76 84 70  Resp: (!) 22 20 20 18   Temp: 98 F (36.7 C) 98.6 F (37 C) 99 F (37.2 C) 98.2 F (36.8 C)  TempSrc: Oral Oral Oral Oral  SpO2: 95% 94% 93% 98%  Weight:      Height:         Intake/Output Summary (Last 24 hours) at 11/19/2023 1156 Last data filed at 11/19/2023 1100 Gross per 24 hour  Intake 2467.92 ml  Output --  Net 2467.92 ml     PHYSICAL EXAM General: Well-appearing male, well nourished, in no acute distress. HEENT: Normocephalic and atraumatic. Neck: No JVD.  Lungs: Normal respiratory effort on room air. Clear bilaterally to auscultation. No wheezes, crackles, rhonchi.  Heart: HRRR. Normal S1 and S2 without gallops or murmurs. Radial & DP pulses 2+ bilaterally. Abdomen: Non-distended appearing.  Msk: Normal strength and tone for age. Extremities: No clubbing,  cyanosis or edema.   Neuro: Alert and oriented X 3. Psych: Mood appropriate, affect congruent.    LABS: Basic Metabolic Panel: Recent Labs    11/18/23 0602 11/19/23 0409  NA 138 137  K 3.9 3.8  CL 104 106  CO2 25 24  GLUCOSE 89 109*  BUN 13 13  CREATININE 1.28* 1.26*  CALCIUM  9.0 9.0   Liver Function Tests: No results for input(s): AST, ALT, ALKPHOS, BILITOT, PROT, ALBUMIN in the last 72 hours.  No results for input(s): LIPASE, AMYLASE in the last 72 hours. CBC: Recent Labs    11/18/23 2020 11/19/23 0409  WBC 11.3* 11.7*  HGB 8.8* 8.7*  HCT 29.7* 30.1*  MCV 68.1* 68.3*  PLT 309 293   Cardiac Enzymes: Recent Labs    11/16/23 2229 11/18/23 2020 11/18/23 2201  TROPONINIHS 19* 19* 18*   BNP: Recent Labs    11/16/23 1821  BNP 290.3*   D-Dimer: No results for input(s): DDIMER in the last 72 hours. Hemoglobin A1C: No results for input(s): HGBA1C in the last 72 hours. Fasting Lipid Panel: No results for input(s): CHOL, HDL, LDLCALC, TRIG, CHOLHDL, LDLDIRECT in the last 72 hours. Thyroid Function Tests: No results for input(s): TSH, T4TOTAL, T3FREE, THYROIDAB in the last 72 hours.  Invalid input(s): FREET3 Anemia Panel: Recent Labs    11/16/23 2229  FERRITIN 8*    ECHOCARDIOGRAM COMPLETE Result Date: 11/17/2023    ECHOCARDIOGRAM REPORT   Patient Name:   Matthew Humphrey Date of Exam:  11/17/2023 Medical Rec #:  [id number]          Height:       66.0 in Accession #:    [id number]         Weight:       180.0 lb Date of Birth:  [birth year]           BSA:          1.912 m Patient Age:    78 years           BP:           129/64 mmHg Patient Gender: M                  HR:           57 bpm. Exam Location:  [facility] Procedure: 2D Echo, Cardiac Doppler and Color Doppler (Both Spectral and Color            Flow Doppler were utilized during procedure). Indications:     Dyspnea R06.00  History:         Patient has prior history of  Echocardiogram examinations. Risk                  Factors:Hypertension.  Sonographer:     Bari Roar Referring Phys:  [id number] CARALYN HUDSON Diagnosing Phys: Cara JONETTA Lovelace MD IMPRESSIONS  1. Inferior posterior septal hypo.  2. Left ventricular ejection fraction, by estimation, is 45 to 50%. The left ventricle has mildly decreased function. The left ventricle demonstrates regional wall motion abnormalities (see scoring diagram/findings for description). Left ventricular diastolic parameters are consistent with Grade I diastolic dysfunction (impaired relaxation).  3. Right ventricular systolic function is normal. The right ventricular size is normal.  4. The mitral valve is normal in structure. Mild mitral valve regurgitation.  5. The aortic valve is normal in structure. Aortic valve regurgitation is not visualized. FINDINGS  Left Ventricle: Left ventricular ejection fraction, by estimation, is 45 to 50%. The left ventricle has mildly decreased function. The left ventricle demonstrates regional wall motion abnormalities. Strain was performed and the global longitudinal strain is indeterminate. The left ventricular internal cavity size was normal in size. There is borderline concentric left ventricular hypertrophy. Left ventricular diastolic parameters are consistent with Grade I diastolic dysfunction (impaired relaxation). Right Ventricle: The right ventricular size is normal. No increase in right ventricular wall thickness. Right ventricular systolic function is normal. Left Atrium: Left atrial size was normal in size. Right Atrium: Right atrial size was normal in size. Pericardium: There is no evidence of pericardial effusion. Mitral Valve: The mitral valve is normal in structure. Mild mitral valve regurgitation. MV peak gradient, 4.2 mmHg. The mean mitral valve gradient is 1.0 mmHg. Tricuspid Valve: The tricuspid valve is normal in structure. Tricuspid valve regurgitation is trivial. Aortic Valve: The aortic  valve is normal in structure. Aortic valve regurgitation is not visualized. Aortic valve mean gradient measures 2.0 mmHg. Aortic valve peak gradient measures 4.8 mmHg. Aortic valve area, by VTI measures 2.72 cm. Pulmonic Valve: The pulmonic valve was normal in structure. Pulmonic valve regurgitation is not visualized. Aorta: The ascending aorta was not well visualized. IAS/Shunts: No atrial level shunt detected by color flow Doppler. Additional Comments: Inferior posterior septal hypo. 3D was performed not requiring image post processing on an independent workstation and was indeterminate.  LEFT VENTRICLE PLAX 2D LVIDd:         4.30 cm     Diastology LVIDs:  3.20 cm     LV e' medial:    5.11 cm/s LV PW:         1.00 cm     LV E/e' medial:  16.9 LV IVS:        1.30 cm     LV e' lateral:   10.60 cm/s LVOT diam:     1.80 cm     LV E/e' lateral: 8.1 LV SV:         51 LV SV Index:   27 LVOT Area:     2.54 cm  LV Volumes (MOD) LV vol d, MOD A2C: 78.1 ml LV vol d, MOD A4C: 66.1 ml LV vol s, MOD A2C: 42.0 ml LV vol s, MOD A4C: 34.7 ml LV SV MOD A2C:     36.1 ml LV SV MOD A4C:     66.1 ml LV SV MOD BP:      34.4 ml RIGHT VENTRICLE RV Basal diam:  2.80 cm RV Mid diam:    2.30 cm RV S prime:     8.38 cm/s TAPSE (M-mode): 1.4 cm LEFT ATRIUM             Index        RIGHT ATRIUM           Index LA diam:        3.90 cm 2.04 cm/m   RA Area:     12.20 cm LA Vol (A2C):   71.0 ml 37.13 ml/m  RA Volume:   25.40 ml  13.28 ml/m LA Vol (A4C):   51.6 ml 26.98 ml/m LA Biplane Vol: 61.9 ml 32.37 ml/m  AORTIC VALVE                    PULMONIC VALVE AV Area (Vmax):    2.17 cm     PV Vmax:        0.87 m/s AV Area (Vmean):   2.28 cm     PV Peak grad:   3.0 mmHg AV Area (VTI):     2.72 cm     RVOT Peak grad: 2 mmHg AV Vmax:           109.00 cm/s AV Vmean:          69.300 cm/s AV VTI:            0.188 m AV Peak Grad:      4.8 mmHg AV Mean Grad:      2.0 mmHg LVOT Vmax:         92.80 cm/s LVOT Vmean:        62.100 cm/s LVOT VTI:           0.201 m LVOT/AV VTI ratio: 1.07  AORTA Ao Root diam: 2.40 cm Ao Asc diam:  3.00 cm MITRAL VALVE                TRICUSPID VALVE MV Area (PHT): 4.77 cm     TR Peak grad:   19.0 mmHg MV Area VTI:   1.84 cm     TR Vmax:        218.00 cm/s MV Peak grad:  4.2 mmHg MV Mean grad:  1.0 mmHg     SHUNTS MV Vmax:       1.02 m/s     Systemic VTI:  0.20 m MV Vmean:      51.1 cm/s    Systemic Diam: 1.80 cm MV Decel Time: 159 msec MV E velocity: 86.20  cm/s MV A velocity: 100.00 cm/s MV E/A ratio:  0.86 MV A Prime:    12.4 cm/s Cara JONETTA Lovelace MD Electronically signed by Cara JONETTA Lovelace MD Signature Date/Time: 11/17/2023/1:15:33 PM    Final      ECHO as above  TELEMETRY reviewed by me 11/19/23: Sinus rhythm rate, 70s  EKG reviewed by me 11/19/23: Normal sinus rhythm rate 79 bpm, nonspecific ST-T changes  DATA reviewed by me 11/19/23: last 24h vitals tele labs imaging I/O, hospitalist progress note, GI notes  Principal Problem:   Acute on chronic blood loss anemia Active Problems:   Essential hypertension   Hyperlipidemia   Esophageal reflux   Coronary artery disease involving autologous vein coronary bypass graft without angina pectoris   Osteoarthritis of spine with radiculopathy, lumbar region   Heme positive stool   Acute combined systolic and diastolic congestive heart failure (HCC)   Mass of colon    ASSESSMENT AND PLAN: Matthew Humphrey is a 78 y.o. male with a past medical history of coronary artery disease s/p CABG 2012, hypertension, hyperlipidemia, GERD, chronic kidney disease who presented to the ED on 11/16/2023 for evaluation after having abnormal labs at his PCP office which revealed a hemoglobin of 7.  Cardiology was consulted for further evaluation as he has reported some chest discomfort over the last few months.  # Colon Mass # Anemia # GI bleed # Chest pain, no more recurrence # Coronary artery disease s/p CABG 2012 # Hypertension Patient presented to the ED after  having lab work done with PCP which revealed hemoglobin of 7.  Also endorsing episodes of chest discomfort over the last few months.  Echo this admission with EF 45-50%, inferior, posterior septal hypokinesis, grade 1 diastolic dysfunction, mild MR. EGD revealed healing ulcerations and BX were taken. Colonscopy revealed obstructing colon mass in distal ascending colon, biopsies were taken. - Continue holding aspirin  given concern for GI bleed.  Recommend resuming when cleared by GI.  This can also be resumed outpatient follow-up. - No plan for further cardiac invasive diagnostics at this time.  Will reassess outpatient once hemoglobin stabilizes. - Continue metoprolol  tartrate 12.5 mg twice daily, Imdur  30 mg daily, amlodipine  5 mg daily. - Continue atorvastatin  80 mg daily  No further cardiac recommendations at this time. Cardiology will sign off. Please haiku with questions or re-engage if needed.   This patient's case was discussed and created with Dr. Lovelace and he is in agreement.  Signed:  Sakina Briones, PA-C  11/19/2023, 11:56 AM [facility]

## 2023-11-19 NOTE — Consult Note (Signed)
 WOC team consulted for ostomy marking for potential ostomy, surgery planned for 11/22/2023 colon resection by Dr. Tye.   Will plan to follow for marking Tuesday 11/20/2023.    Thank you,    Powell Bar MSN, RN-BC, [employer]

## 2023-11-19 NOTE — Discharge Instructions (Signed)

## 2023-11-19 NOTE — Progress Notes (Signed)
 Heart Failure Navigator Progress Note  Assessed for Heart & Vascular TOC clinic readiness.  Patient does not meet criteria due to current [facility] patient.   Navigator will sign off at this time.  Charmaine Pines, RN, BSN [facility] Heart Failure Navigator Secure Chat Only

## 2023-11-19 NOTE — Care Management Important Message (Signed)
 Important Message  Patient Details  Name: Matthew Humphrey MRN: [id number] Date of Birth: December 24, 1945   Important Message Given:  Yes - Medicare IM     Rojelio SHAUNNA Rattler 11/19/2023, 2:07 PM

## 2023-11-19 NOTE — Progress Notes (Signed)
  Progress Note   Patient: Matthew Humphrey FMW:[id number] DOB: 1945-11-17 DOA: 11/16/2023     3 DOS: the patient was seen and examined on 11/19/2023 at 8:46AM      Brief hospital course: 78 y.o. M with HTN, HLD, dCHF, CAD s/p CABG 2011 presented with symptomatic anemia, found to have colon mass.     Underwent EGD/colonoscopy 8/17 that showed large colon mass.  Biopsies sent.     Assessment and Plan: *Colon mass Patient admitted with anemia, found to have large colon mass.  Underwent EGD and colonoscopy that confirmed same, biopsies sent.  On CT today, general surgery suspect that he has lymph node metastases, and so are recommending colectomy during this hospitalization - Consult general surgery, appreciate cares - Dr. Brahmanday/oncology aware of patient    Chronic blood loss anemia Patient admitted and underwent endoscopy and colonoscopy by Dr. Onita GI.  This showed large colon mass, likely cause of iron  deficiency anemia - Start ferrous sulfate    Acute combined systolic and diastolic congestive heart failure (HCC) Found to have elevated BNP, right-sided infiltrate and effusion, shortness of breath.  Echocardiogram shows EF 45-50%.  Treated with IV Lasix , now appears euvolemic  Coronary artery disease involving autologous vein coronary bypass graft with chronic angina pectoris Hyperlipidemia Hypertension  Blood pressure normal - Continue amlodipine , Imdur , metoprolol  - Hold aspirin  given anemia - Continue Lipitor          Subjective: Patient feels tired but overall okay.  No fever, no vomiting.     Physical Exam: BP (!) 118/56 (BP Location: Right Arm)   Pulse 70   Temp 98.7 F (37.1 C) (Oral)   Resp 18   Ht 5' 6 (1.676 m)   Wt 81.6 kg   SpO2 97%   BMI 29.04 kg/m   Elderly adult male, lying in bed, appears weak and tired RRR, no murmurs, no peripheral edema Respiratory rate normal, lungs clear without rales or wheezes Abdomen soft no tenderness  palpation or guarding, no ascites or distention   Data Reviewed: Discussed with general surgery Basic metabolic panel shows creatinine stable at 1.2 CBC shows hemoglobin stable at 8.7  CT abdomen and pelvis shows known colon mass, only possible metastases are in abdominal lymph nodes   Family Communication: None present    Disposition: Status is: Inpatient Adult male admitted for symptomatic anemia, found to have large colon mass  Given CT findings today of possible metastases to lymph nodes, general surgery recommend colectomy during this hospitalization        Author: Lonni SHAUNNA Dalton, MD 11/19/2023 5:00 PM  For on call review [email or web address].

## 2023-11-19 NOTE — Plan of Care (Signed)
  Problem: Activity: Goal: Ability to tolerate increased activity will improve Outcome: Progressing   Problem: Cardiac: Goal: Ability to achieve and maintain adequate cardiovascular perfusion will improve Outcome: Progressing   Problem: Clinical Measurements: Goal: Will remain free from infection Outcome: Progressing   Problem: Nutrition: Goal: Adequate nutrition will be maintained Outcome: Progressing   Problem: Coping: Goal: Level of anxiety will decrease Outcome: Progressing   Problem: Elimination: Goal: Will not experience complications related to urinary retention Outcome: Progressing   Problem: Pain Managment: Goal: General experience of comfort will improve and/or be controlled Outcome: Progressing   Problem: Safety: Goal: Ability to remain free from injury will improve Outcome: Progressing

## 2023-11-19 NOTE — H&P (View-Only) (Signed)
 [facility]  SURGICAL CONSULTATION NOTE    HISTORY OF PRESENT ILLNESS (HPI):  78 y.o. male presented to [facility] ED on 11/16/23 for evaluation of shortness of breath with exertion and dark stools. Patient states he has been noticing intermittent dark stools for the past 3 months. Has been taking Mobic  and baby aspirin  at home. Denies being on anticoagulations. Had a colonoscopy scheduled but he canceled the appointment. Last colonoscopy was on April 2019 which showed diverticulosis and external hemorrhoids but was otherwise normal.   Patient had been to doctors visit that morning due to feeling short of breath, patient had low Hbg on blood work and was advised to go to the ED for blood transfusion. In the ED, patient was afebrile, hypertensive with a BP of 165/70, HR of 66 and RR of 26.  Patient had a low Hbg of 7.4 with normal WBC and platelet count. He received 1 unit of PRBC in the ED that same day and Hbg improved to 8.4. Patient had an elevated creatinine level of 1.31. No electrolyte disturbance noted. Chest x-ray was obtained due to shortness of breath which showed small right pleural effusion. No other imaging was obtained.  GI was consulted for symptomatic anemia the following day and was scheduled for upper and lower endoscopy. Upper endoscopy showed gastritis and lower endoscopy showed partially obstructing tumor in the distal ascending colon and non-bleeding internal hemorrhoids.   Surgery is consulted by Dr. Kandis in this context for evaluation and management of likely malignant partially obstructing tumor of ascending colon.   PAST MEDICAL HISTORY (PMH):  Past Medical History:  Diagnosis Date   Arthritis    knee   Chronic kidney disease    GERD (gastroesophageal reflux disease)    Hyperlipidemia    Hypertension      PAST SURGICAL HISTORY (PSH):  Past Surgical History:  Procedure Laterality Date   CARDIAC SURGERY  03/2010   bypass   CERVICAL FUSION  12/11/2012    C4-5,C5-6,C6-7   COLONOSCOPY  2011 ?   COLONOSCOPY  11/18/2023   Procedure: COLONOSCOPY;  Surgeon: Onita Elspeth Sharper, DO;  Location: [facility] ENDOSCOPY;  Service: Gastroenterology;;   COLONOSCOPY WITH PROPOFOL  N/A 07/11/2017   Procedure: COLONOSCOPY WITH PROPOFOL ;  Surgeon: Unk Corinn Skiff, MD;  Location: [facility] SURGERY [facility];  Service: Endoscopy;  Laterality: N/A;   ESOPHAGOGASTRODUODENOSCOPY N/A 11/18/2023   Procedure: EGD (ESOPHAGOGASTRODUODENOSCOPY);  Surgeon: Onita Elspeth Sharper, DO;  Location: [facility] ENDOSCOPY;  Service: Gastroenterology;  Laterality: N/A;   POLYPECTOMY N/A 07/11/2017   Procedure: POLYPECTOMY;  Surgeon: Unk Corinn Skiff, MD;  Location: [facility] SURGERY [facility];  Service: Endoscopy;  Laterality: N/A;     MEDICATIONS:  Prior to Admission medications   Medication Sig Start Date End Date Taking? Authorizing Provider  amLODipine  (NORVASC ) 5 MG tablet Take 5 mg by mouth daily.   Yes [provider]  aspirin  EC 81 MG tablet Take 1 tablet (81 mg total) by mouth daily. 09/11/19  Yes Joshua Cathryne BROCKS, MD  atorvastatin  (LIPITOR ) 80 MG tablet TAKE 1 TABLET BY MOUTH ONCE  DAILY 04/25/23  Yes Jones, Deanna C, MD  Baclofen  5 MG TABS Take 1 tablet (5 mg total) by mouth 3 (three) times daily as needed. 11/01/23  Yes Alvia Selinda PARAS, MD  isosorbide  mononitrate (IMDUR ) 30 MG 24 hr tablet Take 30 mg by mouth daily.   Yes [provider]  metoprolol  tartrate (LOPRESSOR ) 25 MG tablet Take 0.5 tablets (12.5 mg total) by mouth 2 (two) times daily.  Patient taking differently: Take 25 mg by mouth 2 (two) times daily. 05/19/22  Yes Joshua Cathryne BROCKS, MD  nitroGLYCERIN  (NITROSTAT ) 0.4 MG SL tablet Place 1 tablet (0.4 mg total) under the tongue every 5 (five) minutes as needed for chest pain. 04/27/23  Yes Joshua Cathryne BROCKS, MD  pantoprazole  (PROTONIX ) 40 MG tablet TAKE 1 TABLET BY MOUTH DAILY 01/30/23  Yes Joshua Cathryne BROCKS, MD     ALLERGIES:  Allergies  Allergen Reactions   Sulfa  Antibiotics Other (See Comments)   Sulfamethoxazole-Trimethoprim Other (See Comments)    Other reaction(s): Unknown Other reaction(s): Unknown    Sulfasalazine Other (See Comments)     SOCIAL HISTORY:  Social History   Socioeconomic History   Marital status: Single    Spouse name: Not on file   Number of children: 3   Years of education: Not on file   Highest education level: 9th grade  Occupational History   Occupation: Retired  Tobacco Use   Smoking status: Former    Current packs/day: 0.00    Average packs/day: 1.5 packs/day for 30.0 years (45.0 ttl pk-yrs)    Types: Cigarettes    Start date: 87    Quit date: 1997    Years since quitting: 28.6   Smokeless tobacco: Former    Types: Chew   Tobacco comments:    smoking cessation materials not required  Vaping Use   Vaping status: Never Used  Substance and Sexual Activity   Alcohol use: No    Alcohol/week: 0.0 standard drinks of alcohol   Drug use: No   Sexual activity: Not Currently  Other Topics Concern   Not on file  Social History Narrative   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, lives alone   Social Drivers of Corporate investment banker Strain: Low Risk  (02/01/2022)   Overall Financial Resource Strain (CARDIA)    Difficulty of Paying Living Expenses: Not hard at all  Food Insecurity: No Food Insecurity (11/17/2023)   Hunger Vital Sign    Worried About Running Out of Food in the Last Year: Never true    Ran Out of Food in the Last Year: Never true  Transportation Needs: No Transportation Needs (11/17/2023)   PRAPARE - Administrator, Civil Service (Medical): No    Lack of Transportation (Non-Medical): No  Physical Activity: Inactive (06/21/2020)   Exercise Vital Sign    Days of Exercise per Week: 0 days    Minutes of Exercise per Session: 0 min  Stress: No Stress Concern Present (02/01/2022)   [employer] of Occupational Health - Occupational Stress Questionnaire    Feeling of  Stress : Not at all  Social Connections: Socially Isolated (11/17/2023)   Social Connection and Isolation Panel    Frequency of Communication with Friends and Family: Three times a week    Frequency of Social Gatherings with Friends and Family: Once a week    Attends Religious Services: Never    Database administrator or Organizations: No    Attends Banker Meetings: Never    Marital Status: Divorced  Catering manager Violence: Not At Risk (11/17/2023)   Humiliation, Afraid, Rape, and Kick questionnaire    Fear of Current or Ex-Partner: No    Emotionally Abused: No    Physically Abused: No    Sexually Abused: No     FAMILY HISTORY:  Family History  Problem Relation Age of Onset   Hypertension Father  Hypotension Mother       REVIEW OF SYSTEMS:  Review of Systems  Constitutional:  Negative for chills and fever.  Respiratory:  Negative for cough, shortness of breath and wheezing.   Cardiovascular:  Negative for chest pain and palpitations.  Gastrointestinal:  Positive for blood in stool. Negative for abdominal pain, nausea and vomiting.    VITAL SIGNS:  Temp:  [98 F (36.7 C)-100.4 F (38 C)] 98.2 F (36.8 C) (08/18 1128) Pulse Rate:  [68-93] 70 (08/18 1243) Resp:  [18-22] 18 (08/18 1128) BP: (122-170)/(57-74) 128/64 (08/18 1243) SpO2:  [93 %-100 %] 98 % (08/18 1128)     Height: 5' 6 (167.6 cm) Weight: 81.6 kg BMI (Calculated): 29.05   INTAKE/OUTPUT:  08/17 0701 - 08/18 0700 In: 1980.4 [P.O.:580; I.V.:1400.4] Out: 0   PHYSICAL EXAM:  Physical Exam Constitutional:      Appearance: He is well-developed.  HENT:     Head: Normocephalic and atraumatic.  Cardiovascular:     Rate and Rhythm: Normal rate and regular rhythm.  Pulmonary:     Effort: Pulmonary effort is normal.     Breath sounds: Normal breath sounds.  Abdominal:     Palpations: Abdomen is soft.     Tenderness: There is no abdominal tenderness.  Neurological:     Mental Status: He is  alert.     Labs:     Latest Ref Rng & Units 11/19/2023    4:09 AM 11/18/2023    8:20 PM 11/18/2023    6:02 AM  CBC  WBC 4.0 - 10.5 K/uL 11.7  11.3  6.2   Hemoglobin 13.0 - 17.0 g/dL 8.7  8.8  8.5   Hematocrit 39.0 - 52.0 % 30.1  29.7  29.5   Platelets 150 - 400 K/uL 293  309  283       Latest Ref Rng & Units 11/19/2023    4:09 AM 11/18/2023    6:02 AM 11/16/2023   10:29 PM  CMP  Glucose 70 - 99 mg/dL 890  89  91   BUN 8 - 23 mg/dL 13  13  21    Creatinine 0.61 - 1.24 mg/dL 8.73  8.71  8.82   Sodium 135 - 145 mmol/L 137  138  140   Potassium 3.5 - 5.1 mmol/L 3.8  3.9  3.9   Chloride 98 - 111 mmol/L 106  104  109   CO2 22 - 32 mmol/L 24  25  24    Calcium  8.9 - 10.3 mg/dL 9.0  9.0  9.2     Imaging studies:  CLINICAL DATA:  Likely malignant colon mass.  * Tracking Code: BO *   EXAM: CT CHEST, ABDOMEN, AND PELVIS WITH CONTRAST   TECHNIQUE: Multidetector CT imaging of the chest, abdomen and pelvis was performed following the standard protocol during bolus administration of intravenous contrast.   RADIATION DOSE REDUCTION: This exam was performed according to the departmental dose-optimization program which includes automated exposure control, adjustment of the mA and/or kV according to patient size and/or use of iterative reconstruction technique.   CONTRAST:  OMNIPAQUE  IOHEXOL  300 MG/ML  SOLN   COMPARISON:  CT chest 01/03/2013 in CT abdomen pelvis 01/19/2010.   FINDINGS: CT CHEST FINDINGS   Cardiovascular: Atherosclerotic calcification of the aorta, aortic valve and coronary arteries. Heart is at the upper limits of normal in size to mildly enlarged. No pericardial effusion.   Mediastinum/Nodes: Mediastinal lymph nodes measure up to 1.3 cm in the AP window. Right  hilar lymph node measures 12 mm. No axillary adenopathy. Esophagus is grossly unremarkable.   Lungs/Pleura: Centrilobular and paraseptal emphysema. Chronic bibasilar volume loss. New  peribronchovascular nodularity, ground-glass and consolidation in the left upper and left lower lobes. Calcified granulomas. No pleural fluid. Airway is unremarkable.   Musculoskeletal: Degenerative changes in the spine. No worrisome lytic or sclerotic lesions.   CT ABDOMEN PELVIS FINDINGS   Hepatobiliary: Liver and gallbladder are unremarkable. No biliary ductal dilatation.   Pancreas: Negative.   Spleen: Negative.   Adrenals/Urinary Tract: Adrenal glands are unremarkable. Small low-attenuation lesions in the kidneys, too small to characterize. No specific follow-up necessary. Small left renal stone. Ureters are decompressed. Bladder is grossly unremarkable.   Stomach/Bowel: Stomach and small bowel unremarkable. Appendix is not readily visualized. Irregular apple-core mass in the cecum and ascending colon measures approximately 8.2 cm in length (coronal image 44). Several small ileocolic mesenteric lymph nodes measure up to 8 mm. Colon is otherwise unremarkable.   Vascular/Lymphatic: Atherosclerotic calcification of the aorta. No additional pathologically enlarged lymph nodes.   Reproductive: Prostate is visualized.   Other: No free fluid. Mesenteries and peritoneum are otherwise unremarkable.   Musculoskeletal: Well-circumscribed lucent lesions in the iliac wings are unchanged from 01/19/2010 and considered benign. No worrisome lytic or sclerotic lesions.   IMPRESSION: 1. Cecal/ascending colon mass with small metastatic ileocolic mesenteric lymph nodes, compatible with primary colon carcinoma. No evidence of distant metastatic disease. 2. Left upper and left lower lobe pneumonia. Small to borderline enlarged mediastinal lymph nodes are likely reactive in etiology. 3. Small left renal stone. 4. Aortic atherosclerosis (ICD10-I70.0). Coronary artery calcification. 5. Emphysema (ICD10-J43.9).   Assessment/Plan: 78 y.o. male with likely malignant colon mass,  complicated by pertinent comorbidities including hypertension, hyperlipidemia, GERD, CKD, and CAD s/p CABG in 2012.    - Discussed likelihood of ascending colon mass being malignant. Recommend resecting that portion of colon. Plan for robotic assisted laparoscopic right colectomy with primary anastomosis. Discussed risk and benefits of surgery with patient. Patient was informed of risk of colostomy bag if anastomosis is not successful. Patient shows understanding and agrees to proceed with surgery later this week.   - Stable Hbg/Hct, continue to monitor   - Biopsy was taken during colonoscopy to confirm malignancy, results pending   - CEA in process  - Continue IV fluids and pain management  Thank you for the opportunity to participate in this patient's care.   -- Gilmer Cea PA-C

## 2023-11-19 NOTE — Consult Note (Addendum)
 [facility]  SURGICAL CONSULTATION NOTE    HISTORY OF PRESENT ILLNESS (HPI):  78 y.o. male presented to [facility] ED on 11/16/23 for evaluation of shortness of breath with exertion and dark stools. Patient states he has been noticing intermittent dark stools for the past 3 months. Has been taking Mobic  and baby aspirin  at home. Denies being on anticoagulations. Had a colonoscopy scheduled but he canceled the appointment. Last colonoscopy was on April 2019 which showed diverticulosis and external hemorrhoids but was otherwise normal.   Patient had been to doctors visit that morning due to feeling short of breath, patient had low Hbg on blood work and was advised to go to the ED for blood transfusion. In the ED, patient was afebrile, hypertensive with a BP of 165/70, HR of 66 and RR of 26.  Patient had a low Hbg of 7.4 with normal WBC and platelet count. He received 1 unit of PRBC in the ED that same day and Hbg improved to 8.4. Patient had an elevated creatinine level of 1.31. No electrolyte disturbance noted. Chest x-ray was obtained due to shortness of breath which showed small right pleural effusion. No other imaging was obtained.  GI was consulted for symptomatic anemia the following day and was scheduled for upper and lower endoscopy. Upper endoscopy showed gastritis and lower endoscopy showed partially obstructing tumor in the distal ascending colon and non-bleeding internal hemorrhoids.   Surgery is consulted by Dr. Kandis in this context for evaluation and management of likely malignant partially obstructing tumor of ascending colon.   PAST MEDICAL HISTORY (PMH):  Past Medical History:  Diagnosis Date   Arthritis    knee   Chronic kidney disease    GERD (gastroesophageal reflux disease)    Hyperlipidemia    Hypertension      PAST SURGICAL HISTORY (PSH):  Past Surgical History:  Procedure Laterality Date   CARDIAC SURGERY  03/2010   bypass   CERVICAL FUSION  12/11/2012    C4-5,C5-6,C6-7   COLONOSCOPY  2011 ?   COLONOSCOPY  11/18/2023   Procedure: COLONOSCOPY;  Surgeon: Onita Elspeth Sharper, DO;  Location: [facility] ENDOSCOPY;  Service: Gastroenterology;;   COLONOSCOPY WITH PROPOFOL  N/A 07/11/2017   Procedure: COLONOSCOPY WITH PROPOFOL ;  Surgeon: Unk Corinn Skiff, MD;  Location: [facility] SURGERY [facility];  Service: Endoscopy;  Laterality: N/A;   ESOPHAGOGASTRODUODENOSCOPY N/A 11/18/2023   Procedure: EGD (ESOPHAGOGASTRODUODENOSCOPY);  Surgeon: Onita Elspeth Sharper, DO;  Location: [facility] ENDOSCOPY;  Service: Gastroenterology;  Laterality: N/A;   POLYPECTOMY N/A 07/11/2017   Procedure: POLYPECTOMY;  Surgeon: Unk Corinn Skiff, MD;  Location: [facility] SURGERY [facility];  Service: Endoscopy;  Laterality: N/A;     MEDICATIONS:  Prior to Admission medications   Medication Sig Start Date End Date Taking? Authorizing Provider  amLODipine  (NORVASC ) 5 MG tablet Take 5 mg by mouth daily.   Yes [provider]  aspirin  EC 81 MG tablet Take 1 tablet (81 mg total) by mouth daily. 09/11/19  Yes Joshua Cathryne BROCKS, MD  atorvastatin  (LIPITOR ) 80 MG tablet TAKE 1 TABLET BY MOUTH ONCE  DAILY 04/25/23  Yes Jones, Deanna C, MD  Baclofen  5 MG TABS Take 1 tablet (5 mg total) by mouth 3 (three) times daily as needed. 11/01/23  Yes Alvia Selinda PARAS, MD  isosorbide  mononitrate (IMDUR ) 30 MG 24 hr tablet Take 30 mg by mouth daily.   Yes [provider]  metoprolol  tartrate (LOPRESSOR ) 25 MG tablet Take 0.5 tablets (12.5 mg total) by mouth 2 (two) times daily.  Patient taking differently: Take 25 mg by mouth 2 (two) times daily. 05/19/22  Yes Joshua Cathryne BROCKS, MD  nitroGLYCERIN  (NITROSTAT ) 0.4 MG SL tablet Place 1 tablet (0.4 mg total) under the tongue every 5 (five) minutes as needed for chest pain. 04/27/23  Yes Joshua Cathryne BROCKS, MD  pantoprazole  (PROTONIX ) 40 MG tablet TAKE 1 TABLET BY MOUTH DAILY 01/30/23  Yes Joshua Cathryne BROCKS, MD     ALLERGIES:  Allergies  Allergen Reactions   Sulfa  Antibiotics Other (See Comments)   Sulfamethoxazole-Trimethoprim Other (See Comments)    Other reaction(s): Unknown Other reaction(s): Unknown    Sulfasalazine Other (See Comments)     SOCIAL HISTORY:  Social History   Socioeconomic History   Marital status: Single    Spouse name: Not on file   Number of children: 3   Years of education: Not on file   Highest education level: 9th grade  Occupational History   Occupation: Retired  Tobacco Use   Smoking status: Former    Current packs/day: 0.00    Average packs/day: 1.5 packs/day for 30.0 years (45.0 ttl pk-yrs)    Types: Cigarettes    Start date: 87    Quit date: 1997    Years since quitting: 28.6   Smokeless tobacco: Former    Types: Chew   Tobacco comments:    smoking cessation materials not required  Vaping Use   Vaping status: Never Used  Substance and Sexual Activity   Alcohol use: No    Alcohol/week: 0.0 standard drinks of alcohol   Drug use: No   Sexual activity: Not Currently  Other Topics Concern   Not on file  Social History Narrative   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, lives alone   Social Drivers of Corporate investment banker Strain: Low Risk  (02/01/2022)   Overall Financial Resource Strain (CARDIA)    Difficulty of Paying Living Expenses: Not hard at all  Food Insecurity: No Food Insecurity (11/17/2023)   Hunger Vital Sign    Worried About Running Out of Food in the Last Year: Never true    Ran Out of Food in the Last Year: Never true  Transportation Needs: No Transportation Needs (11/17/2023)   PRAPARE - Administrator, Civil Service (Medical): No    Lack of Transportation (Non-Medical): No  Physical Activity: Inactive (06/21/2020)   Exercise Vital Sign    Days of Exercise per Week: 0 days    Minutes of Exercise per Session: 0 min  Stress: No Stress Concern Present (02/01/2022)   [employer] of Occupational Health - Occupational Stress Questionnaire    Feeling of  Stress : Not at all  Social Connections: Socially Isolated (11/17/2023)   Social Connection and Isolation Panel    Frequency of Communication with Friends and Family: Three times a week    Frequency of Social Gatherings with Friends and Family: Once a week    Attends Religious Services: Never    Database administrator or Organizations: No    Attends Banker Meetings: Never    Marital Status: Divorced  Catering manager Violence: Not At Risk (11/17/2023)   Humiliation, Afraid, Rape, and Kick questionnaire    Fear of Current or Ex-Partner: No    Emotionally Abused: No    Physically Abused: No    Sexually Abused: No     FAMILY HISTORY:  Family History  Problem Relation Age of Onset   Hypertension Father  Hypotension Mother       REVIEW OF SYSTEMS:  Review of Systems  Constitutional:  Negative for chills and fever.  Respiratory:  Negative for cough, shortness of breath and wheezing.   Cardiovascular:  Negative for chest pain and palpitations.  Gastrointestinal:  Positive for blood in stool. Negative for abdominal pain, nausea and vomiting.    VITAL SIGNS:  Temp:  [98 F (36.7 C)-100.4 F (38 C)] 98.2 F (36.8 C) (08/18 1128) Pulse Rate:  [68-93] 70 (08/18 1243) Resp:  [18-22] 18 (08/18 1128) BP: (122-170)/(57-74) 128/64 (08/18 1243) SpO2:  [93 %-100 %] 98 % (08/18 1128)     Height: 5' 6 (167.6 cm) Weight: 81.6 kg BMI (Calculated): 29.05   INTAKE/OUTPUT:  08/17 0701 - 08/18 0700 In: 1980.4 [P.O.:580; I.V.:1400.4] Out: 0   PHYSICAL EXAM:  Physical Exam Constitutional:      Appearance: He is well-developed.  HENT:     Head: Normocephalic and atraumatic.  Cardiovascular:     Rate and Rhythm: Normal rate and regular rhythm.  Pulmonary:     Effort: Pulmonary effort is normal.     Breath sounds: Normal breath sounds.  Abdominal:     Palpations: Abdomen is soft.     Tenderness: There is no abdominal tenderness.  Neurological:     Mental Status: He is  alert.     Labs:     Latest Ref Rng & Units 11/19/2023    4:09 AM 11/18/2023    8:20 PM 11/18/2023    6:02 AM  CBC  WBC 4.0 - 10.5 K/uL 11.7  11.3  6.2   Hemoglobin 13.0 - 17.0 g/dL 8.7  8.8  8.5   Hematocrit 39.0 - 52.0 % 30.1  29.7  29.5   Platelets 150 - 400 K/uL 293  309  283       Latest Ref Rng & Units 11/19/2023    4:09 AM 11/18/2023    6:02 AM 11/16/2023   10:29 PM  CMP  Glucose 70 - 99 mg/dL 890  89  91   BUN 8 - 23 mg/dL 13  13  21    Creatinine 0.61 - 1.24 mg/dL 8.73  8.71  8.82   Sodium 135 - 145 mmol/L 137  138  140   Potassium 3.5 - 5.1 mmol/L 3.8  3.9  3.9   Chloride 98 - 111 mmol/L 106  104  109   CO2 22 - 32 mmol/L 24  25  24    Calcium  8.9 - 10.3 mg/dL 9.0  9.0  9.2     Imaging studies:  CLINICAL DATA:  Likely malignant colon mass.  * Tracking Code: BO *   EXAM: CT CHEST, ABDOMEN, AND PELVIS WITH CONTRAST   TECHNIQUE: Multidetector CT imaging of the chest, abdomen and pelvis was performed following the standard protocol during bolus administration of intravenous contrast.   RADIATION DOSE REDUCTION: This exam was performed according to the departmental dose-optimization program which includes automated exposure control, adjustment of the mA and/or kV according to patient size and/or use of iterative reconstruction technique.   CONTRAST:  OMNIPAQUE  IOHEXOL  300 MG/ML  SOLN   COMPARISON:  CT chest 01/03/2013 in CT abdomen pelvis 01/19/2010.   FINDINGS: CT CHEST FINDINGS   Cardiovascular: Atherosclerotic calcification of the aorta, aortic valve and coronary arteries. Heart is at the upper limits of normal in size to mildly enlarged. No pericardial effusion.   Mediastinum/Nodes: Mediastinal lymph nodes measure up to 1.3 cm in the AP window. Right  hilar lymph node measures 12 mm. No axillary adenopathy. Esophagus is grossly unremarkable.   Lungs/Pleura: Centrilobular and paraseptal emphysema. Chronic bibasilar volume loss. New  peribronchovascular nodularity, ground-glass and consolidation in the left upper and left lower lobes. Calcified granulomas. No pleural fluid. Airway is unremarkable.   Musculoskeletal: Degenerative changes in the spine. No worrisome lytic or sclerotic lesions.   CT ABDOMEN PELVIS FINDINGS   Hepatobiliary: Liver and gallbladder are unremarkable. No biliary ductal dilatation.   Pancreas: Negative.   Spleen: Negative.   Adrenals/Urinary Tract: Adrenal glands are unremarkable. Small low-attenuation lesions in the kidneys, too small to characterize. No specific follow-up necessary. Small left renal stone. Ureters are decompressed. Bladder is grossly unremarkable.   Stomach/Bowel: Stomach and small bowel unremarkable. Appendix is not readily visualized. Irregular apple-core mass in the cecum and ascending colon measures approximately 8.2 cm in length (coronal image 44). Several small ileocolic mesenteric lymph nodes measure up to 8 mm. Colon is otherwise unremarkable.   Vascular/Lymphatic: Atherosclerotic calcification of the aorta. No additional pathologically enlarged lymph nodes.   Reproductive: Prostate is visualized.   Other: No free fluid. Mesenteries and peritoneum are otherwise unremarkable.   Musculoskeletal: Well-circumscribed lucent lesions in the iliac wings are unchanged from 01/19/2010 and considered benign. No worrisome lytic or sclerotic lesions.   IMPRESSION: 1. Cecal/ascending colon mass with small metastatic ileocolic mesenteric lymph nodes, compatible with primary colon carcinoma. No evidence of distant metastatic disease. 2. Left upper and left lower lobe pneumonia. Small to borderline enlarged mediastinal lymph nodes are likely reactive in etiology. 3. Small left renal stone. 4. Aortic atherosclerosis (ICD10-I70.0). Coronary artery calcification. 5. Emphysema (ICD10-J43.9).   Assessment/Plan: 78 y.o. male with likely malignant colon mass,  complicated by pertinent comorbidities including hypertension, hyperlipidemia, GERD, CKD, and CAD s/p CABG in 2012.    - Discussed likelihood of ascending colon mass being malignant. Recommend resecting that portion of colon. Plan for robotic assisted laparoscopic right colectomy with primary anastomosis. Discussed risk and benefits of surgery with patient. Patient was informed of risk of colostomy bag if anastomosis is not successful. Patient shows understanding and agrees to proceed with surgery later this week.   - Stable Hbg/Hct, continue to monitor   - Biopsy was taken during colonoscopy to confirm malignancy, results pending   - CEA in process  - Continue IV fluids and pain management  Thank you for the opportunity to participate in this patient's care.   -- Gilmer Cea PA-C

## 2023-11-19 NOTE — Plan of Care (Signed)

## 2023-11-20 DIAGNOSIS — I10 Essential (primary) hypertension: Secondary | ICD-10-CM

## 2023-11-20 DIAGNOSIS — I5041 Acute combined systolic (congestive) and diastolic (congestive) heart failure: Secondary | ICD-10-CM | POA: Diagnosis not present

## 2023-11-20 DIAGNOSIS — D62 Acute posthemorrhagic anemia: Secondary | ICD-10-CM | POA: Diagnosis not present

## 2023-11-20 DIAGNOSIS — K6389 Other specified diseases of intestine: Secondary | ICD-10-CM | POA: Diagnosis not present

## 2023-11-20 DIAGNOSIS — I25810 Atherosclerosis of coronary artery bypass graft(s) without angina pectoris: Secondary | ICD-10-CM

## 2023-11-20 LAB — SURGICAL PATHOLOGY

## 2023-11-20 LAB — CBC
HCT: 30.9 % — ABNORMAL LOW (ref 39.0–52.0)
Hemoglobin: 8.8 g/dL — ABNORMAL LOW (ref 13.0–17.0)
MCH: 20 pg — ABNORMAL LOW (ref 26.0–34.0)
MCHC: 28.5 g/dL — ABNORMAL LOW (ref 30.0–36.0)
MCV: 70.2 fL — ABNORMAL LOW (ref 80.0–100.0)
Platelets: 289 K/uL (ref 150–400)
RBC: 4.4 MIL/uL (ref 4.22–5.81)
RDW: 22.5 % — ABNORMAL HIGH (ref 11.5–15.5)
WBC: 9.7 K/uL (ref 4.0–10.5)
nRBC: 0 % (ref 0.0–0.2)

## 2023-11-20 LAB — BASIC METABOLIC PANEL WITH GFR
Anion gap: 7 (ref 5–15)
BUN: 16 mg/dL (ref 8–23)
CO2: 24 mmol/L (ref 22–32)
Calcium: 9.1 mg/dL (ref 8.9–10.3)
Chloride: 104 mmol/L (ref 98–111)
Creatinine, Ser: 1.25 mg/dL — ABNORMAL HIGH (ref 0.61–1.24)
GFR, Estimated: 59 mL/min — ABNORMAL LOW (ref >60)
Glucose, Bld: 102 mg/dL — ABNORMAL HIGH (ref 70–99)
Potassium: 4 mmol/L (ref 3.5–5.1)
Sodium: 135 mmol/L (ref 135–145)

## 2023-11-20 LAB — CEA: CEA: 21.2 ng/mL — ABNORMAL HIGH (ref 0.0–4.7)

## 2023-11-20 MED ORDER — SODIUM CHLORIDE 0.9 % IV SOLN
300.0000 mg | INTRAVENOUS | Status: AC
  Administered 2023-11-20 – 2023-11-22 (×3): 300 mg via INTRAVENOUS
  Filled 2023-11-20 (×3): qty 300

## 2023-11-20 MED ORDER — ORAL CARE MOUTH RINSE: 15.0000 mL | OROMUCOSAL | Status: DC | PRN

## 2023-11-20 MED ORDER — PEG 3350-KCL-NA BICARB-NACL 420 G PO SOLR
2000.0000 mL | Freq: Once | ORAL | Status: DC | PRN
  Filled 2023-11-20: qty 4000

## 2023-11-20 NOTE — Consult Note (Signed)
 [facility] CONSULT NOTE  Patient Care Team: Kotturi, Vinay K, MD as PCP - General (Family Medicine)  CHIEF COMPLAINTS/PURPOSE OF CONSULTATION: Colon cancer  Oncology History   No history exists.    HISTORY OF PRESENTING ILLNESS:   Matthew Humphrey 78 y.o.  male pleasant patient with a history of CAD on aspirin  s/p CABG 2012 CKD-is continued hospital for worsening shortness of breath.  Of note patient did admit to darker stools intermittently.    Patient on admission noted to have a hemoglobin of 7.4.  Patient noted to have microcytic anemia.  Patient s/p PRBC transfusion. Patient admits to prior colonoscopy in 2019.  Patient underwent a colonoscopy that showed a nonobstructing cecal mass-biopsy positive for adenocarcinoma.  CT scan showed no evidence of any distant metastatic disease-although showed localized adenopathy.  Patient is currently awaiting Surgery  Review of Systems  Constitutional:  Positive for malaise/fatigue. Negative for chills, diaphoresis, fever and weight loss.  HENT:  Negative for nosebleeds and sore throat.   Eyes:  Negative for double vision.  Respiratory:  Negative for cough, hemoptysis, sputum production, shortness of breath and wheezing.   Cardiovascular:  Negative for chest pain, palpitations, orthopnea and leg swelling.  Gastrointestinal:  Negative for abdominal pain, blood in stool, constipation, diarrhea, heartburn, melena, nausea and vomiting.  Genitourinary:  Negative for dysuria, frequency and urgency.  Musculoskeletal:  Positive for back pain and joint pain.  Skin: Negative.  Negative for itching and rash.  Neurological:  Negative for dizziness, tingling, focal weakness, weakness and headaches.  Endo/Heme/Allergies:  Does not bruise/bleed easily.  Psychiatric/Behavioral:  Negative for depression. The patient is not nervous/anxious and does not have insomnia.     MEDICAL HISTORY:  Past Medical History:  Diagnosis Date    Arthritis    knee   Chronic kidney disease    GERD (gastroesophageal reflux disease)    Hyperlipidemia    Hypertension     SURGICAL HISTORY: Past Surgical History:  Procedure Laterality Date   CARDIAC SURGERY  03/2010   bypass   CERVICAL FUSION  12/11/2012   C4-5,C5-6,C6-7   COLONOSCOPY  2011 ?   COLONOSCOPY  11/18/2023   Procedure: COLONOSCOPY;  Surgeon: Onita Elspeth Sharper, DO;  Location: [facility] ENDOSCOPY;  Service: Gastroenterology;;   COLONOSCOPY WITH PROPOFOL  N/A 07/11/2017   Procedure: COLONOSCOPY WITH PROPOFOL ;  Surgeon: Unk Corinn Skiff, MD;  Location: [facility] SURGERY [facility];  Service: Endoscopy;  Laterality: N/A;   ESOPHAGOGASTRODUODENOSCOPY N/A 11/18/2023   Procedure: EGD (ESOPHAGOGASTRODUODENOSCOPY);  Surgeon: Onita Elspeth Sharper, DO;  Location: [facility] ENDOSCOPY;  Service: Gastroenterology;  Laterality: N/A;   POLYPECTOMY N/A 07/11/2017   Procedure: POLYPECTOMY;  Surgeon: Unk Corinn Skiff, MD;  Location: [facility] SURGERY [facility];  Service: Endoscopy;  Laterality: N/A;    SOCIAL HISTORY: Social History   Socioeconomic History   Marital status: Single    Spouse name: Not on file   Number of children: 3   Years of education: Not on file   Highest education level: 9th grade  Occupational History   Occupation: Retired  Tobacco Use   Smoking status: Former    Current packs/day: 0.00    Average packs/day: 1.5 packs/day for 30.0 years (45.0 ttl pk-yrs)    Types: Cigarettes    Start date: 70    Quit date: 1997    Years since quitting: 28.6   Smokeless tobacco: Former    Types: Chew   Tobacco comments:    smoking cessation materials not required  [employer]  Vaping status: Never Used  Substance and Sexual Activity   Alcohol use: No    Alcohol/week: 0.0 standard drinks of alcohol   Drug use: No   Sexual activity: Not Currently  Other Topics Concern   Not on file  Social History Narrative   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, lives alone   Social  Drivers of Corporate investment banker Strain: Low Risk  (02/01/2022)   Overall Financial Resource Strain (CARDIA)    Difficulty of Paying Living Expenses: Not hard at all  Food Insecurity: No Food Insecurity (11/17/2023)   Hunger Vital Sign    Worried About Running Out of Food in the Last Year: Never true    Ran Out of Food in the Last Year: Never true  Transportation Needs: No Transportation Needs (11/17/2023)   PRAPARE - Administrator, Civil Service (Medical): No    Lack of Transportation (Non-Medical): No  Physical Activity: Inactive (06/21/2020)   Exercise Vital Sign    Days of Exercise per Week: 0 days    Minutes of Exercise per Session: 0 min  Stress: No Stress Concern Present (02/01/2022)   [employer] of Occupational Health - Occupational Stress Questionnaire    Feeling of Stress : Not at all  Social Connections: Socially Isolated (11/17/2023)   Social Connection and Isolation Panel    Frequency of Communication with Friends and Family: Three times a week    Frequency of Social Gatherings with Friends and Family: Once a week    Attends Religious Services: Never    Database administrator or Organizations: No    Attends Banker Meetings: Never    Marital Status: Divorced  Catering manager Violence: Not At Risk (11/17/2023)   Humiliation, Afraid, Rape, and Kick questionnaire    Fear of Current or Ex-Partner: No    Emotionally Abused: No    Physically Abused: No    Sexually Abused: No    FAMILY HISTORY: Family History  Problem Relation Age of Onset   Hypertension Father    Hypotension Mother     ALLERGIES:  is allergic to sulfa antibiotics, sulfamethoxazole-trimethoprim, and sulfasalazine.  MEDICATIONS:  Current Facility-Administered Medications  Medication Dose Route Frequency Provider Last Rate Last Admin   acetaminophen  (TYLENOL ) tablet 650 mg  650 mg Oral Q4H PRN Pratt, Tanya S, MD   650 mg at 11/19/23 2353   amLODipine  (NORVASC )  tablet 5 mg  5 mg Oral Daily Pratt, Tanya S, MD   5 mg at 11/20/23 1115   atorvastatin  (LIPITOR ) tablet 80 mg  80 mg Oral Daily Pratt, Tanya S, MD   80 mg at 11/20/23 1116   baclofen  (LIORESAL ) tablet 5 mg  5 mg Oral TID PRN Pratt, Tanya S, MD   5 mg at 11/19/23 2353   [START ON 11/21/2023] cefoTEtan  (CEFOTAN ) 1 g in sodium chloride  0.9 % 100 mL IVPB  1 g Intravenous Q12H Sakai, Isami, DO       ferrous sulfate  tablet 325 mg  325 mg Oral QODAY Danford, Lonni SQUIBB, MD   325 mg at 11/19/23 1726   isosorbide  mononitrate (IMDUR ) 24 hr tablet 30 mg  30 mg Oral Daily Pratt, Tanya S, MD   30 mg at 11/20/23 1116   metoprolol  tartrate (LOPRESSOR ) tablet 12.5 mg  12.5 mg Oral BID Pratt, Tanya S, MD   12.5 mg at 11/20/23 1115   metroNIDAZOLE  (FLAGYL ) tablet 500 mg  500 mg Oral Q4H Sakai, Isami,  DO   500 mg at 11/20/23 1507   neomycin  (MYCIFRADIN ) tablet 500 mg  500 mg Oral Q4H Sakai, Isami, DO   500 mg at 11/20/23 1507   nitroGLYCERIN  (NITROSTAT ) SL tablet 0.4 mg  0.4 mg Sublingual Q5 min PRN Pratt, Tanya S, MD   0.4 mg at 11/18/23 2033   ondansetron  (ZOFRAN ) injection 4 mg  4 mg Intravenous Q6H PRN Pratt, Tanya S, MD       pantoprazole  (PROTONIX ) EC tablet 40 mg  40 mg Oral Daily Kandis Devaughn Sayres, MD   40 mg at 11/20/23 1116   simethicone  (MYLICON) chewable tablet 80 mg  80 mg Oral QID Sakai, Isami, DO        PHYSICAL EXAMINATION:   Vitals:   11/20/23 0758 11/20/23 1115  BP: 137/87 137/87  Pulse: 88 70  Resp: 20 20  Temp: 98.7 F (37.1 C) 98 F (36.7 C)  SpO2: 95% 99%   Filed Weights   11/16/23 1334 11/18/23 0917  Weight: 180 lb (81.6 kg) 179 lb 14.3 oz (81.6 kg)    Physical Exam Vitals and nursing note reviewed.  HENT:     Head: Normocephalic and atraumatic.     Mouth/Throat:     Pharynx: Oropharynx is clear.  Eyes:     Extraocular Movements: Extraocular movements intact.     Pupils: Pupils are equal, round, and reactive to light.  Cardiovascular:     Rate and Rhythm: Normal rate  and regular rhythm.  Pulmonary:     Comments: Decreased breath sounds bilaterally.  Abdominal:     Palpations: Abdomen is soft.  Musculoskeletal:        General: Normal range of motion.     Cervical back: Normal range of motion.  Skin:    General: Skin is warm.  Neurological:     General: No focal deficit present.     Mental Status: He is alert and oriented to person, place, and time.  Psychiatric:        Behavior: Behavior normal.        Judgment: Judgment normal.     LABORATORY DATA:  I have reviewed the data as listed Lab Results  Component Value Date   WBC 9.7 11/20/2023   HGB 8.8 (L) 11/20/2023   HCT 30.9 (L) 11/20/2023   MCV 70.2 (L) 11/20/2023   PLT 289 11/20/2023   Recent Labs    11/16/23 0923 11/16/23 1338 11/18/23 0602 11/19/23 0409 11/20/23 0356  NA 140   < > 138 137 135  K 4.0   < > 3.9 3.8 4.0  CL 107   < > 104 106 104  CO2 25   < > 25 24 24   GLUCOSE 80   < > 89 109* 102*  BUN 23   < > 13 13 16   CREATININE 1.31*   < > 1.28* 1.26* 1.25*  CALCIUM  9.2   < > 9.0 9.0 9.1  GFRNONAA 56*   < > 58* 59* 59*  PROT 6.6  --   --   --   --   ALBUMIN 3.1*  --   --   --   --   AST 41  --   --   --   --   ALT 43  --   --   --   --   ALKPHOS 76  --   --   --   --   BILITOT 0.4  --   --   --   --    < > =  values in this interval not displayed.    RADIOGRAPHIC STUDIES: I have personally reviewed the radiological images as listed and agreed with the findings in the report. CT CHEST ABDOMEN PELVIS W CONTRAST Result Date: 11/19/2023 CLINICAL DATA:  Likely malignant colon mass.  * Tracking Code: BO * EXAM: CT CHEST, ABDOMEN, AND PELVIS WITH CONTRAST TECHNIQUE: Multidetector CT imaging of the chest, abdomen and pelvis was performed following the standard protocol during bolus administration of intravenous contrast. RADIATION DOSE REDUCTION: This exam was performed according to the departmental dose-optimization program which includes automated exposure control, adjustment  of the mA and/or kV according to patient size and/or use of iterative reconstruction technique. CONTRAST:  OMNIPAQUE  IOHEXOL  300 MG/ML  SOLN COMPARISON:  CT chest 01/03/2013 in CT abdomen pelvis 01/19/2010. FINDINGS: CT CHEST FINDINGS Cardiovascular: Atherosclerotic calcification of the aorta, aortic valve and coronary arteries. Heart is at the upper limits of normal in size to mildly enlarged. No pericardial effusion. Mediastinum/Nodes: Mediastinal lymph nodes measure up to 1.3 cm in the AP window. Right hilar lymph node measures 12 mm. No axillary adenopathy. Esophagus is grossly unremarkable. Lungs/Pleura: Centrilobular and paraseptal emphysema. Chronic bibasilar volume loss. New peribronchovascular nodularity, ground-glass and consolidation in the left upper and left lower lobes. Calcified granulomas. No pleural fluid. Airway is unremarkable. Musculoskeletal: Degenerative changes in the spine. No worrisome lytic or sclerotic lesions. CT ABDOMEN PELVIS FINDINGS Hepatobiliary: Liver and gallbladder are unremarkable. No biliary ductal dilatation. Pancreas: Negative. Spleen: Negative. Adrenals/Urinary Tract: Adrenal glands are unremarkable. Small low-attenuation lesions in the kidneys, too small to characterize. No specific follow-up necessary. Small left renal stone. Ureters are decompressed. Bladder is grossly unremarkable. Stomach/Bowel: Stomach and small bowel unremarkable. Appendix is not readily visualized. Irregular apple-core mass in the cecum and ascending colon measures approximately 8.2 cm in length (coronal image 44). Several small ileocolic mesenteric lymph nodes measure up to 8 mm. Colon is otherwise unremarkable. Vascular/Lymphatic: Atherosclerotic calcification of the aorta. No additional pathologically enlarged lymph nodes. Reproductive: Prostate is visualized. Other: No free fluid. Mesenteries and peritoneum are otherwise unremarkable. Musculoskeletal: Well-circumscribed lucent lesions in the  iliac wings are unchanged from 01/19/2010 and considered benign. No worrisome lytic or sclerotic lesions. IMPRESSION: 1. Cecal/ascending colon mass with small metastatic ileocolic mesenteric lymph nodes, compatible with primary colon carcinoma. No evidence of distant metastatic disease. 2. Left upper and left lower lobe pneumonia. Small to borderline enlarged mediastinal lymph nodes are likely reactive in etiology. 3. Small left renal stone. 4. Aortic atherosclerosis (ICD10-I70.0). Coronary artery calcification. 5. Emphysema (ICD10-J43.9). Electronically Signed   By: Newell Eke M.D.   On: 11/19/2023 14:16   ECHOCARDIOGRAM COMPLETE Result Date: 11/17/2023    ECHOCARDIOGRAM REPORT   Patient Name:   Matthew Humphrey Date of Exam: 11/17/2023 Medical Rec #:  [id number]          Height:       66.0 in Accession #:    [id number]         Weight:       180.0 lb Date of Birth:  12/22/1945           BSA:          1.912 m Patient Age:    78 years           BP:           129/64 mmHg Patient Gender: M                  HR:  57 bpm. Exam Location:  [facility] Procedure: 2D Echo, Cardiac Doppler and Color Doppler (Both Spectral and Color            Flow Doppler were utilized during procedure). Indications:     Dyspnea R06.00  History:         Patient has prior history of Echocardiogram examinations. Risk                  Factors:Hypertension.  Sonographer:     Bari Roar Referring Phys:  [id number] CARALYN HUDSON Diagnosing Phys: Cara JONETTA Lovelace MD IMPRESSIONS  1. Inferior posterior septal hypo.  2. Left ventricular ejection fraction, by estimation, is 45 to 50%. The left ventricle has mildly decreased function. The left ventricle demonstrates regional wall motion abnormalities (see scoring diagram/findings for description). Left ventricular diastolic parameters are consistent with Grade I diastolic dysfunction (impaired relaxation).  3. Right ventricular systolic function is normal. The right ventricular size is normal.   4. The mitral valve is normal in structure. Mild mitral valve regurgitation.  5. The aortic valve is normal in structure. Aortic valve regurgitation is not visualized. FINDINGS  Left Ventricle: Left ventricular ejection fraction, by estimation, is 45 to 50%. The left ventricle has mildly decreased function. The left ventricle demonstrates regional wall motion abnormalities. Strain was performed and the global longitudinal strain is indeterminate. The left ventricular internal cavity size was normal in size. There is borderline concentric left ventricular hypertrophy. Left ventricular diastolic parameters are consistent with Grade I diastolic dysfunction (impaired relaxation). Right Ventricle: The right ventricular size is normal. No increase in right ventricular wall thickness. Right ventricular systolic function is normal. Left Atrium: Left atrial size was normal in size. Right Atrium: Right atrial size was normal in size. Pericardium: There is no evidence of pericardial effusion. Mitral Valve: The mitral valve is normal in structure. Mild mitral valve regurgitation. MV peak gradient, 4.2 mmHg. The mean mitral valve gradient is 1.0 mmHg. Tricuspid Valve: The tricuspid valve is normal in structure. Tricuspid valve regurgitation is trivial. Aortic Valve: The aortic valve is normal in structure. Aortic valve regurgitation is not visualized. Aortic valve mean gradient measures 2.0 mmHg. Aortic valve peak gradient measures 4.8 mmHg. Aortic valve area, by VTI measures 2.72 cm. Pulmonic Valve: The pulmonic valve was normal in structure. Pulmonic valve regurgitation is not visualized. Aorta: The ascending aorta was not well visualized. IAS/Shunts: No atrial level shunt detected by color flow Doppler. Additional Comments: Inferior posterior septal hypo. 3D was performed not requiring image post processing on an independent workstation and was indeterminate.  LEFT VENTRICLE PLAX 2D LVIDd:         4.30 cm     Diastology  LVIDs:         3.20 cm     LV e' medial:    5.11 cm/s LV PW:         1.00 cm     LV E/e' medial:  16.9 LV IVS:        1.30 cm     LV e' lateral:   10.60 cm/s LVOT diam:     1.80 cm     LV E/e' lateral: 8.1 LV SV:         51 LV SV Index:   27 LVOT Area:     2.54 cm  LV Volumes (MOD) LV vol d, MOD A2C: 78.1 ml LV vol d, MOD A4C: 66.1 ml LV vol s, MOD A2C: 42.0 ml LV vol s, MOD A4C: 34.7  ml LV SV MOD A2C:     36.1 ml LV SV MOD A4C:     66.1 ml LV SV MOD BP:      34.4 ml RIGHT VENTRICLE RV Basal diam:  2.80 cm RV Mid diam:    2.30 cm RV S prime:     8.38 cm/s TAPSE (M-mode): 1.4 cm LEFT ATRIUM             Index        RIGHT ATRIUM           Index LA diam:        3.90 cm 2.04 cm/m   RA Area:     12.20 cm LA Vol (A2C):   71.0 ml 37.13 ml/m  RA Volume:   25.40 ml  13.28 ml/m LA Vol (A4C):   51.6 ml 26.98 ml/m LA Biplane Vol: 61.9 ml 32.37 ml/m  AORTIC VALVE                    PULMONIC VALVE AV Area (Vmax):    2.17 cm     PV Vmax:        0.87 m/s AV Area (Vmean):   2.28 cm     PV Peak grad:   3.0 mmHg AV Area (VTI):     2.72 cm     RVOT Peak grad: 2 mmHg AV Vmax:           109.00 cm/s AV Vmean:          69.300 cm/s AV VTI:            0.188 m AV Peak Grad:      4.8 mmHg AV Mean Grad:      2.0 mmHg LVOT Vmax:         92.80 cm/s LVOT Vmean:        62.100 cm/s LVOT VTI:          0.201 m LVOT/AV VTI ratio: 1.07  AORTA Ao Root diam: 2.40 cm Ao Asc diam:  3.00 cm MITRAL VALVE                TRICUSPID VALVE MV Area (PHT): 4.77 cm     TR Peak grad:   19.0 mmHg MV Area VTI:   1.84 cm     TR Vmax:        218.00 cm/s MV Peak grad:  4.2 mmHg MV Mean grad:  1.0 mmHg     SHUNTS MV Vmax:       1.02 m/s     Systemic VTI:  0.20 m MV Vmean:      51.1 cm/s    Systemic Diam: 1.80 cm MV Decel Time: 159 msec MV E velocity: 86.20 cm/s MV A velocity: 100.00 cm/s MV E/A ratio:  0.86 MV A Prime:    12.4 cm/s Cara JONETTA Lovelace MD Electronically signed by Cara JONETTA Lovelace MD Signature Date/Time: 11/17/2023/1:15:33 PM    Final    DG Chest  1 View Result Date: 11/16/2023 CLINICAL DATA:  Shortness of breath EXAM: CHEST  1 VIEW COMPARISON:  11/16/2023 FINDINGS: Prior CABG. Heart and mediastinal contours within normal limits. Left lung clear. Right basilar opacity is similar to prior study and could reflect atelectasis or infiltrate. Small right pleural effusions suspected. No acute bony abnormality. IMPRESSION: Right basilar airspace opacity likely reflects atelectasis or infiltrate/pneumonia. Small right pleural effusion. Electronically Signed   By: Franky Crease M.D.   On: 11/16/2023 19:25   DG  Chest 2 View Result Date: 11/16/2023 CLINICAL DATA:  Shortness of breath. EXAM: CHEST - 2 VIEW COMPARISON:  01/03/2013. FINDINGS: The heart size and mediastinal contours are within normal limits. Prior median sternotomy and CABG. Streaky bibasilar scarring/subsegmental atelectasis. Suspected small right pleural effusion. No focal consolidation or pneumothorax. Cervical fusion hardware. No acute osseous abnormality. IMPRESSION: 1. Suspected small right pleural effusion. 2.  Streaky bibasilar scarring/subsegmental atelectasis. Electronically Signed   By: Harrietta Sherry M.D.   On: 11/16/2023 10:55   78 year old patient with a history of CAD on aspirin  s/p CABG 2012 CKD-is continued hospital for worsening shortness of breath/symptomatic anemia-noted to have colonic mass  # Right colon mass-status post colonoscopy- adenocarcinoma.  CT scan abdomen pelvis-shows no obstruction no evidence of metastatic disease; showed ileocolic mesenteric adenopathy.  Clinically stage III.  # Iron  deficient anemia secondary #1  # CAD-clinically stable.  # Recommendation/plan:  # Agree with hemicolectomy as planned on 8/20.  Check preop CEA.  # Await postsurgical pathology to discuss adjuvant treatment options.    # Recommend IV iron  infusion while in hospital.  Thank you Dr.Patel  for allowing me to participate in the care of your pleasant patient. Please do not  hesitate to contact me with questions or concerns in the interim.   Above plan of care was discussed with patient in detail.  My contact information was given to the patient.    Cindy JONELLE Joe, MD 11/20/2023 4:19 PM

## 2023-11-20 NOTE — Plan of Care (Signed)
  Problem: Activity: Goal: Ability to tolerate increased activity will improve Outcome: Progressing   Problem: Education: Goal: Knowledge of General Education information will improve Description: Including pain rating scale, medication(s)/side effects and non-pharmacologic comfort measures Outcome: Progressing   Problem: Health Behavior/Discharge Planning: Goal: Ability to manage health-related needs will improve Outcome: Progressing   Problem: Clinical Measurements: Goal: Cardiovascular complication will be avoided Outcome: Progressing   Problem: Coping: Goal: Level of anxiety will decrease Outcome: Progressing   Problem: Elimination: Goal: Will not experience complications related to bowel motility Outcome: Progressing Goal: Will not experience complications related to urinary retention Outcome: Progressing   Problem: Safety: Goal: Ability to remain free from injury will improve Outcome: Progressing

## 2023-11-20 NOTE — Progress Notes (Addendum)
 [facility]  - [place] at [facility]   PATIENT NAME: Matthew Humphrey    MR#:  [id number]  DATE OF BIRTH:  28-Jun-1945  SUBJECTIVE:  patient sitting out in the recliner chair. Patient sister and other family members at bedside. Questions answered. Overall remains stable. He understands and agrees with plan for: mass surgery tomorrow. Denies chest pain and remains in sinus rhythm.   VITALS:  Blood pressure 137/87, pulse 70, temperature 98 F (36.7 C), temperature source Oral, resp. rate 20, height 5' 6 (1.676 m), weight 81.6 kg, SpO2 99%.  PHYSICAL EXAMINATION:   GENERAL:  78 y.o.-year-old patient with no acute distress.  LUNGS: Normal breath sounds bilaterally, no wheezing CARDIOVASCULAR: S1, S2 normal. No murmur   ABDOMEN: Soft, nontender, nondistended. Bowel sounds present.  EXTREMITIES: No  edema b/l.    NEUROLOGIC: nonfocal  patient is alert and awake  LABORATORY PANEL:  CBC Recent Labs  Lab 11/20/23 0356  WBC 9.7  HGB 8.8*  HCT 30.9*  PLT 289    Chemistries  Recent Labs  Lab 11/16/23 0923 11/16/23 1338 11/20/23 0356  NA 140   < > 135  K 4.0   < > 4.0  CL 107   < > 104  CO2 25   < > 24  GLUCOSE 80   < > 102*  BUN 23   < > 16  CREATININE 1.31*   < > 1.25*  CALCIUM  9.2   < > 9.1  AST 41  --   --   ALT 43  --   --   ALKPHOS 76  --   --   BILITOT 0.4  --   --    < > = values in this interval not displayed.   Cardiac Enzymes No results for input(s): TROPONINI in the last 168 hours. RADIOLOGY:  CT CHEST ABDOMEN PELVIS W CONTRAST Result Date: 11/19/2023 CLINICAL DATA:  Likely malignant colon mass.  * Tracking Code: BO * EXAM: CT CHEST, ABDOMEN, AND PELVIS WITH CONTRAST TECHNIQUE: Multidetector CT imaging of the chest, abdomen and pelvis was performed following the standard protocol during bolus administration of intravenous contrast. RADIATION DOSE REDUCTION: This exam was performed according to the departmental dose-optimization program which  includes automated exposure control, adjustment of the mA and/or kV according to patient size and/or use of iterative reconstruction technique. CONTRAST:  OMNIPAQUE  IOHEXOL  300 MG/ML  SOLN COMPARISON:  CT chest 01/03/2013 in CT abdomen pelvis 01/19/2010. FINDINGS: CT CHEST FINDINGS Cardiovascular: Atherosclerotic calcification of the aorta, aortic valve and coronary arteries. Heart is at the upper limits of normal in size to mildly enlarged. No pericardial effusion. Mediastinum/Nodes: Mediastinal lymph nodes measure up to 1.3 cm in the AP window. Right hilar lymph node measures 12 mm. No axillary adenopathy. Esophagus is grossly unremarkable. Lungs/Pleura: Centrilobular and paraseptal emphysema. Chronic bibasilar volume loss. New peribronchovascular nodularity, ground-glass and consolidation in the left upper and left lower lobes. Calcified granulomas. No pleural fluid. Airway is unremarkable. Musculoskeletal: Degenerative changes in the spine. No worrisome lytic or sclerotic lesions. CT ABDOMEN PELVIS FINDINGS Hepatobiliary: Liver and gallbladder are unremarkable. No biliary ductal dilatation. Pancreas: Negative. Spleen: Negative. Adrenals/Urinary Tract: Adrenal glands are unremarkable. Small low-attenuation lesions in the kidneys, too small to characterize. No specific follow-up necessary. Small left renal stone. Ureters are decompressed. Bladder is grossly unremarkable. Stomach/Bowel: Stomach and small bowel unremarkable. Appendix is not readily visualized. Irregular apple-core mass in the cecum and ascending colon measures approximately 8.2 cm in length (coronal  image 44). Several small ileocolic mesenteric lymph nodes measure up to 8 mm. Colon is otherwise unremarkable. Vascular/Lymphatic: Atherosclerotic calcification of the aorta. No additional pathologically enlarged lymph nodes. Reproductive: Prostate is visualized. Other: No free fluid. Mesenteries and peritoneum are otherwise unremarkable.  Musculoskeletal: Well-circumscribed lucent lesions in the iliac wings are unchanged from 01/19/2010 and considered benign. No worrisome lytic or sclerotic lesions. IMPRESSION: 1. Cecal/ascending colon mass with small metastatic ileocolic mesenteric lymph nodes, compatible with primary colon carcinoma. No evidence of distant metastatic disease. 2. Left upper and left lower lobe pneumonia. Small to borderline enlarged mediastinal lymph nodes are likely reactive in etiology. 3. Small left renal stone. 4. Aortic atherosclerosis (ICD10-I70.0). Coronary artery calcification. 5. Emphysema (ICD10-J43.9). Electronically Signed   By: Newell Eke M.D.   On: 11/19/2023 14:16    Assessment and Plan  78 y.o. M with HTN, HLD, dCHF, CAD s/p CABG 2011 presented with symptomatic anemia, found to have colon mass.      Underwent EGD/colonoscopy 8/17 that showed large colon mass.     Colon mass/severe IDA --Patient admitted with anemia, found to have large colon mass.  -- Underwent EGD and colonoscopy that confirmed same, biopsies sent.  --On CT today, general surgery suspect that he has lymph node metastases, and so are recommending colectomy during this hospitalization - Consult general surgery Dr Arlester to go to OR on 8/20 - Dr. Brahmanday/oncology aware of patient --colon biopsy invasive tubular adenoma with high grade dysplasia.   Chronic blood loss anemia Patient admitted and underwent endoscopy and colonoscopy by Dr. Onita GI.  This showed large colon mass, likely cause of iron  deficiency anemia - Start ferrous sulfate    Acute combined systolic and diastolic congestive heart failure (HCC) Found to have elevated BNP, right-sided infiltrate and effusion, shortness of breath.   --Echocardiogram shows EF 45-50%.  Treated with IV Lasix , now appears euvolemic --sats stable at RA --[facility] cardiology signed off --cont present meds   Coronary artery disease involving autologous vein coronary bypass graft  with chronic angina pectoris Hyperlipidemia Hypertension  Blood pressure normal - Continue amlodipine , Imdur , metoprolol  - Hold aspirin  given anemia - Continue Lipitor  --pt remains in NSR          Procedures: Family communication :sister Consults : G.I., cardiology, general surgery CODE STATUS: full DVT Prophylaxis : SCD Level of care: Telemetry Cardiac Status is: Inpatient Remains inpatient appropriate because: requires colon mass surgery will transfer patient to MedSurg floor    TOTAL TIME TAKING CARE OF THIS PATIENT: 35 minutes.  >50% time spent on counselling and coordination of care  Note: This dictation was prepared with Dragon dictation along with smaller phrase technology. Any transcriptional errors that result from this process are unintentional.  Leita Blanch M.D    [facility]   CC: Primary care physician; Kotturi, Vinay K, MD

## 2023-11-20 NOTE — Consult Note (Addendum)
 WOC Nurse requested for preoperative stoma site marking  Discussed surgical procedure and stoma creation with patient.  Explained role of the WOC nurse team.  Provided the patient with educational booklet and provided samples of pouching options.  Answered patient's questions.   Examined patient lying and sitting upright, in order to place the marking in the patient's visual field, away from any creases or abdominal contour issues and within the rectus muscle.  Attempted to mark below the patient's belt line, but this was not possible, since the patient wears his pants low on his waist and a significant crease occurs lower on the abd when he leans forward which should be avoided if possible.   Marked for colostomy in the LLQ  __6__ cm to the left of the umbilicus and _3___cm above the umbilicus.  Marked for ileostomy in the RLQ  __6__cm to the right of the umbilicus and  __3__ cm above the umbilicus.  Patient's abdomen cleansed with CHG wipes at site markings, allowed to air dry prior to marking. Covered mark with thin film transparent dressing to preserve mark until date of surgery.   WOC Nurse team will follow up with patient after surgery for continued ostomy care and teaching if he receives an ostomy.   Thank-you,  Stephane Fought MSN, RN, CWOCN, CWCN-AP, CNS Contact Mon-Fri 0700-1500: [phone]

## 2023-11-20 NOTE — Progress Notes (Signed)
 Patient moved to room 211 by wheelchair. Alert with no distress noted. Olu, RN aware that patient is in new room.

## 2023-11-20 NOTE — Evaluation (Signed)
 Physical Therapy Evaluation Patient Details Name: Matthew Humphrey MRN: [id number] DOB: 1945-04-22 Today's Date: 11/20/2023  History of Present Illness  Pt is a 78 y.o. male with medical history significant of coronary artery disease status post CABG 2012, HTN, HLD, GERD, CKD who reports several month history of increasing shortness of breath, chest pain, dark stools. MD assessment includes: Colon mass with lymph node metastases with colectomy pending during this hospitalization, chronic blood loss anemia, acute combined systolic and diastolic congestive heart failure, and coronary artery disease involving autologous vein coronary bypass graft with chronic angina pectoris.   Clinical Impression  Pt was pleasant and motivated to participate during the session and put forth good effort throughout. Pt independent with all functional tasks assessed.  Pt demonstrated good speed and control with bed mobility tasks and was steady with no overt LOB with all standing tasks including dynamic gait tasks per below without the use of an AD. Pt reported feeling that he is currently at his functional baseline with no skilled PT needs at this time.  Pt reported no adverse symptoms during the session with SpO2 and HR WNL throughout on room air.  Will complete PT orders at this time but will reassess pt pending a change in status upon receipt of new PT orders.             If plan is discharge home, recommend the following: Assist for transportation   Can travel by private vehicle        Equipment Recommendations None recommended by PT  Recommendations for Other Services       Functional Status Assessment Patient has not had a recent decline in their functional status     Precautions / Restrictions Precautions Precautions: None Restrictions Weight Bearing Restrictions Per Provider Order: No      Mobility  Bed Mobility Overal bed mobility: Independent             General bed mobility  comments: Good speed and control with bed mobility tasks    Transfers Overall transfer level: Independent                 General transfer comment: Good eccentric and concentric control and stability without the need of UE assist    Ambulation/Gait Ambulation/Gait assistance: Independent Gait Distance (Feet): 200 Feet Assistive device: None Gait Pattern/deviations: WFL(Within Functional Limits) Gait velocity: WFL     General Gait Details: Pt steady during ambulation without an AD including during start/stops, rapid 180 deg turns, and with head turns left/right and up/down  Stairs            Wheelchair Mobility     Tilt Bed    Modified Rankin (Stroke Patients Only)       Balance Overall balance assessment: No apparent balance deficits (not formally assessed)                                           Pertinent Vitals/Pain Pain Assessment Pain Assessment: No/denies pain    Home Living Family/patient expects to be discharged to:: Private residence Living Arrangements: Alone Available Help at Discharge: Family;Available 24 hours/day; niece will stay with pt at discharge Type of Home: Mobile home Home Access: Stairs to enter Entrance Stairs-Rails: Right;Left;Can reach both Entrance Stairs-Number of Steps: 3   Home Layout: One level Home Equipment: Shower seat;Cane - single point;Grab bars - toilet  Prior Function Prior Level of Function : Independent/Modified Independent             Mobility Comments: Ind amb community distances without an AD, no fall history, occasional use of SPC due to back pain ADLs Comments: Ind with ADLs     Extremity/Trunk Assessment   Upper Extremity Assessment Upper Extremity Assessment: Overall WFL for tasks assessed    Lower Extremity Assessment Lower Extremity Assessment: Overall WFL for tasks assessed       Communication   Communication Communication: No apparent difficulties     Cognition Arousal: Alert Behavior During Therapy: WFL for tasks assessed/performed   PT - Cognitive impairments: No apparent impairments                         Following commands: Intact       Cueing       General Comments      Exercises     Assessment/Plan    PT Assessment Patient does not need any further PT services  PT Problem List         PT Treatment Interventions      PT Goals (Current goals can be found in the Care Plan section)  Acute Rehab PT Goals PT Goal Formulation: All assessment and education complete, DC therapy    Frequency       Co-evaluation               AM-PAC PT 6 Clicks Mobility  Outcome Measure Help needed turning from your back to your side while in a flat bed without using bedrails?: None Help needed moving from lying on your back to sitting on the side of a flat bed without using bedrails?: None Help needed moving to and from a bed to a chair (including a wheelchair)?: None Help needed standing up from a chair using your arms (e.g., wheelchair or bedside chair)?: None Help needed to walk in hospital room?: None Help needed climbing 3-5 steps with a railing? : None 6 Click Score: 24    End of Session Equipment Utilized During Treatment: Gait belt Activity Tolerance: Patient tolerated treatment well Patient left: in chair;with call bell/phone within reach Nurse Communication: Mobility status PT Visit Diagnosis: Muscle weakness (generalized) (M62.81)    Time: 9085-9068 PT Time Calculation (min) (ACUTE ONLY): 17 min   Charges:   PT Evaluation $PT Eval Low Complexity: 1 Low   PT General Charges $$ ACUTE PT VISIT: 1 Visit    D. Scott Dabid [place] PT, DPT 11/20/23, 10:19 AM

## 2023-11-20 NOTE — Progress Notes (Signed)
 [facility]- General Surgery  SURGICAL PROGRESS NOTE  Hospital Day(s): 4.   Interval History:  Patient seen and examined. States he was experiencing abdominal pain last night but has now resolved. Denies any other symptoms at this time.   Vital signs in last 24 hours: [min-max] current  Temp:  [98.2 F (36.8 C)-99.5 F (37.5 C)] 98.7 F (37.1 C) (08/19 0758) Pulse Rate:  [64-88] 88 (08/19 0758) Resp:  [18-20] 20 (08/19 0758) BP: (118-137)/(56-87) 137/87 (08/19 0758) SpO2:  [95 %-98 %] 95 % (08/19 0758)     Height: 5' 6 (167.6 cm) Weight: 81.6 kg BMI (Calculated): 29.05   Intake/Output last 2 shifts:  08/18 0701 - 08/19 0700 In: 1489.7 [P.O.:360; I.V.:1129.7] Out: 200 [Urine:200]   Physical Exam:  Constitutional: alert, cooperative and no distress  Respiratory: breathing non-labored at rest  Cardiovascular: regular rate and sinus rhythm  Gastrointestinal: soft, non-tender, and non-distended  Labs:     Latest Ref Rng & Units 11/20/2023    3:56 AM 11/19/2023    4:09 AM 11/18/2023    8:20 PM  CBC  WBC 4.0 - 10.5 K/uL 9.7  11.7  11.3   Hemoglobin 13.0 - 17.0 g/dL 8.8  8.7  8.8   Hematocrit 39.0 - 52.0 % 30.9  30.1  29.7   Platelets 150 - 400 K/uL 289  293  309       Latest Ref Rng & Units 11/20/2023    3:56 AM 11/19/2023    4:09 AM 11/18/2023    6:02 AM  CMP  Glucose 70 - 99 mg/dL 897  890  89   BUN 8 - 23 mg/dL 16  13  13    Creatinine 0.61 - 1.24 mg/dL 8.74  8.73  8.71   Sodium 135 - 145 mmol/L 135  137  138   Potassium 3.5 - 5.1 mmol/L 4.0  3.8  3.9   Chloride 98 - 111 mmol/L 104  106  104   CO2 22 - 32 mmol/L 24  24  25    Calcium  8.9 - 10.3 mg/dL 9.1  9.0  9.0     Imaging studies: No new pertinent imaging studies  Pathology report:  SURGICAL PATHOLOGY [facility] [address], [address] [place], [place] [address] Telephone [phone] or [phone] Fax [phone]  REPORT OF SURGICAL PATHOLOGY  Accession #:  [id number] Patient Name: Matthew Humphrey, Matthew Humphrey Visit # : [id number]  MRN: [id number] Physician: Onita Standing DOB/Age 12/21/1945 (Age: 78) Gender: M Collected Date: 11/18/2023 Received Date: 11/19/2023  FINAL DIAGNOSIS       1. Duodenum, Biopsy, cbx :      REACTIVE DUODENAL MUCOSA SHOWING GASTRIC METAPLASIA COMPATIBLE WITH PEPTIC      DUODENITIS       2. Stomach, biopsy, cbx random :      REACTIVE GASTROPATHY WITH MINIMAL CHRONIC GASTRITIS      NEGATIVE FOR H. PYLORI, INTESTINAL METAPLASIA, DYSPLASIA AND CARCINOMA       3. Stomach, biopsy, cbx irregular z line :      REACTIVE SQUAMOUS MUCOSA      CHRONIC GASTRITIS WITH REACTIVE EPITHELIAL CHANGES/FOVEOLAR HYPERPLASIA      NEGATIVE FOR INTESTINAL METAPLASIA, DYSPLASIA AND CARCINOMA       4. Ascending Colon Biopsy, cbx mass :      TUBULAR ADENOMA WITH HIGH-GRADE DYSPLASIA      HIGHLY SUSPICIOUS FOR INVASIVE CARCINOMA       5. Descending Colon Polyp, cold snare :  TUBULAR ADENOMA      NEGATIVE FOR HIGH-GRADE DYSPLASIA AND CARCINOMA       Diagnosis Note : The ascending colon mass (specimen 4) is reviewed by Dr.Lu who      concurs with the interpretation.      Diagnosis conveyed to Drs. Onita armin Pierre by Dr. Reed via Epic messaging      on 11/20/2023 at 8:25 AM.   ELECTRONIC SIGNATURE : [place]les[place] Md, Prentice , Sports administrator, International aid/development worker  MICROSCOPIC DESCRIPTION  CASE COMMENTS STAINS USED IN DIAGNOSIS: H&E H&E H&E H&E H&E   CLINICAL HISTORY  SPECIMEN(S) OBTAINED 1. Duodenum, Biopsy, Cbx 2. Stomach, biopsy, Cbx Random 3. Stomach, biopsy, Cbx Irregular Z Line 4. Ascending Colon Biopsy, Cbx Mass 5. Descending Colon Polyp, Cold Snare  SPECIMEN COMMENTS: 1. rule out celiac 2. rule out h pylori 3. rule out Barrett's SPECIMEN CLINICAL INFORMATION: 1. Melena, anemia.Gastritis, healing gastric ulcer, skin tag, diverticulosis, colon mass  Gross Description 1. Received in formalin are tan, soft tissue  fragments that are submitted in toto.Number:  4,   Size:  0.4 cm, 1 block 2. Received in formalin are tan, soft tissue fragments that are submitted in toto.Number:  4,   Size:  0.5 cm,  1 block 3. Received in formalin are tan, soft tissue fragments that are submitted in toto.Number:  three,  Size:  0.3 cm, 1 block 4. Received in formalin are tan, soft tissue fragments that are submitted in toto.Number:  4,   Size:  0.5 cm,  1 block. 5. Received in formalin is a tan, soft tissue fragment that is submitted in toto.Size:  0.5 cm, 1 block submitted.mb 11-19-23   Report signed out from the following location(s) [facility] [address] [place], [place] [address] CLIA #: [id number]  [facility] [address] [place], [place] [address] CLIA #: [id number]    Assessment/Plan:  78 y.o. male with tubular adenoma with high-grade dysplasia of ascending colon, highly suspicious for invasive carcinoma, complicated by pertinent comorbidities including hypertension, hyperlipidemia, GERD, CKD, and CAD s/p CABG in 2012.    -  Pathology report from biopsy taken during colonoscopy shown above confirms tubular adenoma with high-grade dysplasia, highly suspicious for invasive carcinoma  - CT results from yesterday concerning for metastatic ileocolic lymph nodes  - Planning for robotic assisted laparoscopic right colectomy with primary anastomosis tomorrow. Currently on clear liquid diet. Will be NPO past midnight   - Will start prep today    - Leukocytosis resolved 11.7 >> 9.7   - Hbg stable at 8.8, continue to monitor   - Continue pain management as needed   -- [employer] PA-C

## 2023-11-21 ENCOUNTER — Inpatient Hospital Stay: Payer: Self-pay

## 2023-11-21 ENCOUNTER — Encounter
Admission: EM | Disposition: A | Discharge status: Home / Self Care | Payer: Self-pay | Source: Ambulatory Visit | Attending: Internal Medicine

## 2023-11-21 ENCOUNTER — Other Ambulatory Visit: Payer: Self-pay

## 2023-11-21 DIAGNOSIS — D62 Acute posthemorrhagic anemia: Secondary | ICD-10-CM | POA: Diagnosis not present

## 2023-11-21 LAB — BASIC METABOLIC PANEL WITH GFR
Anion gap: 5 (ref 5–15)
BUN: 13 mg/dL (ref 8–23)
CO2: 24 mmol/L (ref 22–32)
Calcium: 9.2 mg/dL (ref 8.9–10.3)
Chloride: 109 mmol/L (ref 98–111)
Creatinine, Ser: 1.14 mg/dL (ref 0.61–1.24)
GFR, Estimated: >60 mL/min (ref >60)
Glucose, Bld: 98 mg/dL (ref 70–99)
Potassium: 4.1 mmol/L (ref 3.5–5.1)
Sodium: 138 mmol/L (ref 135–145)

## 2023-11-21 LAB — TYPE AND SCREEN
ABO/RH(D): O POS
Antibody Screen: NEGATIVE

## 2023-11-21 LAB — CBC
HCT: 30.6 % — ABNORMAL LOW (ref 39.0–52.0)
Hemoglobin: 8.7 g/dL — ABNORMAL LOW (ref 13.0–17.0)
MCH: 20.1 pg — ABNORMAL LOW (ref 26.0–34.0)
MCHC: 28.4 g/dL — ABNORMAL LOW (ref 30.0–36.0)
MCV: 70.7 fL — ABNORMAL LOW (ref 80.0–100.0)
Platelets: 325 K/uL (ref 150–400)
RBC: 4.33 MIL/uL (ref 4.22–5.81)
RDW: 23 % — ABNORMAL HIGH (ref 11.5–15.5)
WBC: 7.4 K/uL (ref 4.0–10.5)
nRBC: 0 % (ref 0.0–0.2)

## 2023-11-21 SURGERY — COLECTOMY, PARTIAL, ROBOT-ASSISTED, LAPAROSCOPIC
Anesthesia: General | Laterality: Right

## 2023-11-21 MED ORDER — OXYCODONE HCL 5 MG PO TABS: 5.0000 mg | ORAL_TABLET | Freq: Once | ORAL | Status: DC | PRN

## 2023-11-21 MED ORDER — INDOCYANINE GREEN 25 MG IV SOLR
INTRAVENOUS | Status: DC | PRN
  Administered 2023-11-21: 5 mg via INTRAVENOUS

## 2023-11-21 MED ORDER — FENTANYL CITRATE (PF) 100 MCG/2ML IJ SOLN
INTRAMUSCULAR | Status: AC
  Filled 2023-11-21: qty 2

## 2023-11-21 MED ORDER — ENOXAPARIN SODIUM 40 MG/0.4ML IJ SOSY
40.0000 mg | PREFILLED_SYRINGE | INTRAMUSCULAR | Status: DC
  Administered 2023-11-22 – 2023-11-25 (×4): 40 mg via SUBCUTANEOUS
  Filled 2023-11-21 (×4): qty 0.4

## 2023-11-21 MED ORDER — HYDROMORPHONE HCL 1 MG/ML IJ SOLN
INTRAMUSCULAR | Status: AC
  Filled 2023-11-21: qty 1

## 2023-11-21 MED ORDER — BUPIVACAINE-EPINEPHRINE 0.5% -1:200000 IJ SOLN
INTRAMUSCULAR | Status: DC | PRN
  Administered 2023-11-21: 30 mL via INTRAMUSCULAR

## 2023-11-21 MED ORDER — ONDANSETRON HCL 4 MG/2ML IJ SOLN
INTRAMUSCULAR | Status: DC | PRN
  Administered 2023-11-21: 4 mg via INTRAVENOUS

## 2023-11-21 MED ORDER — FENTANYL CITRATE (PF) 100 MCG/2ML IJ SOLN
INTRAMUSCULAR | Status: AC
Start: 2023-11-21 — End: 2023-11-21
  Filled 2023-11-21: qty 2

## 2023-11-21 MED ORDER — ACETAMINOPHEN 10 MG/ML IV SOLN
INTRAVENOUS | Status: AC
  Filled 2023-11-21: qty 100

## 2023-11-21 MED ORDER — PHENYLEPHRINE HCL-NACL 20-0.9 MG/250ML-% IV SOLN
INTRAVENOUS | Status: DC | PRN
  Administered 2023-11-21: 40 ug/min via INTRAVENOUS

## 2023-11-21 MED ORDER — KETAMINE HCL 50 MG/5ML IJ SOSY
PREFILLED_SYRINGE | INTRAMUSCULAR | Status: DC | PRN
  Administered 2023-11-21: 10 mg via INTRAVENOUS
  Administered 2023-11-21: 20 mg via INTRAVENOUS

## 2023-11-21 MED ORDER — CEFOTETAN DISODIUM 2 G IJ SOLR
INTRAMUSCULAR | Status: AC
  Filled 2023-11-21: qty 2

## 2023-11-21 MED ORDER — PHENYLEPHRINE HCL-NACL 20-0.9 MG/250ML-% IV SOLN
INTRAVENOUS | Status: AC
  Filled 2023-11-21: qty 250

## 2023-11-21 MED ORDER — PROPOFOL 10 MG/ML IV BOLUS
INTRAVENOUS | Status: DC | PRN
  Administered 2023-11-21: 150 mg via INTRAVENOUS

## 2023-11-21 MED ORDER — PROPOFOL 10 MG/ML IV BOLUS
INTRAVENOUS | Status: AC
  Filled 2023-11-21: qty 20

## 2023-11-21 MED ORDER — ACETAMINOPHEN 10 MG/ML IV SOLN
INTRAVENOUS | Status: DC | PRN
  Administered 2023-11-21: 1000 mg via INTRAVENOUS

## 2023-11-21 MED ORDER — HYDROMORPHONE HCL 1 MG/ML IJ SOLN
INTRAMUSCULAR | Status: DC | PRN
  Administered 2023-11-21: .25 mg via INTRAVENOUS
  Administered 2023-11-21: .5 mg via INTRAVENOUS
  Administered 2023-11-21: .25 mg via INTRAVENOUS

## 2023-11-21 MED ORDER — BUPIVACAINE-EPINEPHRINE (PF) 0.5% -1:200000 IJ SOLN
INTRAMUSCULAR | Status: AC
  Filled 2023-11-21: qty 30

## 2023-11-21 MED ORDER — HYDROMORPHONE HCL 1 MG/ML IJ SOLN
0.5000 mg | INTRAMUSCULAR | Status: DC | PRN
  Administered 2023-11-21 – 2023-11-25 (×15): 1 mg via INTRAVENOUS
  Filled 2023-11-21 (×16): qty 1

## 2023-11-21 MED ORDER — SODIUM CHLORIDE 0.9 % IV SOLN
1.0000 g | Freq: Once | INTRAVENOUS | Status: DC
  Filled 2023-11-21: qty 1

## 2023-11-21 MED ORDER — FENTANYL CITRATE (PF) 100 MCG/2ML IJ SOLN
25.0000 ug | INTRAMUSCULAR | Status: DC | PRN
  Administered 2023-11-21 (×5): 25 ug via INTRAVENOUS

## 2023-11-21 MED ORDER — LACTATED RINGERS IV SOLN: INTRAVENOUS | Status: AC

## 2023-11-21 MED ORDER — PHENYLEPHRINE 80 MCG/ML (10ML) SYRINGE FOR IV PUSH (FOR BLOOD PRESSURE SUPPORT)
PREFILLED_SYRINGE | INTRAVENOUS | Status: DC | PRN
  Administered 2023-11-21 (×3): 80 ug via INTRAVENOUS

## 2023-11-21 MED ORDER — PHENYLEPHRINE 80 MCG/ML (10ML) SYRINGE FOR IV PUSH (FOR BLOOD PRESSURE SUPPORT)
PREFILLED_SYRINGE | INTRAVENOUS | Status: AC
  Filled 2023-11-21: qty 10

## 2023-11-21 MED ORDER — SUGAMMADEX SODIUM 200 MG/2ML IV SOLN
INTRAVENOUS | Status: DC | PRN
  Administered 2023-11-21: 200 mg via INTRAVENOUS

## 2023-11-21 MED ORDER — ROCURONIUM BROMIDE 100 MG/10ML IV SOLN
INTRAVENOUS | Status: DC | PRN
  Administered 2023-11-21 (×3): 20 mg via INTRAVENOUS
  Administered 2023-11-21: 60 mg via INTRAVENOUS

## 2023-11-21 MED ORDER — GLYCOPYRROLATE 0.2 MG/ML IJ SOLN
INTRAMUSCULAR | Status: AC
  Filled 2023-11-21: qty 2

## 2023-11-21 MED ORDER — KETAMINE HCL 50 MG/5ML IJ SOSY
PREFILLED_SYRINGE | INTRAMUSCULAR | Status: AC
  Filled 2023-11-21: qty 5

## 2023-11-21 MED ORDER — LIDOCAINE HCL (CARDIAC) PF 100 MG/5ML IV SOSY
PREFILLED_SYRINGE | INTRAVENOUS | Status: DC | PRN
  Administered 2023-11-21: 80 mg via INTRAVENOUS

## 2023-11-21 MED ORDER — LACTATED RINGERS IV SOLN: INTRAVENOUS | Status: DC | PRN

## 2023-11-21 MED ORDER — OXYCODONE HCL 5 MG/5ML PO SOLN: 5.0000 mg | Freq: Once | ORAL | Status: DC | PRN

## 2023-11-21 MED ORDER — DEXAMETHASONE SODIUM PHOSPHATE 10 MG/ML IJ SOLN
INTRAMUSCULAR | Status: DC | PRN
  Administered 2023-11-21: 10 mg via INTRAVENOUS

## 2023-11-21 MED ORDER — 0.9 % SODIUM CHLORIDE (POUR BTL) OPTIME
TOPICAL | Status: DC | PRN
  Administered 2023-11-21: 1000 mL

## 2023-11-21 MED ORDER — OXYCODONE HCL 5 MG PO TABS
ORAL_TABLET | ORAL | Status: AC
  Filled 2023-11-21: qty 1

## 2023-11-21 MED ORDER — FENTANYL CITRATE (PF) 100 MCG/2ML IJ SOLN
INTRAMUSCULAR | Status: DC | PRN
  Administered 2023-11-21: 100 ug via INTRAVENOUS

## 2023-11-21 MED ORDER — GLYCOPYRROLATE 0.2 MG/ML IJ SOLN
INTRAMUSCULAR | Status: DC | PRN
  Administered 2023-11-21: .1 mg via INTRAVENOUS

## 2023-11-21 MED ORDER — SODIUM CHLORIDE 0.9 % IV SOLN: INTRAVENOUS | Status: DC | PRN

## 2023-11-21 SURGICAL SUPPLY — 59 items
BASIN KIT SINGLE STR (MISCELLANEOUS) ×1 IMPLANT
BLADE CLIPPER SURG (BLADE) ×1 IMPLANT
CANNULA CAP OBTURATR AIRSEAL 8 (CAP) ×1 IMPLANT
COVER TIP SHEARS 8 DVNC (MISCELLANEOUS) ×1 IMPLANT
DEFOGGER SCOPE WARM SEASHARP (MISCELLANEOUS) ×1 IMPLANT
DERMABOND ADVANCED .7 DNX12 (GAUZE/BANDAGES/DRESSINGS) IMPLANT
DRAPE ARM DVNC X/XI (DISPOSABLE) ×4 IMPLANT
DRAPE COLUMN DVNC XI (DISPOSABLE) ×1 IMPLANT
DRSG OPSITE POSTOP 4X10 (GAUZE/BANDAGES/DRESSINGS) IMPLANT
DRSG OPSITE POSTOP 4X8 (GAUZE/BANDAGES/DRESSINGS) IMPLANT
ELECTRODE REM PT RTRN 9FT ADLT (ELECTROSURGICAL) ×1 IMPLANT
FORCEPS BPLR FENES DVNC XI (FORCEP) ×1 IMPLANT
GLOVE BIOGEL PI IND STRL 7.0 (GLOVE) ×3 IMPLANT
GLOVE SURG SYN 6.5 PF PI (GLOVE) ×5 IMPLANT
GOWN STRL REUS W/ TWL LRG LVL3 (GOWN DISPOSABLE) ×7 IMPLANT
GRASPER SUT TROCAR 14GX15 (MISCELLANEOUS) IMPLANT
GRASPER TIP-UP FEN DVNC XI (INSTRUMENTS) ×1 IMPLANT
HANDLE YANKAUER SUCT BULB TIP (MISCELLANEOUS) ×1 IMPLANT
IRRIGATION STRYKERFLOW (MISCELLANEOUS) IMPLANT
IRRIGATOR SUCT 8 DISP DVNC XI (IRRIGATION / IRRIGATOR) IMPLANT
IV NS 1000ML BAXH (IV SOLUTION) IMPLANT
KIT IMAGING PINPOINTPAQ (MISCELLANEOUS) IMPLANT
KIT PINK PAD W/HEAD ARM REST (MISCELLANEOUS) ×1 IMPLANT
LABEL OR SOLS (LABEL) IMPLANT
MANIFOLD NEPTUNE II (INSTRUMENTS) ×1 IMPLANT
NDL DRIVE SUT CUT DVNC (INSTRUMENTS) ×1 IMPLANT
NDL HYPO 22X1.5 SAFETY MO (MISCELLANEOUS) ×1 IMPLANT
NDL INSUFFLATION 14GA 120MM (NEEDLE) ×1 IMPLANT
NEEDLE DRIVE SUT CUT DVNC (INSTRUMENTS) ×1 IMPLANT
NEEDLE HYPO 22X1.5 SAFETY MO (MISCELLANEOUS) ×1 IMPLANT
NEEDLE INSUFFLATION 14GA 120MM (NEEDLE) ×1 IMPLANT
OBTURATOR OPTICALSTD 8 DVNC (TROCAR) ×1 IMPLANT
PACK COLON CLEAN CLOSURE (MISCELLANEOUS) ×1 IMPLANT
PACK LAP CHOLECYSTECTOMY (MISCELLANEOUS) ×1 IMPLANT
RELOAD STAPLE 60 3.5 BLU DVNC (STAPLE) IMPLANT
RETRACTOR WOUND ALXS 18CM MED (MISCELLANEOUS) IMPLANT
RETRACTOR WOUND ALXS 18CM SML (MISCELLANEOUS) IMPLANT
SCISSORS MNPLR CVD DVNC XI (INSTRUMENTS) ×1 IMPLANT
SEAL UNIV 5-12 XI (MISCELLANEOUS) ×3 IMPLANT
SEALER VESSEL EXT DVNC XI (MISCELLANEOUS) IMPLANT
SET TUBE FILTERED XL AIRSEAL (SET/KITS/TRAYS/PACK) ×1 IMPLANT
SLEEVE Z-THREAD 5X100MM (TROCAR) ×1 IMPLANT
SOLUTION ELECTROSURG ANTI STCK (MISCELLANEOUS) ×1 IMPLANT
SPONGE T-LAP 18X18 ~~LOC~~+RFID (SPONGE) ×1 IMPLANT
STAPLER 60 SUREFORM DVNC (STAPLE) IMPLANT
STAPLER SKIN PROX 35W (STAPLE) IMPLANT
SUT PDS AB 1 CT1 36 (SUTURE) ×2 IMPLANT
SUT SILK 3 0 SH 30 (SUTURE) IMPLANT
SUT STRATA 3-0 15 PS-2 (SUTURE) IMPLANT
SUT STRATA 3-0 23 RB-1.5 (SUTURE) IMPLANT
SUT VIC AB 3-0 SH 27X BRD (SUTURE) IMPLANT
SUT VICRYL 0 UR6 27IN ABS (SUTURE) ×1 IMPLANT
SUTURE MNCRL 4-0 27XMF (SUTURE) ×1 IMPLANT
SYR 30ML LL (SYRINGE) ×2 IMPLANT
SYSTEM LAPSCP GELPORT 120MM (MISCELLANEOUS) IMPLANT
SYSTEM WECK SHIELD CLOSURE (TROCAR) IMPLANT
TRAP FLUID SMOKE EVACUATOR (MISCELLANEOUS) ×1 IMPLANT
TRAY FOLEY MTR SLVR 16FR STAT (SET/KITS/TRAYS/PACK) ×1 IMPLANT
WATER STERILE IRR 500ML POUR (IV SOLUTION) ×1 IMPLANT

## 2023-11-21 NOTE — Progress Notes (Signed)
  Progress Note   Patient: Matthew Humphrey FMW:[id number] DOB: 1945/07/09 DOA: 11/16/2023     5 DOS: the patient was seen and examined on 11/21/2023   Brief hospital course: 78 y.o. M with HTN, HLD, dCHF, CAD s/p CABG 2011 presented with symptomatic anemia, found to have colon mass.     Underwent EGD/colonoscopy 8/17 that showed large colon mass.  Biopsies sent.   I assumed care on 11/21/23.  8/20 -- underwent robotic assisted laparoscopic right colectomy with Dr. Tye   Assessment and Plan:  Colon mass/severe IDA Patient admitted with anemia, found to have large colon mass.  Underwent EGD and colonoscopy that confirmed same, biopsies sent. On CT, general surgery suspect that he has lymph node metastases, and so are recommending colectomy during this hospitalization - Consult general surgery Dr Tye -- To OR today for hemicolectomy - Dr. Brahmanday/oncology aware of patient --colon biopsy invasive tubular adenoma with high grade dysplasia.   Chronic blood loss anemia Patient admitted and underwent endoscopy and colonoscopy by Dr. Onita GI.  This showed large colon mass, likely cause of iron  deficiency anemia - Continue ferrous sulfate    Acute combined systolic and diastolic congestive heart failure (HCC) Found to have elevated BNP, right-sided infiltrate and effusion, shortness of breath.   --Echocardiogram shows EF 45-50%.  Treated with IV Lasix , now appears euvolemic --sats stable at RA --[facility] cardiology signed off --cont present meds   Coronary artery disease involving autologous vein coronary bypass graft with chronic angina pectoris Hyperlipidemia Hypertension  Blood pressure normal - Continue amlodipine , Imdur , metoprolol  - Hold aspirin  given anemia - Continue Lipitor  --pt remains in NSR      Subjective: Pt awaiting surgery this AM.  No acute complaints or events reported.   Physical Exam: Vitals:   11/21/23 1505 11/21/23 1510 11/21/23 1515 11/21/23 1520   BP:   (!) 152/70   Pulse: 99 92 92 91  Resp: (!) 22 (!) 23 (!) 24 (!) 22  Temp:   97.8 F (36.6 C)   TempSrc:      SpO2: 94% 100% 97% 97%  Weight:      Height:       General exam: awake, alert, no acute distress  Respiratory system: CTAB, no wheezes, rales or rhonchi, normal respiratory effort. Cardiovascular system: normal S1/S2, RRR Gastrointestinal system: soft, NT, ND, +BS Central nervous system: no gross focal neurologic deficits, normal speech Extremities: moves all , no edema, normal tone Skin: dry, intact, no rashes seen on visualized skin   Data Reviewed:  Notable labs -- BMP normal.  Hbg 8.7 stable  Family Communication: None present  Disposition: Status is: Inpatient Remains inpatient appropriate because: Surgery today   Planned Discharge Destination: Home with Home Health    Time spent: 40 minutes  Author: Burnard DELENA Cunning, DO 11/21/2023 3:26 PM  For on call review [email or web address].

## 2023-11-21 NOTE — Anesthesia Procedure Notes (Signed)
 Procedure Name: Intubation Date/Time: 11/21/2023 10:07 AM  Performed by: Blair Suzen BRAVO, RNPre-anesthesia Checklist: Patient identified, Patient being monitored, Timeout performed, Emergency Drugs available and Suction available Patient Re-evaluated:Patient Re-evaluated prior to induction Oxygen Delivery Method: Circle system utilized Preoxygenation: Pre-oxygenation with 100% oxygen Induction Type: IV induction Ventilation: Oral airway inserted - appropriate to patient size Laryngoscope Size: McGrath and 4 Grade View: Grade I Tube type: Oral Tube size: 7.5 mm Number of attempts: 1 Airway Equipment and Method: Stylet Placement Confirmation: ETT inserted through vocal cords under direct vision, positive ETCO2 and breath sounds checked- equal and bilateral Secured at: 23 cm Tube secured with: Tape Dental Injury: Teeth and Oropharynx as per pre-operative assessment

## 2023-11-21 NOTE — Anesthesia Preprocedure Evaluation (Addendum)
 Anesthesia Evaluation  Patient identified by MRN, date of birth, ID band Patient awake    Reviewed: Allergy & Precautions, NPO status , Patient's Chart, lab work & pertinent test results  History of Anesthesia Complications Negative for: history of anesthetic complications  Airway Mallampati: III  TM Distance: >3 FB Neck ROM: full    Dental no notable dental hx.    Pulmonary former smoker   Pulmonary exam normal        Cardiovascular hypertension, On Medications + CAD, + CABG and +CHF  Normal cardiovascular exam  IMPRESSIONS     1. Inferior posterior septal hypo.   2. Left ventricular ejection fraction, by estimation, is 45 to 50%. The  left ventricle has mildly decreased function. The left ventricle  demonstrates regional wall motion abnormalities (see scoring  diagram/findings for description). Left ventricular  diastolic parameters are consistent with Grade I diastolic dysfunction  (impaired relaxation).   3. Right ventricular systolic function is normal. The right ventricular  size is normal.   4. The mitral valve is normal in structure. Mild mitral valve  regurgitation.   5. The aortic valve is normal in structure. Aortic valve regurgitation is  not visualized.     Neuro/Psych  Neuromuscular disease  negative psych ROS   GI/Hepatic Neg liver ROS,GERD  ,,  Endo/Other  negative endocrine ROS    Renal/GU CRFRenal disease     Musculoskeletal   Abdominal   Peds  Hematology  (+) Blood dyscrasia, anemia   Anesthesia Other Findings Past Medical History: No date: Arthritis     Comment:  knee No date: Chronic kidney disease No date: GERD (gastroesophageal reflux disease) No date: Hyperlipidemia No date: Hypertension  Past Surgical History: 03/2010: CARDIAC SURGERY     Comment:  bypass 12/11/2012: CERVICAL FUSION     Comment:  C4-5,C5-6,C6-7 2011 ?: COLONOSCOPY 11/18/2023: COLONOSCOPY     Comment:   Procedure: COLONOSCOPY;  Surgeon: Onita Elspeth Sharper,              DO;  Location: [facility] ENDOSCOPY;  Service:               Gastroenterology;; 07/11/2017: COLONOSCOPY WITH PROPOFOL ; N/A     Comment:  Procedure: COLONOSCOPY WITH PROPOFOL ;  Surgeon: Unk Corinn Skiff, MD;  Location: [facility];                Service: Endoscopy;  Laterality: N/A; 11/18/2023: ESOPHAGOGASTRODUODENOSCOPY; N/A     Comment:  Procedure: EGD (ESOPHAGOGASTRODUODENOSCOPY);  Surgeon:               Onita Elspeth Sharper, DO;  Location: [facility] ENDOSCOPY;                Service: Gastroenterology;  Laterality: N/A; 07/11/2017: POLYPECTOMY; N/A     Comment:  Procedure: POLYPECTOMY;  Surgeon: Unk Corinn Skiff,               MD;  Location: [facility];  Service: Endoscopy;               Laterality: N/A;  BMI    Body Mass Index: 29.04 kg/m      Reproductive/Obstetrics negative OB ROS                              Anesthesia Physical Anesthesia Plan  ASA: 3  Anesthesia Plan: General ETT  Post-op Pain Management: Ofirmev  IV (intra-op)*, Dilaudid  IV and Ketamine  IV*   Induction: Intravenous  PONV Risk Score and Plan: 2 and Ondansetron , Dexamethasone  and Treatment may vary due to age or medical condition  Airway Management Planned: Oral ETT  Additional Equipment:   Intra-op Plan:   Post-operative Plan: Extubation in OR  Informed Consent: I have reviewed the patients History and Physical, chart, labs and discussed the procedure including the risks, benefits and alternatives for the proposed anesthesia with the patient or authorized representative who has indicated his/her understanding and acceptance.     Dental Advisory Given  Plan Discussed with: Anesthesiologist, CRNA and Surgeon  Anesthesia Plan Comments: (Patient consented for risks of anesthesia including but not limited to:  - adverse reactions to medications - damage to eyes, teeth, lips  or other oral mucosa - nerve damage due to positioning  - sore throat or hoarseness - Damage to heart, brain, nerves, lungs, other parts of body or loss of life  Patient voiced understanding and assent.)         Anesthesia Quick Evaluation

## 2023-11-21 NOTE — Interval H&P Note (Signed)
 History and Physical Interval Note:  11/21/2023 8:25 AM  Matthew Humphrey  has presented today for surgery, with the diagnosis of colon cancer.  The various methods of treatment have been discussed with the patient and family. After consideration of risks, benefits and other options for treatment, the patient has consented to  Procedure(s) with comments: COLECTOMY, PARTIAL, ROBOT-ASSISTED, LAPAROSCOPIC (Right) - right hemi collectomy as a surgical intervention.  The patient's history has been reviewed, patient examined, no change in status, stable for surgery.  I have reviewed the patient's chart and labs.  Questions were answered to the patient's satisfaction.    The risk of surgery include, but not limited to, recurrence, bleeding, chronic pain, post-op infxn, post-op SBO or ileus, hernias, resection of bowel, re-anastamosis, possible ostomy placement and need for re-operation to address said risks. The risks of general anesthetic, if used, includes MI, CVA, sudden death or even reaction to anesthetic medications also discussed. Alternatives include continued observation and NG decompression.  Benefits include possible symptom relief, preventing further decline in health and possible death.  Typical post-op recovery time of additional days in hospital for observation afterwards also discussed.  The patient verbalized understanding and all questions were answered to the patient's satisfaction.   Marianela Mandrell Tye

## 2023-11-21 NOTE — Transfer of Care (Signed)
 Immediate Anesthesia Transfer of Care Note  Patient: Matthew Humphrey  Procedure(s) Performed: COLECTOMY, PARTIAL, ROBOT-ASSISTED, LAPAROSCOPIC (Right)  Patient Location: PACU  Anesthesia Type:General  Level of Consciousness: awake, alert , and patient cooperative  Airway & Oxygen Therapy: Patient Spontanous Breathing and Patient connected to face mask oxygen  Post-op Assessment: Report given to RN and Post -op Vital signs reviewed and stable  Post vital signs: Reviewed and stable  Last Vitals:  Vitals Value Taken Time  BP 162/81 11/21/23 14:38  Temp 35.8   Pulse 98 11/21/23 14:41  Resp 17 11/21/23 14:41  SpO2 100 % 11/21/23 14:41  Vitals shown include unfiled device data.  Last Pain:  Vitals:   11/21/23 0930  TempSrc: Temporal  PainSc: 0-No pain      Patients Stated Pain Goal: 0 (11/18/23 2032)  Complications: No notable events documented.

## 2023-11-21 NOTE — Op Note (Signed)
 Preoperative diagnosis: Right colon CA   Postoperative diagnosis: Same  Procedure: Robotic assisted laparoscopic right colectomy.   Anesthesia: GETA   Surgeon: Henriette Pierre   Wound Classification: clean contaminated   Specimen: Right colon   Complications: None   Estimated Blood Loss: 80 mL   Indications: Please see H&P for further details.     FIndings: 1.  RIGHT colon mass with tattoo 2.  likely ileocolic mesentary metastasis noted on CT, shows area of tissue adhesions and firmness, concerning for metastasis. Ink reaction vs. Tumor extension to omentum around ink and tumor site. 3. Adequate hemostasis.  4.  No liver or peritoneal metastasis noted  Operation performed with curative intent:Yes  Tumor Location:Ascending colon  Extent of colon and vascular resection:Right hemicolectomy - ileocolic, right colic present   Description of procedure: The patient was placed on the operating table in the supine position, both arms tucked. General anesthesia was induced. A time-out was completed verifying correct patient, procedure, site, positioning, and implant(s) and/or special equipment prior to beginning this procedure. The abdomen was prepped and draped in the usual sterile fashion.    Palmer's point located and Veress needle was inserted.  After confirming 2 clicks and a positive saline drop test, gas insufflation was initiated until the abdominal pressure was measured at 15 mmHg.  Afterwards, the Veress needle was removed and a 8 mm port was placed through the same site using Optiview technique after extending the incision with an 11 blade.  Marcaine  infused as a tap block.   3 additional incision was made 8 cm apart along the left side of the abdominal wall from the initial incision and two 8mm and one 12mm port placed under direct visualization.  No injuries from trocar placements were noted. The table was placed in the reverse Trendelenburg position with the right side elevated.   Xi robotic platform was then brought to the operative field and docked at an angle from the left lower quadrant.  Tip up grasper and hook cautery was placed in right arm ports.  Fenestrated bipolar in left arm port.   Examination of the abdominal cavity noted likely ileocolic mesentary metastasis noted on CT, shows area of tissue adhesions and firmness, concerning for metastasis. Ink reaction vs. Tumor extension to omentum around ink and tumor site. No liver or peritoneal metastasis noted.   Dissection was started by removing the lateral attachments of the right colon along the white line of Toldt, ensuring the right ureter was not involved.  This was carried around the hepatic flexure to the mid portion of the transverse colon.  Omentum was transected from the transverse colon to the same point.  Afterwards, the right colon was grasped and elevated to visualize mesentary. Point was chosen on the transverse colon for staple transection, proximal to the visualized middle colic vessels.   60mm blue load stapler was then used to transect the colon at this point.  Vessel sealer was then used to transect the right colon mesentery towards a previously determined point on the terminal ileum, care taken to ensure as much mesentery was taken for lymph node evaluation, and also visualizing the duodenum and placing it away from area of transection during this portion.  the mesenteric node metastasis is incorporated into the specimen. Once the terminal ileum was reached, 60mm blue load stapler was used to transect the terminal ileum.  ICG was then infused and the colon show noted to have adequate circulation but the terminal ileum lacked blood flow.  1 additional blue load stapler was used to transect the small bowel and the area of adequate blood flow in preparation for anastomosis.   The colon and terminal ileum specimen placed atop the liver.  The terminal ileum was then brought towards the transverse colon and a  side-to-side anastomosis was created with the small bowel laying on twisted.  Enterotomies were made in the small bowel and the transverse colon 6 cm from the staple lines.  60 mm blue load stapler placed through these enterotomies and new anastomosis created.  The enterotomy was then closed with running 3-0 STRATAFIX in a 2 layer fashion.    No bleeding or additional pathology was noted.   Robot was then undocked, and the remaining port sites were removed, the abdomen was allowed to collapse.  The 12 mm port site fascia extended, wound protector placed, and specimen was able to be removed out of the abdomen completely. Of note, small tear in the colon created during retraction of the specimen but this was outside the body cavity when it occurred.  No evidence of overt spillage within the abdominal cavity upon reexamination.  Clean closure protocol initiated and extraction site closed using 0 PDS x 2. Deep dermal then closed with 3-0 vicryl interrupted fashion.  All skin incisions then closed with subcuticular sutures Monocryl 4-0.  Wounds then dressed with dermabond.   The patient tolerated the procedure well, awakened from anesthesia and was taken to the postanesthesia care unit in satisfactory condition.  Foley still in place.  Sponge count and instrument count correct at the end of the procedure.

## 2023-11-22 ENCOUNTER — Other Ambulatory Visit: Payer: Self-pay

## 2023-11-22 DIAGNOSIS — D62 Acute posthemorrhagic anemia: Secondary | ICD-10-CM | POA: Diagnosis not present

## 2023-11-22 LAB — BASIC METABOLIC PANEL WITH GFR
Anion gap: 8 (ref 5–15)
BUN: 11 mg/dL (ref 8–23)
CO2: 23 mmol/L (ref 22–32)
Calcium: 8.9 mg/dL (ref 8.9–10.3)
Chloride: 106 mmol/L (ref 98–111)
Creatinine, Ser: 1.29 mg/dL — ABNORMAL HIGH (ref 0.61–1.24)
GFR, Estimated: 57 mL/min — ABNORMAL LOW (ref >60)
Glucose, Bld: 109 mg/dL — ABNORMAL HIGH (ref 70–99)
Potassium: 4.7 mmol/L (ref 3.5–5.1)
Sodium: 137 mmol/L (ref 135–145)

## 2023-11-22 LAB — CBC
HCT: 29.3 % — ABNORMAL LOW (ref 39.0–52.0)
Hemoglobin: 8.2 g/dL — ABNORMAL LOW (ref 13.0–17.0)
MCH: 20 pg — ABNORMAL LOW (ref 26.0–34.0)
MCHC: 28 g/dL — ABNORMAL LOW (ref 30.0–36.0)
MCV: 71.3 fL — ABNORMAL LOW (ref 80.0–100.0)
Platelets: 328 K/uL (ref 150–400)
RBC: 4.11 MIL/uL — ABNORMAL LOW (ref 4.22–5.81)
RDW: 23.2 % — ABNORMAL HIGH (ref 11.5–15.5)
WBC: 9 K/uL (ref 4.0–10.5)
nRBC: 0.3 % — ABNORMAL HIGH (ref 0.0–0.2)

## 2023-11-22 LAB — PHOSPHORUS: Phosphorus: 3.8 mg/dL (ref 2.5–4.6)

## 2023-11-22 LAB — MAGNESIUM: Magnesium: 2 mg/dL (ref 1.7–2.4)

## 2023-11-22 NOTE — Consult Note (Addendum)
 WOC Nurse follow up consult Note: Pt did not receive an ostomy yesterday in the OR. Surgical team is following for assessment and plan of care.  No further role for WOC team.  Please re-consult if further assistance is needed.  Thank-you,  Stephane Fought MSN, RN, CWOCN, CWCN-AP, CNS Contact Mon-Fri 0700-1500: [phone]

## 2023-11-22 NOTE — Anesthesia Postprocedure Evaluation (Signed)
 Anesthesia Post Note  Patient: Matthew Humphrey  Procedure(s) Performed: COLECTOMY, PARTIAL, ROBOT-ASSISTED, LAPAROSCOPIC (Right)  Patient location during evaluation: PACU Anesthesia Type: General Level of consciousness: awake and alert Pain management: pain level controlled Vital Signs Assessment: post-procedure vital signs reviewed and stable Respiratory status: spontaneous breathing, nonlabored ventilation, respiratory function stable and patient connected to nasal cannula oxygen Cardiovascular status: blood pressure returned to baseline and stable Postop Assessment: no apparent nausea or vomiting Anesthetic complications: no   No notable events documented.   Last Vitals:  Vitals:   11/21/23 2041 11/22/23 0448  BP: 138/72 123/64  Pulse: 82 76  Resp: 20 18  Temp: 36.7 C 36.7 C  SpO2: 94% 94%    Last Pain:  Vitals:   11/22/23 0617  TempSrc:   PainSc: 2                  [name]

## 2023-11-22 NOTE — Progress Notes (Signed)
 Initial Nutrition Assessment  DOCUMENTATION CODES:   Not applicable  INTERVENTION:   Initiate TPN per pharmacy- to start 8/22  Recommend thiamine  100mg  IV daily x 5-7 days   Pt at high refeed risk; recommend monitor potassium, magnesium and phosphorus labs daily until stable  Daily weights   NUTRITION DIAGNOSIS:   Inadequate oral intake related to altered GI function as evidenced by other (comment) (pt on clear liquid diet).  GOAL:   Patient will meet greater than or equal to 90% of their needs  MONITOR:   PO intake, Supplement acceptance, Diet advancement, Labs, Weight trends, Skin, I & O's  REASON FOR ASSESSMENT:   NPO/Clear Liquid Diet    ASSESSMENT:   78 y/o male with h/o HTN, HLD, CAD s/p CABG (2012), GERD, CHF, PUD and CKD who was admitted with GIB and found to have colon mass now s/p robotic assisted laparoscopic right colectomy 8/20.  Met with pt in room today. Pt sitting up in the chair and reports that he did walk around some today. Pt reports good appetite and oral intake at baseline. Pt reports that he is miserable today. Pt complaining of abdominal pain secondary to bloating and tightness from trapped air. Pt is distended on exam. Pt reports that he ate most of his clear liquid tray at lunch but reports that he felt worse afterwards. Pt denies any nausea, flatus or BMs today. Pt made NPO again today. Pt has remained on NPO/clear liquid diet for most of the admission and is now without adequate nutrition for 7 days. Plan is for PICC line and TPN initiation tomorrow. Pt is at high refeed risk. Pt down ~7lbs from his UBW on admit; no new weight since 8/17. Will monitor daily weights. Pt is agreeable to supplements with diet advancement.   Medications reviewed and include: lovenox , ferrous sulfate , protonix , simethicone , iron  sucrose, LRS @75ml /hr   Labs reviewed: K 4.7 wnl, creat 1.29(H), P 3.8 wnl, Mg 2.0 wnl  Ferritin- 8(L)- 8/15 Hgb 8.2(L), Hct 29.3(L), MCV  71.3(L), MCH 20.0(L), MCHC 28.0(L) Cbgs- 109, 98 x 48 hrs   NUTRITION - FOCUSED PHYSICAL EXAM:  Flowsheet Row Most Recent Value  Orbital Region No depletion  Upper Arm Region No depletion  Thoracic and Lumbar Region No depletion  Buccal Region No depletion  Temple Region No depletion  Clavicle Bone Region Mild depletion  Clavicle and Acromion Bone Region Mild depletion  Scapular Bone Region No depletion  Dorsal Hand Mild depletion  Patellar Region No depletion  Anterior Thigh Region No depletion  Posterior Calf Region No depletion  Edema (RD Assessment) Mild  Hair Reviewed  Eyes Reviewed  Mouth Reviewed  Skin Reviewed  Nails Reviewed   Diet Order:   Diet Order             Diet clear liquid Room service appropriate? Yes; Fluid consistency: Thin  Diet effective now                  EDUCATION NEEDS:   Education needs have been addressed  Skin:  Skin Assessment: Reviewed RN Assessment (incision abdomen)  Last BM:  8/20- type 7  Height:   Ht Readings from Last 1 Encounters:  11/18/23 5' 6 (1.676 m)    Weight:   Wt Readings from Last 1 Encounters:  11/18/23 81.6 kg    Ideal Body Weight:  64.5 kg  BMI:  Body mass index is 29.04 kg/m.  Estimated Nutritional Needs:   Kcal:  1900-2200kcal/day  Protein:  95-110g/day  Fluid:  1.7-1.9L/day  Augustin Shams MS, RD, LDN If unable to be reached, please send secure chat to RD inpatient available from 8:00a-4:00p daily

## 2023-11-22 NOTE — Progress Notes (Signed)
 [facility]- General Surgery  SURGICAL PROGRESS NOTE  Hospital Day(s): 6.   Post op day(s): 1 Day Post-Op.   Interval History:  Patient is overall doing well. States he was in a lot of pain yesterday but pain has improved.  So far tolerating clear liquid diet.  Denies any nausea or vomiting.  Has not had a bowel movement or experience flatulence.   Vital signs in last 24 hours: [min-max] current  Temp:  [97.3 F (36.3 C)-98.1 F (36.7 C)] 98 F (36.7 C) (08/21 0448) Pulse Rate:  [59-99] 76 (08/21 0448) Resp:  [15-31] 18 (08/21 0448) BP: (123-170)/(64-81) 123/64 (08/21 0448) SpO2:  [94 %-100 %] 94 % (08/21 0448)     Height: 5' 6 (167.6 cm) Weight: 81.6 kg BMI (Calculated): 29.05   Intake/Output last 2 shifts:  08/20 0701 - 08/21 0700 In: 1400 [I.V.:1100; IV Piggyback:300] Out: 1015 [Urine:865; Blood:150]   Physical Exam:  Constitutional: alert, cooperative and no distress  Respiratory: breathing non-labored at rest  Cardiovascular: regular rate and sinus rhythm  Gastrointestinal: soft, mildly tender, and non-distended, incisions are clean dry with surgical glue intact  Labs:     Latest Ref Rng & Units 11/21/2023    4:29 AM 11/20/2023    3:56 AM 11/19/2023    4:09 AM  CBC  WBC 4.0 - 10.5 K/uL 7.4  9.7  11.7   Hemoglobin 13.0 - 17.0 g/dL 8.7  8.8  8.7   Hematocrit 39.0 - 52.0 % 30.6  30.9  30.1   Platelets 150 - 400 K/uL 325  289  293       Latest Ref Rng & Units 11/22/2023    4:03 AM 11/21/2023    4:29 AM 11/20/2023    3:56 AM  CMP  Glucose 70 - 99 mg/dL 890  98  897   BUN 8 - 23 mg/dL 11  13  16    Creatinine 0.61 - 1.24 mg/dL 8.70  8.85  8.74   Sodium 135 - 145 mmol/L 137  138  135   Potassium 3.5 - 5.1 mmol/L 4.7  4.1  4.0   Chloride 98 - 111 mmol/L 106  109  104   CO2 22 - 32 mmol/L 23  24  24    Calcium  8.9 - 10.3 mg/dL 8.9  9.2  9.1     Imaging studies: No new pertinent imaging studies   Assessment/Plan:  78 y.o. male with right colon mass 1 Day Post-Op  s/p robotic assisted laparoscopic right colectomy,  complicated by pertinent comorbidities including hypertension, hyperlipidemia, GERD, CKD, and CAD s/p CABG in 2012.    - Stable vital signs, no fever not tachycardic  - Continue clear liquid diet until patient has return of GI function  - Will order CBC to check for leukocytosis and hemoglobin levels  - Continue pain management   - Continue DVT prophylaxis with Lovenox   -- Launa Goedken Barrientos PA-C

## 2023-11-22 NOTE — Progress Notes (Addendum)
  Progress Note   Patient: Matthew Humphrey FMW:[id number] DOB: 01-22-1946 DOA: 11/16/2023     6 DOS: the patient was seen and examined on 11/22/2023   Brief hospital course: 78 y.o. M with HTN, HLD, dCHF, CAD s/p CABG 2011 presented with symptomatic anemia, found to have colon mass.     Underwent EGD/colonoscopy 8/17 that showed large colon mass.  Biopsies sent.   I assumed care on 11/21/23.  8/20 -- underwent robotic assisted laparoscopic right colectomy with Dr. Tye   Assessment and Plan:  Colon mass/severe IDA Patient admitted with anemia, found to have large colon mass.  Underwent EGD and colonoscopy that confirmed same, biopsies sent. On CT, general surgery suspect that he has lymph node metastases, and so are recommending colectomy during this hospitalization - General surgery following -- Dr Tye -- Status post hemicolectomy on 11/21/23 -- Clear liquid diet -- advancement per surgery -- Gentle IV fluids until PO intake increases -- Pain control and bowel regimen PRN - Dr. Brahmanday/oncology aware of patient --colon biopsy invasive tubular adenoma with high grade dysplasia.   Chronic blood loss anemia Patient admitted and underwent endoscopy and colonoscopy by Dr. Onita GI.  This showed large colon mass, likely cause of iron  deficiency anemia - Continue ferrous sulfate    Acute combined systolic and diastolic congestive heart failure (HCC) Found to have elevated BNP, right-sided infiltrate and effusion, shortness of breath.   --Echocardiogram shows EF 45-50%.  Treated with IV Lasix , now appears euvolemic --sats stable at RA --[facility] cardiology signed off --cont present meds   Coronary artery disease involving autologous vein coronary bypass graft with chronic angina pectoris Hyperlipidemia Hypertension  Blood pressure normal - Continue amlodipine , Imdur , metoprolol  - Hold aspirin  given anemia - Continue Lipitor       Subjective: Pt awake sitting up in bed on AM  rounds.  He reports pain overall controlled.  Slept on and off due to staff coming in and pain at times.  Tolerating clear liquids.  No N/V or F/C.  No other acute complaints.    Physical Exam: Vitals:   11/21/23 2041 11/22/23 0448 11/22/23 0902 11/22/23 0906  BP: 138/72 123/64 119/66 (!) 108/50  Pulse: 82 76 82 80  Resp: 20 18  18   Temp: 98.1 F (36.7 C) 98 F (36.7 C) 98.3 F (36.8 C) 98.2 F (36.8 C)  TempSrc:   Oral   SpO2: 94% 94% 93% 97%  Weight:      Height:       General exam: awake, alert, no acute distress  Respiratory system: CTAB, no wheezes, rales or rhonchi, normal respiratory effort. Cardiovascular system: normal S1/S2, RRR Gastrointestinal system: soft, NT, ND, +BS, incisions with intact glue Central nervous system: no gross focal neurologic deficits, normal speech Extremities: moves all , no edema, normal tone Skin: dry, intact, no rashes seen on visualized skin   Data Reviewed:  Notable labs -- Cr 1.14 >> 1.29, glucose 109.  Hbg 8.7 >> 8.2   Family Communication: None present   Disposition: Status is: Inpatient Remains inpatient appropriate because: pending clearance by general surgery, status-post hemicolectomy on 8/20.    Planned Discharge Destination: Home with Home Health     Time spent: 42 minutes  Author: Burnard DELENA Cunning, DO 11/22/2023 12:07 PM  For on call review [email or web address].

## 2023-11-22 NOTE — Plan of Care (Signed)

## 2023-11-23 DIAGNOSIS — D62 Acute posthemorrhagic anemia: Secondary | ICD-10-CM | POA: Diagnosis not present

## 2023-11-23 LAB — CBC
HCT: 29.3 % — ABNORMAL LOW (ref 39.0–52.0)
Hemoglobin: 8.2 g/dL — ABNORMAL LOW (ref 13.0–17.0)
MCH: 20.4 pg — ABNORMAL LOW (ref 26.0–34.0)
MCHC: 28 g/dL — ABNORMAL LOW (ref 30.0–36.0)
MCV: 73.1 fL — ABNORMAL LOW (ref 80.0–100.0)
Platelets: 342 K/uL (ref 150–400)
RBC: 4.01 MIL/uL — ABNORMAL LOW (ref 4.22–5.81)
RDW: 23.9 % — ABNORMAL HIGH (ref 11.5–15.5)
WBC: 7.8 K/uL (ref 4.0–10.5)
nRBC: 0.6 % — ABNORMAL HIGH (ref 0.0–0.2)

## 2023-11-23 LAB — BASIC METABOLIC PANEL WITH GFR
Anion gap: 8 (ref 5–15)
BUN: 11 mg/dL (ref 8–23)
CO2: 25 mmol/L (ref 22–32)
Calcium: 9.2 mg/dL (ref 8.9–10.3)
Chloride: 104 mmol/L (ref 98–111)
Creatinine, Ser: 1.14 mg/dL (ref 0.61–1.24)
GFR, Estimated: >60 mL/min (ref >60)
Glucose, Bld: 107 mg/dL — ABNORMAL HIGH (ref 70–99)
Potassium: 4.5 mmol/L (ref 3.5–5.1)
Sodium: 137 mmol/L (ref 135–145)

## 2023-11-23 LAB — MAGNESIUM: Magnesium: 1.8 mg/dL (ref 1.7–2.4)

## 2023-11-23 LAB — SURGICAL PATHOLOGY

## 2023-11-23 LAB — TRIGLYCERIDES: Triglycerides: 107 mg/dL (ref <150)

## 2023-11-23 LAB — PHOSPHORUS: Phosphorus: 2.3 mg/dL — ABNORMAL LOW (ref 2.5–4.6)

## 2023-11-23 MED ORDER — CHLORHEXIDINE GLUCONATE CLOTH 2 % EX PADS
6.0000 | MEDICATED_PAD | Freq: Every day | CUTANEOUS | Status: DC
  Administered 2023-11-23 – 2023-11-25 (×3): 6 via TOPICAL

## 2023-11-23 MED ORDER — SODIUM CHLORIDE 0.9% FLUSH: 10.0000 mL | INTRAVENOUS | Status: DC | PRN

## 2023-11-23 MED ORDER — TRACE MINERALS CU-MN-SE-ZN 300-55-60-3000 MCG/ML IV SOLN
INTRAVENOUS | Status: DC
  Filled 2023-11-23: qty 384

## 2023-11-23 MED ORDER — THIAMINE HCL 100 MG/ML IJ SOLN
100.0000 mg | INTRAMUSCULAR | Status: DC
  Administered 2023-11-23 – 2023-11-24 (×2): 100 mg via INTRAVENOUS
  Filled 2023-11-23 (×2): qty 2

## 2023-11-23 MED ORDER — INSULIN ASPART 100 UNIT/ML IJ SOLN: 0.0000 [IU] | Freq: Four times a day (QID) | INTRAMUSCULAR | Status: DC

## 2023-11-23 NOTE — Progress Notes (Addendum)
  Progress Note   Patient: Matthew Humphrey FMW:[id number] DOB: 1945/05/08 DOA: 11/16/2023     7 DOS: the patient was seen and examined on 11/23/2023   Brief hospital course: 78 y.o. M with HTN, HLD, dCHF, CAD s/p CABG 2011 presented with symptomatic anemia, found to have colon mass.     Underwent EGD/colonoscopy 8/17 that showed large colon mass.  Biopsies sent.   I assumed care on 11/21/23.  8/20 -- underwent robotic assisted laparoscopic right colectomy with Dr. Tye   Assessment and Plan:  Colon mass/severe IDA Patient admitted with anemia, found to have large colon mass.  Underwent EGD and colonoscopy that confirmed same, biopsies sent. On CT, general surgery suspect that he has lymph node metastases, and so are recommending colectomy during this hospitalization - General surgery following -- Dr Tye -- Status post hemicolectomy on 11/21/23 -- Diet advanced to full liquids -- advancement per surgery -- Per Surgery, given return of bowel function and advancing diet, no need to initiate TPN as was discussed yesterday afternoon. -- Pain control and bowel regimen PRN - Dr. Brahmanday/oncology aware of patient --colon biopsy invasive tubular adenoma with high grade dysplasia.   Chronic blood loss anemia Patient admitted and underwent endoscopy and colonoscopy by Dr. Onita GI.  This showed large colon mass, likely cause of iron  deficiency anemia - Continue ferrous sulfate    Acute combined systolic and diastolic congestive heart failure (HCC) Found to have elevated BNP, right-sided infiltrate and effusion, shortness of breath.   --Echocardiogram shows EF 45-50%.  Treated with IV Lasix , now appears euvolemic --sats stable at [facility] --[facility] cardiology signed off --cont present meds   Coronary artery disease involving autologous vein coronary bypass graft with chronic angina pectoris Hyperlipidemia Hypertension  Blood pressure normal - Continue amlodipine , Imdur , metoprolol  - Hold  aspirin  given anemia - Continue Lipitor       Subjective: Pt up in recliner when seen this AM.  He reports some intermittent abdominal pain.  Tolerating clear liquids and hopes diet will be advanced, states this was mentioned during surgery's visit earlier.  No other acute complaints or issues reported.   Physical Exam: Vitals:   11/22/23 2013 11/23/23 0344 11/23/23 0407 11/23/23 0730  BP: 133/70 109/61  (!) 153/72  Pulse: 86 70  72  Resp: 20 20  16   Temp: 97.9 F (36.6 C) 98.3 F (36.8 C)  98 F (36.7 C)  TempSrc:      SpO2: 91% 93%  95%  Weight:   81.2 kg   Height:       General exam: awake, alert, no acute distress  Respiratory system: CTAB, no wheezes, rales or rhonchi, normal respiratory effort. Cardiovascular system: normal S1/S2, RRR Gastrointestinal system: soft, NT, ND, +BS Central nervous system: no gross focal neurologic deficits, normal speech Extremities: moves all , no edema, normal tone Skin: dry, intact, no rashes seen on visualized skin   Data Reviewed:  Notable labs -- Cr 1.14, glucose 107. Phos 2.3  Hbg 8.7 >> 8.2 >> 8.2   Family Communication: None present, will attempt to call as time allows.   Disposition: Status is: Inpatient Remains inpatient appropriate because: pending clearance by general surgery, status-post hemicolectomy on 8/20.    Planned Discharge Destination: Home with Home Health     Time spent: 38 minutes  Author: Burnard DELENA Cunning, DO 11/23/2023 1:10 PM  For on call review [email or web address].

## 2023-11-23 NOTE — Progress Notes (Signed)
 [facility]- General Surgery  SURGICAL PROGRESS NOTE  Hospital Day(s): 7.   Post op day(s): 2 Days Post-Op.   Interval History:  Patient seen and examined. Reports doing well overall. Was having some abdominal discomfort yesterday after having clear liquids for lunch. He was placed NPO. States feeling much better this morning. Has been ambulating with minimal pain. States he had a small bowel movement, no blood noted.  Denies any nausea or vomiting.   Vital signs in last 24 hours: [min-max] current  Temp:  [97.9 F (36.6 C)-98.3 F (36.8 C)] 98 F (36.7 C) (08/22 0730) Pulse Rate:  [70-86] 72 (08/22 0730) Resp:  [16-20] 16 (08/22 0730) BP: (109-153)/(61-72) 153/72 (08/22 0730) SpO2:  [91 %-95 %] 95 % (08/22 0730) Weight:  [81.2 kg] 81.2 kg (08/22 0407)     Height: 5' 6 (167.6 cm) Weight: 81.2 kg BMI (Calculated): 28.91   Intake/Output last 2 shifts:  08/21 0701 - 08/22 0700 In: 2100.9 [P.O.:240; I.V.:1222; IV Piggyback:638.9] Out: 850 [Urine:850]   Physical Exam:  Constitutional: alert, cooperative and no distress  Respiratory: breathing non-labored at rest  Cardiovascular: regular rate and sinus rhythm  Gastrointestinal: soft, mildly tender, and non-distended  Labs:     Latest Ref Rng & Units 11/23/2023    3:41 AM 11/22/2023    8:53 AM 11/21/2023    4:29 AM  CBC  WBC 4.0 - 10.5 K/uL 7.8  9.0  7.4   Hemoglobin 13.0 - 17.0 g/dL 8.2  8.2  8.7   Hematocrit 39.0 - 52.0 % 29.3  29.3  30.6   Platelets 150 - 400 K/uL 342  328  325       Latest Ref Rng & Units 11/23/2023    3:41 AM 11/22/2023    4:03 AM 11/21/2023    4:29 AM  CMP  Glucose 70 - 99 mg/dL 892  890  98   BUN 8 - 23 mg/dL 11  11  13    Creatinine 0.61 - 1.24 mg/dL 8.85  8.70  8.85   Sodium 135 - 145 mmol/L 137  137  138   Potassium 3.5 - 5.1 mmol/L 4.5  4.7  4.1   Chloride 98 - 111 mmol/L 104  106  109   CO2 22 - 32 mmol/L 25  23  24    Calcium  8.9 - 10.3 mg/dL 9.2  8.9  9.2     Imaging studies: No new  pertinent imaging studies   Assessment/Plan:  78 y.o. male with right colon mass 2 Days Post-Op s/p robotic assisted laparoscopic right colectomy,  complicated by pertinent comorbidities including hypertension, hyperlipidemia, GERD, CKD, and CAD s/p CABG in 2012.    - Stable vital signs, afebrile and not tachycardic  - No leukocytosis with stable Hbg of 8.2  - Clinically improving with return of GI function. Will advance to full liquid diet since he did not tolerate clear liquids yesterday. TPN no longer needed.   - Encouraged to continue to ambulate  - Continue pain management as needed  - Continue DVT prophylaxis with Lovenox    -- Keyri Salberg Barrientos PA-C

## 2023-11-23 NOTE — Progress Notes (Signed)
 Peripherally Inserted Central Catheter Placement  The IV Nurse has discussed with the patient and/or persons authorized to consent for the patient, the purpose of this procedure and the potential benefits and risks involved with this procedure.  The benefits include less needle sticks, lab draws from the catheter, and the patient may be discharged home with the catheter. Risks include, but not limited to, infection, bleeding, blood clot (thrombus formation), and puncture of an artery; nerve damage and irregular heartbeat and possibility to perform a PICC exchange if needed/ordered by physician.  Alternatives to this procedure were also discussed.  Bard Power PICC patient education guide, fact sheet on infection prevention and patient information card has been provided to patient /or left at bedside.    PICC Placement Documentation  PICC Double Lumen 11/23/23 Right Basilic 36 cm 1 cm (Active)  Indication for Insertion or Continuance of Line Administration of hyperosmolar/irritating solutions (i.e. TPN, Vancomycin, etc.) 11/23/23 0930  Exposed Catheter (cm) 1 cm 11/23/23 0930  Site Assessment Clean, Dry, Intact 11/23/23 0930  Lumen #1 Status Flushed;Saline locked;Blood return noted 11/23/23 0930  Lumen #2 Status Flushed;Saline locked;Blood return noted 11/23/23 0930  Dressing Type Transparent;Securing device 11/23/23 0930  Dressing Status Antimicrobial disc/dressing in place;Clean, Dry, Intact 11/23/23 0930  Line Care Connections checked and tightened 11/23/23 0930  Line Adjustment (NICU/IV Team Only) No 11/23/23 0930  Dressing Intervention New dressing;Adhesive placed at insertion site (IV team only) 11/23/23 0930  Dressing Change Due 11/30/23 11/23/23 0930       Jolee Na 11/23/2023, 9:38 AM

## 2023-11-23 NOTE — Plan of Care (Signed)
  Problem: Education: Goal: Individualized Educational Video(s) Outcome: Progressing   Problem: Activity: Goal: Ability to tolerate increased activity will improve Outcome: Progressing   Problem: Cardiac: Goal: Ability to achieve and maintain adequate cardiovascular perfusion will improve Outcome: Progressing   Problem: Health Behavior/Discharge Planning: Goal: Ability to safely manage health-related needs after discharge will improve Outcome: Progressing   Problem: Education: Goal: Knowledge of General Education information will improve Description: Including pain rating scale, medication(s)/side effects and non-pharmacologic comfort measures Outcome: Progressing   Problem: Clinical Measurements: Goal: Ability to maintain clinical measurements within normal limits will improve Outcome: Progressing Goal: Will remain free from infection Outcome: Progressing

## 2023-11-23 NOTE — Progress Notes (Signed)
 PHARMACY - TOTAL PARENTERAL NUTRITION CONSULT NOTE   Indication: Prolonged ileus  Patient Measurements: Height: 5' 6 (167.6 cm) Weight: 81.2 kg (179 lb 0.2 oz) IBW/kg (Calculated) : 63.8 TPN AdjBW (KG): 68.3 Body mass index is 28.89 kg/m. Usual Weight: 84.8 kg  Assessment: 78 y/o male with h/o HTN, HLD, CAD s/p CABG (2012), GERD, CHF, PUD and CKD who was admitted with GIB and found to have colon mass now s/p robotic assisted laparoscopic right colectomy 8/20.   Glucose / Insulin :  BG 98-109 Not currently on SSI Electrolytes:  Phos low at 2.3, all other electrolytes WNL Renal: Scr 1.14 (baseline) Hepatic: LFTs WNL, TG 107 Intake / Output; MIVF: Net + 1,250.9; since admission +6,636 GI Imaging: 8/18 CT Chest/Abdomen/Pelvis: Cecal/ascending colon mass with small metastatic ileocolic mesenteric lymph nodes, compatible with primary colon carcinoma. No evidence of distant metastatic disease. GI Surgeries / Procedures:  8/17 Colonoscopy 8/17 Upper Endoscopy 8/20 Right colectomy  Central access: PICC line ordered to be placed 8/22 AM TPN start date: 8/22  Nutritional Goals: Goal TPN rate is 75 mL/hr (provides 108 g of protein and 1983.6 kcals per day)  RD Assessment: Estimated Needs Total Energy Estimated Needs: 1900-2200kcal/day Total Protein Estimated Needs: 95-110g/day Total Fluid Estimated Needs: 1.7-1.9L/day  Current Nutrition:  NPO  Plan:  Start TPN at 40 mL/hr at 1800 Electrolytes in TPN: Na 43mEq/L, K 50mEq/L, Ca 66mEq/L, Mg 67mEq/L, and Phos 15mmol/L. Cl:Ac 1:1 Add standard MVI and trace elements to TPN Initiate Sensitive q6h SSI and adjust as needed  Give thiamine  100mg  IV daily outside of TPN x 5 days  Patient is at high refeed risk, monitor K, Mag, and Phos daily until stable  Monitor TPN labs on Mon/Thurs  Lum VEAR Mania, PharmD, BCPS 11/23/2023,7:24 AM

## 2023-11-23 NOTE — Plan of Care (Signed)

## 2023-11-24 DIAGNOSIS — D62 Acute posthemorrhagic anemia: Secondary | ICD-10-CM | POA: Diagnosis not present

## 2023-11-24 LAB — BASIC METABOLIC PANEL WITH GFR
Anion gap: 8 (ref 5–15)
BUN: 8 mg/dL (ref 8–23)
CO2: 26 mmol/L (ref 22–32)
Calcium: 9 mg/dL (ref 8.9–10.3)
Chloride: 104 mmol/L (ref 98–111)
Creatinine, Ser: 1.22 mg/dL (ref 0.61–1.24)
GFR, Estimated: >60 mL/min (ref >60)
Glucose, Bld: 96 mg/dL (ref 70–99)
Potassium: 3.8 mmol/L (ref 3.5–5.1)
Sodium: 138 mmol/L (ref 135–145)

## 2023-11-24 LAB — CBC
HCT: 28.4 % — ABNORMAL LOW (ref 39.0–52.0)
Hemoglobin: 8.1 g/dL — ABNORMAL LOW (ref 13.0–17.0)
MCH: 20.7 pg — ABNORMAL LOW (ref 26.0–34.0)
MCHC: 28.5 g/dL — ABNORMAL LOW (ref 30.0–36.0)
MCV: 72.4 fL — ABNORMAL LOW (ref 80.0–100.0)
Platelets: 314 K/uL (ref 150–400)
RBC: 3.92 MIL/uL — ABNORMAL LOW (ref 4.22–5.81)
RDW: 24.2 % — ABNORMAL HIGH (ref 11.5–15.5)
WBC: 8 K/uL (ref 4.0–10.5)
nRBC: 0.4 % — ABNORMAL HIGH (ref 0.0–0.2)

## 2023-11-24 LAB — MAGNESIUM: Magnesium: 1.7 mg/dL (ref 1.7–2.4)

## 2023-11-24 LAB — GLUCOSE, CAPILLARY
Glucose-Capillary: 110 mg/dL — ABNORMAL HIGH (ref 70–99)
Glucose-Capillary: 120 mg/dL — ABNORMAL HIGH (ref 70–99)

## 2023-11-24 LAB — PHOSPHORUS: Phosphorus: 1.9 mg/dL — ABNORMAL LOW (ref 2.5–4.6)

## 2023-11-24 MED ORDER — POTASSIUM PHOSPHATES 15 MMOLE/5ML IV SOLN
30.0000 mmol | Freq: Once | INTRAVENOUS | Status: AC
  Administered 2023-11-24: 30 mmol via INTRAVENOUS
  Filled 2023-11-24: qty 10

## 2023-11-24 NOTE — Plan of Care (Signed)

## 2023-11-24 NOTE — Progress Notes (Signed)
 CC: s/p robo r colectomy Subjective: Doing well, tolerating diet and having bowel fx, still w some pain Avss Objective: Vital signs in last 24 hours: Temp:  [98 F (36.7 C)-99.3 F (37.4 C)] 98.5 F (36.9 C) (08/23 0809) Pulse Rate:  [71-75] 74 (08/23 0809) Resp:  [16-20] 18 (08/23 0809) BP: (141-152)/(62-68) 152/67 (08/23 0809) SpO2:  [94 %-99 %] 95 % (08/23 0809) Weight:  [82.2 kg] 82.2 kg (08/23 0423) Last BM Date : 11/23/23 (per patient - small liquid BM at around 4pm)  Intake/Output from previous day: 08/22 0701 - 08/23 0700 In: 480 [P.O.:480] Out: -  Intake/Output this shift: Total I/O In: 240 [P.O.:240] Out: -   Physical exam:  NAD alert Abd: soft, incision c/d/I, not peritonitic, appropriate incisional tenderness   Lab Results: CBC  Recent Labs    11/23/23 0341 11/24/23 0512  WBC 7.8 8.0  HGB 8.2* 8.1*  HCT 29.3* 28.4*  PLT 342 314   BMET Recent Labs    11/23/23 0341 11/24/23 0512  NA 137 138  K 4.5 3.8  CL 104 104  CO2 25 26  GLUCOSE 107* 96  BUN 11 8  CREATININE 1.14 1.22  CALCIUM  9.2 9.0   PT/INR No results for input(s): LABPROT, INR in the last 72 hours. ABG No results for input(s): PHART, HCO3 in the last 72 hours.  Invalid input(s): PCO2, PO2  Studies/Results: US  EKG SITE RITE Result Date: 11/22/2023 If Site Rite image not attached, placement could not be confirmed due to current cardiac rhythm.  US  EKG SITE RITE Result Date: 11/22/2023 If [facility] image not attached, placement could not be confirmed due to current cardiac rhythm.   Anti-infectives: Anti-infectives (From admission, onward)    Start     Dose/Rate Route Frequency Ordered Stop   11/21/23 1045  cefoTEtan  (CEFOTAN ) 1 g in sodium chloride  0.9 % 100 mL IVPB  Status:  Discontinued        1 g 200 mL/hr over 30 Minutes Intravenous  Once 11/21/23 1034 11/21/23 1549   11/21/23 1000  cefoTEtan  (CEFOTAN ) 1 g in sodium chloride  0.9 % 100 mL IVPB  Status:   Discontinued       Note to Pharmacy: Case scheduled for 1015 start   1 g 200 mL/hr over 30 Minutes Intravenous Every 12 hours 11/19/23 1628 11/21/23 1549   11/20/23 1000  neomycin  (MYCIFRADIN ) tablet 500 mg        500 mg Oral Every 4 hours 11/19/23 1536 11/20/23 1913   11/20/23 1000  metroNIDAZOLE  (FLAGYL ) tablet 500 mg        500 mg Oral Every 4 hours 11/19/23 1536 11/20/23 1914       Assessment/Plan: POD # 3 Doing well  Not ready for DC today but wuill plan on DC in am   Laneta Luna, MD, FACS  11/24/2023

## 2023-11-24 NOTE — Plan of Care (Signed)
  Problem: Education: Goal: Understanding of cardiac disease, CV risk reduction, and recovery process will improve Outcome: Progressing Goal: Individualized Educational Video(s) Outcome: Progressing   Problem: Activity: Goal: Ability to tolerate increased activity will improve Outcome: Progressing   Problem: Cardiac: Goal: Ability to achieve and maintain adequate cardiovascular perfusion will improve Outcome: Progressing   Problem: Health Behavior/Discharge Planning: Goal: Ability to safely manage health-related needs after discharge will improve Outcome: Progressing   Problem: Education: Goal: Knowledge of General Education information will improve Description: Including pain rating scale, medication(s)/side effects and non-pharmacologic comfort measures Outcome: Progressing   Problem: Health Behavior/Discharge Planning: Goal: Ability to manage health-related needs will improve Outcome: Progressing   Problem: Clinical Measurements: Goal: Ability to maintain clinical measurements within normal limits will improve Outcome: Progressing

## 2023-11-24 NOTE — Progress Notes (Signed)
  Progress Note   Patient: Matthew Humphrey FMW:[id number] DOB: 07-18-1945 DOA: 11/16/2023     8 DOS: the patient was seen and examined on 11/24/2023   Brief hospital course: 78 y.o. M with HTN, HLD, dCHF, CAD s/p CABG 2011 presented with symptomatic anemia, found to have colon mass.     Underwent EGD/colonoscopy 8/17 that showed large colon mass.  Biopsies sent.   I assumed care on 11/21/23.  8/20 -- underwent robotic assisted laparoscopic right colectomy with Dr. Tye   Assessment and Plan:  Colon mass/severe IDA Patient admitted with anemia, found to have large colon mass.  Underwent EGD and colonoscopy that confirmed same, biopsies sent. On CT, general surgery suspect that he has lymph node metastases, and so are recommending colectomy during this hospitalization - General surgery following -- Dr Tye -- Status post hemicolectomy on 11/21/23 -- On full liquids -- diet advancement per surgery -- Per Surgery, given return of bowel function and advancing diet, no need to initiate TPN as was discussed yesterday afternoon. -- Pain control and bowel regimen PRN - Dr. Brahmanday/oncology aware of patient --colon biopsy invasive tubular adenoma with high grade dysplasia.   Chronic blood loss anemia Patient admitted and underwent endoscopy and colonoscopy by Dr. Onita GI.  This showed large colon mass, likely cause of iron  deficiency anemia - Continue ferrous sulfate    Acute combined systolic and diastolic congestive heart failure (HCC) Found to have elevated BNP, right-sided infiltrate and effusion, shortness of breath.   --Echocardiogram shows EF 45-50%.  Treated with IV Lasix , now appears euvolemic --sats stable at [facility] --[facility] cardiology signed off --cont present meds   Coronary artery disease involving autologous vein coronary bypass graft with chronic angina pectoris Hyperlipidemia Hypertension  Blood pressure normal - Continue amlodipine , Imdur , metoprolol  - Hold aspirin   given anemia - Continue Lipitor       Subjective: Pt up in recliner when seen this AM. He reports intermittent cramping abdominal pains.  Tolerating full liquids.  Feels ready to try more normal foods, and hopes diet to be advanced soon.  He states surgery was in early and told him he could maybe go home tomorrow.   Physical Exam: Vitals:   11/23/23 2012 11/24/23 0340 11/24/23 0423 11/24/23 0809  BP: (!) 141/68 (!) 141/68  (!) 152/67  Pulse: 75 71  74  Resp: 20 16  18   Temp: 98 F (36.7 C) 98.2 F (36.8 C)  98.5 F (36.9 C)  TempSrc:      SpO2: 99% 96%  95%  Weight:   82.2 kg   Height:       General exam: awake, alert, no acute distress  Respiratory system: CTAB, no wheezes, rales or rhonchi, normal respiratory effort. Cardiovascular system: normal S1/S2, RRR Gastrointestinal system: distended by non-tender Central nervous system: no gross focal neurologic deficits, normal speech Extremities: moves all , no edema, normal tone Skin: dry, intact, no rashes seen on visualized skin   Data Reviewed:  Notable labs -- Cr 1.22, Phos 1.9,  Hbg 8.7 >> 8.2 >> 8.2 >> 8.1   Family Communication: None present, will attempt to call as time allows.   Disposition: Status is: Inpatient Remains inpatient appropriate because: pending clearance by general surgery, status-post hemicolectomy on 8/20.    Planned Discharge Destination: Home with Home Health     Time spent: 38 minutes  Author: Burnard DELENA Cunning, DO 11/24/2023 1:04 PM  For on call review [email or web address].

## 2023-11-25 DIAGNOSIS — D649 Anemia, unspecified: Secondary | ICD-10-CM

## 2023-11-25 LAB — BASIC METABOLIC PANEL WITH GFR
Anion gap: 3 — ABNORMAL LOW (ref 5–15)
BUN: 6 mg/dL — ABNORMAL LOW (ref 8–23)
CO2: 29 mmol/L (ref 22–32)
Calcium: 9 mg/dL (ref 8.9–10.3)
Chloride: 107 mmol/L (ref 98–111)
Creatinine, Ser: 1.25 mg/dL — ABNORMAL HIGH (ref 0.61–1.24)
GFR, Estimated: 59 mL/min — ABNORMAL LOW (ref >60)
Glucose, Bld: 99 mg/dL (ref 70–99)
Potassium: 4.1 mmol/L (ref 3.5–5.1)
Sodium: 139 mmol/L (ref 135–145)

## 2023-11-25 LAB — MAGNESIUM: Magnesium: 1.7 mg/dL (ref 1.7–2.4)

## 2023-11-25 LAB — PHOSPHORUS: Phosphorus: 2.5 mg/dL (ref 2.5–4.6)

## 2023-11-25 LAB — GLUCOSE, CAPILLARY
Glucose-Capillary: 102 mg/dL — ABNORMAL HIGH (ref 70–99)
Glucose-Capillary: 91 mg/dL (ref 70–99)
Glucose-Capillary: 99 mg/dL (ref 70–99)

## 2023-11-25 MED ORDER — ONDANSETRON 4 MG PO TBDP: 4.0000 mg | ORAL_TABLET | Freq: Three times a day (TID) | ORAL | 0 refills | Status: AC | PRN

## 2023-11-25 MED ORDER — ACETAMINOPHEN 325 MG PO TABS
650.0000 mg | ORAL_TABLET | ORAL | Status: AC | PRN
Start: 2023-11-25 — End: ?

## 2023-11-25 MED ORDER — SIMETHICONE 80 MG PO CHEW: 80.0000 mg | CHEWABLE_TABLET | Freq: Four times a day (QID) | ORAL | 0 refills | Status: AC

## 2023-11-25 MED ORDER — FERROUS SULFATE 325 (65 FE) MG PO TABS
325.0000 mg | ORAL_TABLET | ORAL | 0 refills | Status: DC
Start: 2023-11-27 — End: 2024-01-31

## 2023-11-25 MED ORDER — OXYCODONE HCL 5 MG PO TABS: 5.0000 mg | ORAL_TABLET | Freq: Four times a day (QID) | ORAL | 0 refills | Status: DC | PRN

## 2023-11-25 NOTE — Progress Notes (Signed)
 Discharge instructions reviewed with the patient. IV and picc line removed. Patient pushed out in wheelchair to his waiting ride

## 2023-11-25 NOTE — Discharge Summary (Signed)
 Physician Discharge Summary   Patient: Matthew Humphrey MRN: [id number] DOB: [birth year]  Admit date:     11/16/2023  Discharge date: 11/25/2023  Discharge Physician: Burnard DELENA Cunning   PCP: Kotturi, Vinay K, MD   Recommendations at discharge:    Follow up as scheduled with General Surgery Follow up with Oncology as scheduled Follow up with Primary Care in 1-2 weeks Repeat CBC, CMP at follow up   Discharge Diagnoses: Principal Problem:   Symptomatic anemia Active Problems:   Essential hypertension   Hyperlipidemia   Esophageal reflux   Coronary artery disease involving autologous vein coronary bypass graft without angina pectoris   Osteoarthritis of spine with radiculopathy, lumbar region   Heme positive stool   Acute combined systolic and diastolic congestive heart failure (HCC)   Mass of colon  Resolved Problems:   Chronic kidney disease  Hospital Course:  78 y.o. M with HTN, HLD, dCHF, CAD s/p CABG 2011 presented with symptomatic anemia, found to have colon mass.      Underwent EGD/colonoscopy 8/17 that showed large colon mass.  Biopsies sent.     8/20 -- underwent robotic assisted laparoscopic right colectomy with Dr. Tye  Subsequent biopsies returned showing adenocarcinoma, 15 lymph nodes were negative.  Follow up with Oncology is arranged.  Postoperatively, patient doing well, diet was slowly advanced.  Pt had brief post-op ileus that is now resolved.    8/24 -- Pt tolerating soft diet without issues.   Surgery  has cleared pt for discharge home today, pt is agreeable and without acute complaints.    Assessment and Plan:  Colon mass, biopsy confirmed adenocarcinoma Patient admitted with anemia, found to have large colon mass.  Underwent EGD and colonoscopy that confirmed same, biopsies sent confirmed adenocarcinoma. - General surgery consulted -- Dr Tye -- Status post hemicolectomy on 11/21/23 -- Diet advanced to soft - pt tolerating -- Per Surgery,  given return of bowel function and advancing diet, no need to initiate TPN as was discussed yesterday afternoon. -- Pain control and bowel regimen PRN - Dr. Leonides to see in close outpatient follow up    Severe iron  deficiency anemia Chronic blood loss anemia Patient admitted and underwent endoscopy and colonoscopy by Dr. Onita GI.  This showed large colon mass, likely cause of iron  deficiency anemia - Continue ferrous sulfate  -- Heme/Oncology to manage in outpatient setting   Acute combined systolic and diastolic congestive heart failure (HCC) Found to have elevated BNP, right-sided infiltrate and effusion, shortness of breath.   --Echocardiogram shows EF 45-50%.  Treated with IV Lasix , now appears euvolemic --sats stable at RA --[facility] cardiology signed off --cont present meds   Coronary artery disease involving autologous vein coronary bypass graft with chronic angina pectoris Hyperlipidemia Hypertension  Blood pressure normal - Continue amlodipine , Imdur , metoprolol  - Hold aspirin  given anemia - Continue Lipitor        Consultants: Surgery, Oncology Procedures performed: as above  Disposition: Home Diet recommendation:  Soft / low fiber diet   DISCHARGE MEDICATION: Allergies as of 11/25/2023       Reactions   Sulfa Antibiotics Other (See Comments)   Sulfamethoxazole-trimethoprim Other (See Comments)   Other reaction(s): Unknown Other reaction(s): Unknown   Sulfasalazine Other (See Comments)        Medication List     TAKE these medications    acetaminophen  325 MG tablet Commonly known as: TYLENOL  Take 2 tablets (650 mg total) by mouth every 4 (four) hours as needed for headache or  mild pain (pain score 1-3).   amLODipine  5 MG tablet Commonly known as: NORVASC  Take 5 mg by mouth daily.   aspirin  EC 81 MG tablet Take 1 tablet (81 mg total) by mouth daily.   atorvastatin  80 MG tablet Commonly known as: LIPITOR  TAKE 1 TABLET BY MOUTH ONCE   DAILY   Baclofen  5 MG Tabs Take 1 tablet (5 mg total) by mouth 3 (three) times daily as needed.   ferrous sulfate  325 (65 FE) MG tablet Take 1 tablet (325 mg total) by mouth every other day.   isosorbide  mononitrate 30 MG 24 hr tablet Commonly known as: IMDUR  Take 30 mg by mouth daily.   metoprolol  tartrate 25 MG tablet Commonly known as: LOPRESSOR  Take 0.5 tablets (12.5 mg total) by mouth 2 (two) times daily. What changed: how much to take   nitroGLYCERIN  0.4 MG SL tablet Commonly known as: NITROSTAT  Place 1 tablet (0.4 mg total) under the tongue every 5 (five) minutes as needed for chest pain.   ondansetron  4 MG disintegrating tablet Commonly known as: ZOFRAN -ODT Take 1 tablet (4 mg total) by mouth every 8 (eight) hours as needed for nausea or vomiting.   oxyCODONE  5 MG immediate release tablet Commonly known as: Roxicodone  Take 1-2 tablets (5-10 mg total) by mouth every 6 (six) hours as needed for severe pain (pain score 7-10) or moderate pain (pain score 4-6).   pantoprazole  40 MG tablet Commonly known as: PROTONIX  TAKE 1 TABLET BY MOUTH DAILY   simethicone  80 MG chewable tablet Commonly known as: MYLICON Chew 1 tablet (80 mg total) by mouth 4 (four) times daily for 15 days.        Follow-up Information     Callwood, Dwayne D, MD. Go in 2 week(s).   Specialties: Cardiology, Internal Medicine Contact information: [address] [place] [address] [phone]                Discharge Exam: Matthew Humphrey   11/23/23 0407 11/24/23 0423 11/25/23 0500  Weight: 81.2 kg 82.2 kg 81.8 kg   General exam: awake, alert, no acute distress HEENT: atraumatic, clear conjunctiva, anicteric sclera, moist mucus membranes, hearing grossly normal  Respiratory system: CTAB, no wheezes, rales or rhonchi, normal respiratory effort. Cardiovascular system: normal S1/S2, RRR, no JVD, murmurs, rubs, gallops, no pedal edema.   Gastrointestinal system: soft, NT, ND,  no HSM felt, +bowel sounds. Central nervous system: A&O x4. no gross focal neurologic deficits, normal speech Extremities: moves all , no edema, normal tone Skin: dry, intact, normal temperature Psychiatry: normal mood, congruent affect, judgement and insight appear normal   Condition at discharge: stable  The results of significant diagnostics from this hospitalization (including imaging, microbiology, ancillary and laboratory) are listed below for reference.   Imaging Studies: US  EKG SITE RITE Result Date: 11/22/2023 If Site Rite image not attached, placement could not be confirmed due to current cardiac rhythm.  US  EKG SITE RITE Result Date: 11/22/2023 If [facility] image not attached, placement could not be confirmed due to current cardiac rhythm.  CT CHEST ABDOMEN PELVIS W CONTRAST Result Date: 11/19/2023 CLINICAL DATA:  Likely malignant colon mass.  * Tracking Code: BO * EXAM: CT CHEST, ABDOMEN, AND PELVIS WITH CONTRAST TECHNIQUE: Multidetector CT imaging of the chest, abdomen and pelvis was performed following the standard protocol during bolus administration of intravenous contrast. RADIATION DOSE REDUCTION: This exam was performed according to the departmental dose-optimization program which includes automated exposure control, adjustment of the mA  and/or kV according to patient size and/or use of iterative reconstruction technique. CONTRAST:  OMNIPAQUE  IOHEXOL  300 MG/ML  SOLN COMPARISON:  CT chest 01/03/2013 in CT abdomen pelvis 01/19/2010. FINDINGS: CT CHEST FINDINGS Cardiovascular: Atherosclerotic calcification of the aorta, aortic valve and coronary arteries. Heart is at the upper limits of normal in size to mildly enlarged. No pericardial effusion. Mediastinum/Nodes: Mediastinal lymph nodes measure up to 1.3 cm in the AP window. Right hilar lymph node measures 12 mm. No axillary adenopathy. Esophagus is grossly unremarkable. Lungs/Pleura: Centrilobular and paraseptal emphysema.  Chronic bibasilar volume loss. New peribronchovascular nodularity, ground-glass and consolidation in the left upper and left lower lobes. Calcified granulomas. No pleural fluid. Airway is unremarkable. Musculoskeletal: Degenerative changes in the spine. No worrisome lytic or sclerotic lesions. CT ABDOMEN PELVIS FINDINGS Hepatobiliary: Liver and gallbladder are unremarkable. No biliary ductal dilatation. Pancreas: Negative. Spleen: Negative. Adrenals/Urinary Tract: Adrenal glands are unremarkable. Small low-attenuation lesions in the kidneys, too small to characterize. No specific follow-up necessary. Small left renal stone. Ureters are decompressed. Bladder is grossly unremarkable. Stomach/Bowel: Stomach and small bowel unremarkable. Appendix is not readily visualized. Irregular apple-core mass in the cecum and ascending colon measures approximately 8.2 cm in length (coronal image 44). Several small ileocolic mesenteric lymph nodes measure up to 8 mm. Colon is otherwise unremarkable. Vascular/Lymphatic: Atherosclerotic calcification of the aorta. No additional pathologically enlarged lymph nodes. Reproductive: Prostate is visualized. Other: No free fluid. Mesenteries and peritoneum are otherwise unremarkable. Musculoskeletal: Well-circumscribed lucent lesions in the iliac wings are unchanged from 01/19/2010 and considered benign. No worrisome lytic or sclerotic lesions. IMPRESSION: 1. Cecal/ascending colon mass with small metastatic ileocolic mesenteric lymph nodes, compatible with primary colon carcinoma. No evidence of distant metastatic disease. 2. Left upper and left lower lobe pneumonia. Small to borderline enlarged mediastinal lymph nodes are likely reactive in etiology. 3. Small left renal stone. 4. Aortic atherosclerosis (ICD10-I70.0). Coronary artery calcification. 5. Emphysema (ICD10-J43.9). Electronically Signed   By: Newell Eke M.D.   On: 11/19/2023 14:16   ECHOCARDIOGRAM COMPLETE Result Date:  11/17/2023    ECHOCARDIOGRAM REPORT   Patient Name:   JAMARIE MUSSA Date of Exam: 11/17/2023 Medical Rec #:  [id number]          Height:       66.0 in Accession #:    [id number]         Weight:       180.0 lb Date of Birth:  08-Jul-1945           BSA:          1.912 m Patient Age:    78 years           BP:           129/64 mmHg Patient Gender: M                  HR:           57 bpm. Exam Location:  [facility] Procedure: 2D Echo, Cardiac Doppler and Color Doppler (Both Spectral and Color            Flow Doppler were utilized during procedure). Indications:     Dyspnea R06.00  History:         Patient has prior history of Echocardiogram examinations. Risk                  Factors:Hypertension.  Sonographer:     Bari Roar Referring Phys:  [id number] CARALYN HUDSON Diagnosing Phys: Dwayne  JONETTA Lovelace MD IMPRESSIONS  1. Inferior posterior septal hypo.  2. Left ventricular ejection fraction, by estimation, is 45 to 50%. The left ventricle has mildly decreased function. The left ventricle demonstrates regional wall motion abnormalities (see scoring diagram/findings for description). Left ventricular diastolic parameters are consistent with Grade I diastolic dysfunction (impaired relaxation).  3. Right ventricular systolic function is normal. The right ventricular size is normal.  4. The mitral valve is normal in structure. Mild mitral valve regurgitation.  5. The aortic valve is normal in structure. Aortic valve regurgitation is not visualized. FINDINGS  Left Ventricle: Left ventricular ejection fraction, by estimation, is 45 to 50%. The left ventricle has mildly decreased function. The left ventricle demonstrates regional wall motion abnormalities. Strain was performed and the global longitudinal strain is indeterminate. The left ventricular internal cavity size was normal in size. There is borderline concentric left ventricular hypertrophy. Left ventricular diastolic parameters are consistent with Grade I diastolic  dysfunction (impaired relaxation). Right Ventricle: The right ventricular size is normal. No increase in right ventricular wall thickness. Right ventricular systolic function is normal. Left Atrium: Left atrial size was normal in size. Right Atrium: Right atrial size was normal in size. Pericardium: There is no evidence of pericardial effusion. Mitral Valve: The mitral valve is normal in structure. Mild mitral valve regurgitation. MV peak gradient, 4.2 mmHg. The mean mitral valve gradient is 1.0 mmHg. Tricuspid Valve: The tricuspid valve is normal in structure. Tricuspid valve regurgitation is trivial. Aortic Valve: The aortic valve is normal in structure. Aortic valve regurgitation is not visualized. Aortic valve mean gradient measures 2.0 mmHg. Aortic valve peak gradient measures 4.8 mmHg. Aortic valve area, by VTI measures 2.72 cm. Pulmonic Valve: The pulmonic valve was normal in structure. Pulmonic valve regurgitation is not visualized. Aorta: The ascending aorta was not well visualized. IAS/Shunts: No atrial level shunt detected by color flow Doppler. Additional Comments: Inferior posterior septal hypo. 3D was performed not requiring image post processing on an independent workstation and was indeterminate.  LEFT VENTRICLE PLAX 2D LVIDd:         4.30 cm     Diastology LVIDs:         3.20 cm     LV e' medial:    5.11 cm/s LV PW:         1.00 cm     LV E/e' medial:  16.9 LV IVS:        1.30 cm     LV e' lateral:   10.60 cm/s LVOT diam:     1.80 cm     LV E/e' lateral: 8.1 LV SV:         51 LV SV Index:   27 LVOT Area:     2.54 cm  LV Volumes (MOD) LV vol d, MOD A2C: 78.1 ml LV vol d, MOD A4C: 66.1 ml LV vol s, MOD A2C: 42.0 ml LV vol s, MOD A4C: 34.7 ml LV SV MOD A2C:     36.1 ml LV SV MOD A4C:     66.1 ml LV SV MOD BP:      34.4 ml RIGHT VENTRICLE RV Basal diam:  2.80 cm RV Mid diam:    2.30 cm RV S prime:     8.38 cm/s TAPSE (M-mode): 1.4 cm LEFT ATRIUM             Index        RIGHT ATRIUM           Index  LA  diam:        3.90 cm 2.04 cm/m   RA Area:     12.20 cm LA Vol (A2C):   71.0 ml 37.13 ml/m  RA Volume:   25.40 ml  13.28 ml/m LA Vol (A4C):   51.6 ml 26.98 ml/m LA Biplane Vol: 61.9 ml 32.37 ml/m  AORTIC VALVE                    PULMONIC VALVE AV Area (Vmax):    2.17 cm     PV Vmax:        0.87 m/s AV Area (Vmean):   2.28 cm     PV Peak grad:   3.0 mmHg AV Area (VTI):     2.72 cm     RVOT Peak grad: 2 mmHg AV Vmax:           109.00 cm/s AV Vmean:          69.300 cm/s AV VTI:            0.188 m AV Peak Grad:      4.8 mmHg AV Mean Grad:      2.0 mmHg LVOT Vmax:         92.80 cm/s LVOT Vmean:        62.100 cm/s LVOT VTI:          0.201 m LVOT/AV VTI ratio: 1.07  AORTA Ao Root diam: 2.40 cm Ao Asc diam:  3.00 cm MITRAL VALVE                TRICUSPID VALVE MV Area (PHT): 4.77 cm     TR Peak grad:   19.0 mmHg MV Area VTI:   1.84 cm     TR Vmax:        218.00 cm/s MV Peak grad:  4.2 mmHg MV Mean grad:  1.0 mmHg     SHUNTS MV Vmax:       1.02 m/s     Systemic VTI:  0.20 m MV Vmean:      51.1 cm/s    Systemic Diam: 1.80 cm MV Decel Time: 159 msec MV E velocity: 86.20 cm/s MV A velocity: 100.00 cm/s MV E/A ratio:  0.86 MV A Prime:    12.4 cm/s Cara JONETTA Lovelace MD Electronically signed by Cara JONETTA Lovelace MD Signature Date/Time: 11/17/2023/1:15:33 PM    Final    DG Chest 1 View Result Date: 11/16/2023 CLINICAL DATA:  Shortness of breath EXAM: CHEST  1 VIEW COMPARISON:  11/16/2023 FINDINGS: Prior CABG. Heart and mediastinal contours within normal limits. Left lung clear. Right basilar opacity is similar to prior study and could reflect atelectasis or infiltrate. Small right pleural effusions suspected. No acute bony abnormality. IMPRESSION: Right basilar airspace opacity likely reflects atelectasis or infiltrate/pneumonia. Small right pleural effusion. Electronically Signed   By: Franky Crease M.D.   On: 11/16/2023 19:25   DG Chest 2 View Result Date: 11/16/2023 CLINICAL DATA:  Shortness of breath. EXAM: CHEST -  2 VIEW COMPARISON:  01/03/2013. FINDINGS: The heart size and mediastinal contours are within normal limits. Prior median sternotomy and CABG. Streaky bibasilar scarring/subsegmental atelectasis. Suspected small right pleural effusion. No focal consolidation or pneumothorax. Cervical fusion hardware. No acute osseous abnormality. IMPRESSION: 1. Suspected small right pleural effusion. 2.  Streaky bibasilar scarring/subsegmental atelectasis. Electronically Signed   By: Harrietta Sherry M.D.   On: 11/16/2023 10:09    Microbiology: No results found for this or any  previous visit.  Labs: CBC: Recent Labs  Lab 11/30/23 0924 12/05/23 1450  WBC 5.3 5.7  NEUTROABS 3.3 3.4  HGB 10.1* 10.8*  HCT 36.0* 37.9*  MCV 78* 79.8*  PLT 457* 425*   Basic Metabolic Panel: Recent Labs  Lab 11/30/23 0924 12/05/23 1450  NA 141 137  K 4.6 4.4  CL 105 104  CO2 22 24  GLUCOSE 94 111*  BUN 17 31*  CREATININE 1.16 1.26*  CALCIUM  9.6 9.5   Liver Function Tests: Recent Labs  Lab 11/30/23 0924 12/05/23 1450  AST 106* 57*  ALT 129* 74*  ALKPHOS 115 96  BILITOT <0.2 0.4  PROT 6.4 7.4  ALBUMIN 3.6* 3.7   CBG: No results for input(s): GLUCAP in the last 168 hours.   Discharge time spent: less than 30 minutes.  Signed: Burnard DELENA Cunning, DO [facility] 12/05/2023

## 2023-11-26 ENCOUNTER — Telehealth: Payer: Self-pay | Admitting: *Deleted

## 2023-11-26 NOTE — Patient Instructions (Signed)
 Visit Information  Thank you for taking time to visit with me today. Please don't hesitate to contact me if I can be of assistance to you before our next scheduled telephone appointment.  Our next appointment is by telephone on Tuesday, December 04, 2023 at 2:00 pm  Please call the care guide team at [phone] if you need to cancel or reschedule your appointment.   Patient Self Care Activities:  Attend all scheduled provider appointments Call provider office for new concerns or questions  Participate in Transition of Care Program/Attend TOC scheduled calls Take medications as prescribed   Continue pacing activity as your recuperation from recent surgery continues Use assistive devices as needed to prevent falls- your cane Contact your heart doctor and your surgeon to make appointments, as advised when you were released from the hospital If you believe your condition is getting worse- contact your care providers (doctors) promptly- reaching out to your doctor early when you have concerns can prevent you from having to go to the hospital  Following is a copy of your care plan:   Goals Addressed             This Visit's Progress    [facility] Transitions of Care (TOC) Care Plan       Problems:  Recent Hospitalization for treatment of surgery for colon mass No Specialist appointment 11/26/23:  encouraged patient to schedule promptly with surgical provider: he confirms he has contact information for surgical provider and agrees to call soon Recent hospitalization August 15-2- 24, 2025-  for partial lap colectomy secondary to colon mass; acute on chronic blood loss anemia (1) unplanned hospitalization x last (6) and (12) months  Goal:  Over the next 30 days, the patient will not experience hospital readmission  Interventions:  Transitions of Care:  week # 1/ day # 1 Durable Medical Equipment (DME) needs assessed with patient/caregiver Doctor Visits  - discussed the importance of doctor  visits Communication with PCP re: [facility] program enrollment Post discharge activity limitations prescribed by provider reviewed Post-op wound/incision care reviewed with patient/caregiver Reviewed Signs and symptoms of infection Confirmed uses assistive devices on regular basis, at baseline -- cane Provided education/ reinforcement around benefit of conservative post-op activity; need to pace activity without over-doing- benefits of ambulating around home several times per day Reviewed upcoming provider office visits PCP 11/30/23: confirmed patient is aware of all and has plans to attend as scheduled  Surgery (partial lap colectomy secondary to colon mass): Evaluation of current treatment plan related to abdominal- colectomy surgery assessed patient/caregiver understanding of surgical procedure   reviewed post-operative instructions with patient/caregiver addressed questions about post - surgical incision care  reviewed scheduled provider appointments with patient PCP - 11/30/23; encouraged to schedule promptly with surgical provider; he verbalizes agreement, states will do  confirmed availability of transportation to all appointments patient reports he is able to drive self: they didn't tell me not to drive; this was confirmed by review of EHR: unable to verify presence of post-op driving restrictions: encouraged patient to be very conservative and not to drive at all if he is taking narcotic pain medication: confirmed he has not yet taken any narcotic pain medicine   Patient Self Care Activities:  Attend all scheduled provider appointments Call provider office for new concerns or questions  Participate in Transition of Care Program/Attend TOC scheduled calls Take medications as prescribed   Continue pacing activity as your recuperation from recent surgery continues Use assistive devices as needed to prevent falls-  your cane Contact your heart doctor and your surgeon to make appointments, as  advised when you were released from the hospital If you believe your condition is getting worse- contact your care providers (doctors) promptly- reaching out to your doctor early when you have concerns can prevent you from having to go to the hospital  Plan:  Telephone follow up appointment with care management team member scheduled for:  Tuesday 12/04/23 at 2:00 pm       The patient verbalized understanding of instructions, educational materials, and care plan provided today and DECLINED offer to receive copy of patient instructions, educational materials, and care plan.   If you are experiencing a Mental Health or Behavioral Health Crisis or need someone to talk to, please  call the Suicide and Crisis Lifeline: 988 call the [place]  National Suicide Prevention Lifeline: 917-083-2622 or TTY: 248-104-3100 TTY 732 379 9310) to talk to a trained counselor call 1-800-273-TALK (toll free, 24 hour hotline) go to [facility] [address] [place] [phone]) call the [facility] Crisis Line: (726)563-4466 call 911   Beatris Blinda Lawrence, RN, BSN, CCRN Alumnus RN Care Manager  Transitions of Care  [facility] - Population Health  [place] [phone]: direct office

## 2023-11-26 NOTE — Transitions of Care (Post Inpatient/ED Visit) (Signed)
 11/26/2023  Name: Matthew Humphrey MRN: [id number] DOB: 05-Jun-1945  Today's TOC FU Call Status: Today's TOC FU Call Status:: Successful TOC FU Call Completed TOC FU Call Complete Date: 11/26/23 Patient's Name and Date of Birth confirmed.  Transition Care Management Follow-up Telephone Call Date of Discharge: 11/25/23 Discharge Facility: [facility]) Type of Discharge: Inpatient Admission Primary Inpatient Discharge Diagnosis:: Acute on chronic blood loss anemia: endoscopy revealed large colon mass: surgical partial lap colectomy How have you been since you were released from the hospital?: Better (I am doing fine, not having any problems.  My girlfriend is looking out for me.  I am eating and pooping and not in any real pain.  I am not near my medicine right now, so I can't review them with you.  I am having my car worked on right now) Any questions or concerns?: No  Items Reviewed: Did you receive and understand the discharge instructions provided?: Yes (thoroughly reviewed with patient who verbalizes good understanding of same) Medications obtained,verified, and reconciled?: No (Declined medication review; confirmed patient plans to obtain/ start taking all newly Rx'd medications as instructed- said to pick up at outpatient pharmacy today; self-manages medications and denies questions/ concerns around medications today) Medications Not Reviewed Reasons:: Other: (Patient declined- he is not currently near his medications) Any new allergies since your discharge?: No Dietary orders reviewed?: Yes Type of Diet Ordered:: Soft food after the surgery Do you have support at home?: Yes People in Home [RPT]: significant other Name of Support/Comfort Primary Source: Reports independent in self-care activities; resides with supportive girlfriend-- assists as/ if needed/ indicated  Medications Reviewed Today: Medications Reviewed Today     Reviewed by Ijeoma Loor,  Demetria Lightsey M, RN (Registered Nurse) on 11/26/23 at 1455  Med List Status: <None>   Medication Order Taking? Sig Documenting Provider Last Dose Status Informant  acetaminophen  (TYLENOL ) 325 MG tablet [id number]  Take 2 tablets (650 mg total) by mouth every 4 (four) hours as needed for headache or mild pain (pain score 1-3). Fausto Burnard LABOR, DO  Active   amLODipine  (NORVASC ) 5 MG tablet [id number] No Take 5 mg by mouth daily. [provider] 11/16/2023 Active Self  aspirin  EC 81 MG tablet [id number] No Take 1 tablet (81 mg total) by mouth daily. Joshua Cathryne BROCKS, MD 11/16/2023 Active Self  atorvastatin  (LIPITOR ) 80 MG tablet [id number] No TAKE 1 TABLET BY MOUTH ONCE  DAILY Jones, Deanna C, MD 11/16/2023 Active Self  Baclofen  5 MG TABS [id number] No Take 1 tablet (5 mg total) by mouth 3 (three) times daily as needed. Alvia Selinda PARAS, MD 11/16/2023 Active Self  ferrous sulfate  325 (65 FE) MG tablet [id number]  Take 1 tablet (325 mg total) by mouth every other day. Fausto Burnard A, DO  Active   isosorbide  mononitrate (IMDUR ) 30 MG 24 hr tablet [id number] No Take 30 mg by mouth daily. [provider] 11/16/2023 Active Self  metoprolol  tartrate (LOPRESSOR ) 25 MG tablet [id number] No Take 0.5 tablets (12.5 mg total) by mouth 2 (two) times daily.  Patient taking differently: Take 25 mg by mouth 2 (two) times daily.   Joshua Cathryne BROCKS, MD 11/16/2023 Active Self  nitroGLYCERIN  (NITROSTAT ) 0.4 MG SL tablet [id number] No Place 1 tablet (0.4 mg total) under the tongue every 5 (five) minutes as needed for chest pain. Joshua Cathryne BROCKS, MD Unknown Active Self  ondansetron  (ZOFRAN -ODT) 4 MG disintegrating tablet [id number]  Take 1 tablet (4 mg total) by mouth every 8 (  eight) hours as needed for nausea or vomiting. Fausto Sor A, DO  Active   oxyCODONE  (ROXICODONE ) 5 MG immediate release tablet [id number]  Take 1-2 tablets (5-10 mg total) by mouth every 6 (six) hours as needed for severe pain (pain score 7-10) or  moderate pain (pain score 4-6). Fausto Sor A, DO  Active   pantoprazole  (PROTONIX ) 40 MG tablet [id number] No TAKE 1 TABLET BY MOUTH DAILY Jones, Deanna C, MD 11/16/2023 Active Self  simethicone  (MYLICON) 80 MG chewable tablet [id number]  Chew 1 tablet (80 mg total) by mouth 4 (four) times daily for 15 days. Fausto Sor LABOR, DO  Active            Home Care and Equipment/Supplies: Were Home Health Services Ordered?: No Any new equipment or medical supplies ordered?: No  Functional Questionnaire: Do you need assistance with bathing/showering or dressing?: No Do you need assistance with meal preparation?: No (girlfriend assists) Do you need assistance with eating?: No Do you have difficulty maintaining continence: No Do you need assistance with getting out of bed/getting out of a chair/moving?: No Do you have difficulty managing or taking your medications?: No  Follow up appointments reviewed: PCP Follow-up appointment confirmed?: Yes Date of PCP follow-up appointment?: 11/30/23 Follow-up Provider: PCP Specialist Hospital Follow-up appointment confirmed?: No Reason Specialist Follow-Up Not Confirmed: Patient has Specialist Provider Number and will Call for Appointment (encouraged patient to call and schedule with surgeon promptly- he verbalizes agreement, states he will do soon and confirms has contact information at home for surgical provider) Do you need transportation to your follow-up appointment?: No Do you understand care options if your condition(s) worsen?: Yes-patient verbalized understanding  SDOH Interventions Today    Flowsheet Row Most Recent Value  SDOH Interventions   Food Insecurity Interventions Intervention Not Indicated  Housing Interventions Intervention Not Indicated  Transportation Interventions Intervention Not Indicated  [drives self at baseline]  Utilities Interventions Intervention Not Indicated   See TOC assessment tabs for additional assessment/  TOC intervention information  Successfully enrolled into 30-day TOC program  Pls call/ message for questions,  Zakara Parkey Mckinney Mcgregor Tinnon, RN, BSN, Media planner  Transitions of Care  [facility] - [facility] [phone]: direct office

## 2023-11-30 ENCOUNTER — Ambulatory Visit (INDEPENDENT_AMBULATORY_CARE_PROVIDER_SITE_OTHER): Admitting: Family Medicine

## 2023-11-30 ENCOUNTER — Encounter: Payer: Self-pay | Admitting: Family Medicine

## 2023-11-30 VITALS — BP 148/70 | HR 63 | Ht 66.0 in | Wt 176.0 lb

## 2023-11-30 DIAGNOSIS — Z09 Encounter for follow-up examination after completed treatment for conditions other than malignant neoplasm: Secondary | ICD-10-CM

## 2023-11-30 DIAGNOSIS — Z9049 Acquired absence of other specified parts of digestive tract: Secondary | ICD-10-CM

## 2023-11-30 DIAGNOSIS — I509 Heart failure, unspecified: Secondary | ICD-10-CM

## 2023-11-30 DIAGNOSIS — N1831 Chronic kidney disease, stage 3a: Secondary | ICD-10-CM

## 2023-11-30 DIAGNOSIS — D62 Acute posthemorrhagic anemia: Secondary | ICD-10-CM | POA: Diagnosis not present

## 2023-11-30 NOTE — Progress Notes (Signed)
 Established Patient Office Visit  Subjective   Patient ID: Matthew Humphrey, male    DOB: [birth year]  Age: 78 y.o. MRN: [id number]  Chief Complaint  Patient presents with   Hospitalization Follow-up     Assessment & Plan:   Problem List Items Addressed This Visit       Other   Acute on chronic blood loss anemia   Relevant Orders   CBC with Differential   Other Visit Diagnoses       Hospital discharge follow-up    -  Primary     Stage 3a chronic kidney disease (HCC)       Relevant Orders   Comprehensive Metabolic Panel (CMET)     H/O hemicolectomy         Acute on chronic heart failure, unspecified heart failure type (HCC)         Assessment and Plan Assessment & Plan Colon cancer, status post right hemicolectomy Recovery post-hemicolectomy is progressing well without complications. - Continue current medications including Tylenol  for pain management. - Advise nutritional, protein-based diet to aid recovery. - Ensure follow-up with oncology on September 3rd. - Sign up for MyChart to access medical information and appointments.  Iron  deficiency anemia secondary to colon cancer and surgery Iron  deficiency anemia improving with current hemoglobin at 8.1 g/dL. - Continue iron  tablets. - Repeat blood work to monitor hemoglobin levels.   - CHF - follow up with cardiology Return in about 5 weeks (around 01/04/2024) for chronic follow up with PCP.   78 y.o. M with HTN, HLD, dCHF, CAD s/p CABG 2011 seen in for a hospital discharge follow up,  he was found to have anemia, and was sent to hospital for blood transfusion but was found to have colon mass. He had right colectomy endoscopically, his colon biopsy showed  invasive tubular adenoma with high grade dysplasia.     Discussed the use of AI scribe software for clinical note transcription with the patient, who gave verbal consent to proceed.  History of Present Illness   He underwent robotic surgery to remove a  cancerous mass from the right colon. Postoperatively, he was hospitalized due to low hemoglobin levels from blood loss, recorded at 8.1 g/dL six days ago, and remains stable. He continues on iron  tablets to improve hemoglobin levels.  Since discharge, he experiences no abdominal pain, fever, or hematochezia. Bowel movements are normal, and energy levels are improving.  Current medications reviewed.  He is not using oxycodone , as Tylenol  manages his pain effectively.  He has upcoming appointments with his oncologist on September 3rd and cardiologist on September 30th.      Review of Systems  All other systems reviewed and are negative.     Objective:     BP (!) 148/70   Pulse 63   Ht 5' 6 (1.676 m)   Wt 176 lb (79.8 kg)   SpO2 97%   BMI 28.41 kg/m    Physical Exam Vitals and nursing note reviewed.  Constitutional:      Appearance: Normal appearance.  HENT:     Head: Normocephalic.     Right Ear: External ear normal.     Left Ear: External ear normal.  Eyes:     Conjunctiva/sclera: Conjunctivae normal.  Cardiovascular:     Rate and Rhythm: Normal rate.  Pulmonary:     Effort: Pulmonary effort is normal. No respiratory distress.  Abdominal:     Palpations: Abdomen is soft.  Musculoskeletal:  General: Normal range of motion.  Skin:    General: Skin is warm.  Neurological:     Mental Status: He is alert and oriented to person, place, and time.  Psychiatric:        Mood and Affect: Mood normal.      No results found for any visits on 11/30/23.    The 10-year ASCVD risk score (Arnett DK, et al., 2019) is: 28.5%      Vinary K Allysen Lazo, MD

## 2023-12-01 LAB — COMPREHENSIVE METABOLIC PANEL WITH GFR
ALT: 129 IU/L — ABNORMAL HIGH (ref 0–44)
AST: 106 IU/L — ABNORMAL HIGH (ref 0–40)
Albumin: 3.6 g/dL — ABNORMAL LOW (ref 3.8–4.8)
Alkaline Phosphatase: 115 IU/L (ref 44–121)
BUN/Creatinine Ratio: 15 (ref 10–24)
BUN: 17 mg/dL (ref 8–27)
Bilirubin Total: <0.2 mg/dL (ref 0.0–1.2)
CO2: 22 mmol/L (ref 20–29)
Calcium: 9.6 mg/dL (ref 8.6–10.2)
Chloride: 105 mmol/L (ref 96–106)
Creatinine, Ser: 1.16 mg/dL (ref 0.76–1.27)
Globulin, Total: 2.8 g/dL (ref 1.5–4.5)
Glucose: 94 mg/dL (ref 70–99)
Potassium: 4.6 mmol/L (ref 3.5–5.2)
Sodium: 141 mmol/L (ref 134–144)
Total Protein: 6.4 g/dL (ref 6.0–8.5)
eGFR: 65 mL/min/1.73 (ref >59)

## 2023-12-01 LAB — CBC WITH DIFFERENTIAL/PLATELET
Basophils Absolute: 0 x10E3/uL (ref 0.0–0.2)
Basos: 1 %
EOS (ABSOLUTE): 0.1 x10E3/uL (ref 0.0–0.4)
Eos: 2 %
Hematocrit: 36 % — ABNORMAL LOW (ref 37.5–51.0)
Hemoglobin: 10.1 g/dL — ABNORMAL LOW (ref 13.0–17.7)
Immature Grans (Abs): 0 x10E3/uL (ref 0.0–0.1)
Immature Granulocytes: 0 %
Lymphocytes Absolute: 1.5 x10E3/uL (ref 0.7–3.1)
Lymphs: 29 %
MCH: 22 pg — ABNORMAL LOW (ref 26.6–33.0)
MCHC: 28.1 g/dL — ABNORMAL LOW (ref 31.5–35.7)
MCV: 78 fL — ABNORMAL LOW (ref 79–97)
Monocytes Absolute: 0.4 x10E3/uL (ref 0.1–0.9)
Monocytes: 7 %
Neutrophils Absolute: 3.3 x10E3/uL (ref 1.4–7.0)
Neutrophils: 61 %
Platelets: 457 x10E3/uL — ABNORMAL HIGH (ref 150–450)
RBC: 4.59 x10E6/uL (ref 4.14–5.80)
RDW: 27.4 % — ABNORMAL HIGH (ref 11.6–15.4)
WBC: 5.3 x10E3/uL (ref 3.4–10.8)

## 2023-12-04 ENCOUNTER — Other Ambulatory Visit: Payer: Self-pay | Admitting: *Deleted

## 2023-12-04 ENCOUNTER — Ambulatory Visit: Payer: Self-pay | Admitting: Family Medicine

## 2023-12-04 NOTE — Transitions of Care (Post Inpatient/ED Visit) (Signed)
 Transition of Care week 2/ day # 8  Visit Note  12/04/2023  Name: Matthew Humphrey MRN: [id number]          DOB: [birth year]  Situation: Patient enrolled in [facility] 30-day program. Visit completed with patient by telephone.   HIPAA identifiers x 2 verified  Background:  Recent hospitalization August 15-2- 24, 2025-  for partial lap colectomy secondary to colon mass; acute on chronic blood loss anemia (1) unplanned hospitalization x last (6) and (12) months  Initial Transition Care Management Follow-up Telephone Call    Past Medical History:  Diagnosis Date   Arthritis    knee   Chronic kidney disease    GERD (gastroesophageal reflux disease)    Hyperlipidemia    Hypertension    Assessment:  I am doing fine, better than I was; Dr. Docia said the incisions look good and I am not in pain- they look fine to me, like they are healing.  Going to the cancer doctor tomorrow, to find out what they want me to do about this mass in my colon.  Eating good, pooping good- no signs of bleeding again, not yet anyway.  I need to pick up some more iron , and I will do that.  I am glad we reviewed medicines- I thought I was supposed to take a whole dose of this blood pressure medicine twice a day- but now I see it says half a dose.  I will start taking half a dose (metoprolol  12.5 mg BID) from here on out.  I am not in pain and doing fine    Denies clinical concerns and sounds to be in no distress throughout TOC 30-day program outreach call today  Patient Reported Symptoms: Cognitive Cognitive Status: Normal speech and language skills, Insightful and able to interpret abstract concepts, Alert and oriented to person, place, and time Cognitive/Intellectual Conditions Management [RPT]: None reported or documented in medical history or problem list      Neurological Neurological Review of Symptoms: No symptoms reported    HEENT HEENT Symptoms Reported: No symptoms reported      Cardiovascular  Cardiovascular Symptoms Reported: No symptoms reported, Other: Other Cardiovascular Symptoms: Clarified dosing of blood pressure medication metoprolol : patient has been taking 25 mg BID; instructions advise to take one-half (12.5 mg) BID: patient reports he will start taking half tablet as instructed; confirmed does not routinely monitor blood pressures at home Does patient have uncontrolled Hypertension?: No Cardiovascular Management Strategies: Routine screening, Medication therapy, Coping strategies, Adequate rest  Respiratory Respiratory Symptoms Reported: No symptoms reported Other Respiratory Symptoms: Denies shortness of breath/ cough; sounds to be in no respiratory distress throughout TOC call Respiratory Management Strategies: Routine screening, Coping strategies  Endocrine Endocrine Symptoms Reported: No symptoms reported Is patient diabetic?: No    Gastrointestinal Gastrointestinal Symptoms Reported: No symptoms reported Additional Gastrointestinal Details: Continues to report good appetite, eating good; reports pooping fine- normally went this mmorning; Denies recent signs/ symptoms GI bleeding:  Reinforced signs/ symptoms GI bleeding along with corresponding action plan; encouraged patient to promptly start taking iron  QD- to get new bottle if he is going to run out: provided education on purpose of iron ; strategies to prevent constipation- need to discuss any constipation that might occur with care providers: her verbalizes good understanding of same Gastrointestinal Management Strategies: Coping strategies, Medication therapy, Diet modification    Genitourinary      Integumentary Integumentary Symptoms Reported: Incision Additional Integumentary Details: Continues to report incisions look fine; Dr. Jewelene  was ahppy and said they are healing well.  I go to the cancer center tomorrow and I am going to have the doctor there look at it too.  To me, they look fine, no drainage, no  redness, just a little tiny bit of swelling Confirmed/ reinforced signs/ symptoms incisional infection, along with corresponding action plan: verbalizes good ongoing understanding/ adherence to same Skin Management Strategies: Routine screening, Adequate rest, Coping strategies  Musculoskeletal Musculoskelatal Symptoms Reviewed: Unsteady gait Additional Musculoskeletal Details: confirmed uses assistive devices on regular basis, at baseline -- cane prn Musculoskeletal Management Strategies: Routine screening, Coping strategies, Medical device Falls in the past year?: No Number of falls in past year: 1 or less Was there an injury with Fall?: No (N/A- no falls reported x > 12 months) Fall Risk Category Calculator: 0 Patient Fall Risk Level: Low Fall Risk Patient at Risk for Falls Due to: Impaired balance/gait, Medication side effect Fall risk Follow up: Falls prevention discussed, Education provided  Psychosocial Psychosocial Symptoms Reported: No symptoms reported   Techniques to Cope with Loss/Stress/Change: Diversional activities Quality of Family Relationships: supportive, involved, helpful   There were no vitals filed for this visit.  Medications Reviewed Today     Reviewed by Randale Carvalho M, RN (Registered Nurse) on 12/04/23 at 1416  Med List Status: <None>   Medication Order Taking? Sig Documenting Provider Last Dose Status Informant  acetaminophen  (TYLENOL ) 325 MG tablet [id number] Yes Take 2 tablets (650 mg total) by mouth every 4 (four) hours as needed for headache or mild pain (pain score 1-3). Fausto Sor A, DO  Active   amLODipine  (NORVASC ) 5 MG tablet [id number] Yes Take 5 mg by mouth daily. [provider]  Active Self  aspirin  EC 81 MG tablet [id number] Yes Take 1 tablet (81 mg total) by mouth daily. Joshua Cathryne BROCKS, MD  Active Self  atorvastatin  (LIPITOR ) 80 MG tablet [id number] Yes TAKE 1 TABLET BY MOUTH ONCE  DAILY Jones, Deanna C, MD  Active Self  Baclofen  5 MG  TABS [id number] Yes Take 1 tablet (5 mg total) by mouth 3 (three) times daily as needed. Matthews, Jason J, MD  Active Self  ferrous sulfate  325 (65 FE) MG tablet [id number] Yes Take 1 tablet (325 mg total) by mouth every other day. Fausto Sor A, DO  Active   isosorbide  mononitrate (IMDUR ) 30 MG 24 hr tablet [id number] Yes Take 30 mg by mouth daily. [provider]  Active Self  metoprolol  tartrate (LOPRESSOR ) 25 MG tablet [id number] Yes Take 0.5 tablets (12.5 mg total) by mouth 2 (two) times daily. Joshua Cathryne BROCKS, MD  Active Self  nitroGLYCERIN  (NITROSTAT ) 0.4 MG SL tablet [id number] Yes Place 1 tablet (0.4 mg total) under the tongue every 5 (five) minutes as needed for chest pain. Joshua Cathryne BROCKS, MD  Active Self  ondansetron  (ZOFRAN -ODT) 4 MG disintegrating tablet [id number]  Take 1 tablet (4 mg total) by mouth every 8 (eight) hours as needed for nausea or vomiting.  Patient not taking: Reported on 12/04/2023   Fausto Sor A, DO  Active   oxyCODONE  (ROXICODONE ) 5 MG immediate release tablet [id number]  Take 1-2 tablets (5-10 mg total) by mouth every 6 (six) hours as needed for severe pain (pain score 7-10) or moderate pain (pain score 4-6).  Patient not taking: Reported on 12/04/2023   Fausto Sor LABOR, DO  Active   pantoprazole  (PROTONIX ) 40 MG tablet [id number] Yes TAKE 1 TABLET BY MOUTH DAILY Jones, Deanna C, MD  Active  Self  simethicone  (MYLICON) 80 MG chewable tablet [id number]  Chew 1 tablet (80 mg total) by mouth 4 (four) times daily for 15 days.  Patient not taking: Reported on 12/04/2023   Fausto Burnard LABOR, DO  Active            Recommendation:   Specialty provider follow-up as scheduled- oncology provider 12/05/23  Follow Up Plan:   Telephone follow-up in 1 week- as scheduled 12/12/23  Pls call/ message for questions,  Ladaija Dimino Mckinney Trana Ressler, RN, BSN, CCRN Alumnus RN Care Manager  Transitions of Care  [facility] - [facility] [phone]: direct  office

## 2023-12-04 NOTE — Patient Instructions (Signed)
 Visit Information  Thank you for taking time to visit with me today. Please don't hesitate to contact me if I can be of assistance to you before our next scheduled telephone appointment.  Our next appointment is by telephone on Wednesday December 12, 2023 at 2:00 pm with nurse Davina, and then again on Wednesday December 19, 2023 with nurse Beatris  Please call the care guide team at [phone] if you need to cancel or reschedule your appointment.   Following are the goals we discussed today:  Patient Self Care Activities:  Attend all scheduled provider appointments Call provider office for new concerns or questions  Participate in Transition of Care Program/Attend TOC scheduled calls Take medications as prescribed   Continue pacing activity as your recuperation from recent surgery continues Use assistive devices as needed to prevent falls- your cane Please make sure you are taking all of your medications exactly the way you are supposed to If you believe your condition is getting worse- contact your care providers (doctors) promptly- reaching out to your doctor early when you have concerns can prevent you from having to go to the hospital  If you are experiencing a Mental Health or Behavioral Health Crisis or need someone to talk to, please  call the Suicide and Crisis Lifeline: 988 call the [place]  National Suicide Prevention Lifeline: 434-804-3219 or TTY: 641-078-6969 TTY (704) 366-4867) to talk to a trained counselor call 1-800-273-TALK (toll free, 24 hour hotline) go to [facility] [address] [place] [phone]) call the [facility] Line: [phone] call 911   The patient verbalized understanding of instructions, educational materials, and care plan provided today and DECLINED offer to receive copy of patient instructions, educational materials, and care plan.   Adedamola Seto Mckinney Wilmore Holsomback, RN, BSN, Financial risk analyst  Transitions of Care  [facility] - [facility] [phone]: direct office

## 2023-12-05 ENCOUNTER — Inpatient Hospital Stay

## 2023-12-05 ENCOUNTER — Encounter: Payer: Self-pay | Admitting: Internal Medicine

## 2023-12-05 ENCOUNTER — Inpatient Hospital Stay: Attending: Internal Medicine | Admitting: Internal Medicine

## 2023-12-05 VITALS — BP 113/66 | HR 72 | Temp 98.1°F | Resp 20 | Ht 66.0 in | Wt 175.0 lb

## 2023-12-05 DIAGNOSIS — I081 Rheumatic disorders of both mitral and tricuspid valves: Secondary | ICD-10-CM | POA: Insufficient documentation

## 2023-12-05 DIAGNOSIS — J189 Pneumonia, unspecified organism: Secondary | ICD-10-CM | POA: Insufficient documentation

## 2023-12-05 DIAGNOSIS — Z981 Arthrodesis status: Secondary | ICD-10-CM | POA: Insufficient documentation

## 2023-12-05 DIAGNOSIS — Z604 Social exclusion and rejection: Secondary | ICD-10-CM | POA: Diagnosis not present

## 2023-12-05 DIAGNOSIS — Z9049 Acquired absence of other specified parts of digestive tract: Secondary | ICD-10-CM | POA: Diagnosis not present

## 2023-12-05 DIAGNOSIS — C182 Malignant neoplasm of ascending colon: Secondary | ICD-10-CM | POA: Insufficient documentation

## 2023-12-05 DIAGNOSIS — J90 Pleural effusion, not elsewhere classified: Secondary | ICD-10-CM | POA: Insufficient documentation

## 2023-12-05 DIAGNOSIS — I1310 Hypertensive heart and chronic kidney disease without heart failure, with stage 1 through stage 4 chronic kidney disease, or unspecified chronic kidney disease: Secondary | ICD-10-CM | POA: Diagnosis not present

## 2023-12-05 DIAGNOSIS — Z79899 Other long term (current) drug therapy: Secondary | ICD-10-CM | POA: Diagnosis not present

## 2023-12-05 DIAGNOSIS — Z8249 Family history of ischemic heart disease and other diseases of the circulatory system: Secondary | ICD-10-CM | POA: Insufficient documentation

## 2023-12-05 DIAGNOSIS — Z882 Allergy status to sulfonamides status: Secondary | ICD-10-CM | POA: Insufficient documentation

## 2023-12-05 DIAGNOSIS — K922 Gastrointestinal hemorrhage, unspecified: Secondary | ICD-10-CM | POA: Diagnosis not present

## 2023-12-05 DIAGNOSIS — J432 Centrilobular emphysema: Secondary | ICD-10-CM | POA: Insufficient documentation

## 2023-12-05 DIAGNOSIS — I2510 Atherosclerotic heart disease of native coronary artery without angina pectoris: Secondary | ICD-10-CM | POA: Diagnosis not present

## 2023-12-05 DIAGNOSIS — J9811 Atelectasis: Secondary | ICD-10-CM | POA: Insufficient documentation

## 2023-12-05 DIAGNOSIS — N189 Chronic kidney disease, unspecified: Secondary | ICD-10-CM | POA: Diagnosis not present

## 2023-12-05 DIAGNOSIS — N200 Calculus of kidney: Secondary | ICD-10-CM | POA: Diagnosis not present

## 2023-12-05 DIAGNOSIS — Z881 Allergy status to other antibiotic agents status: Secondary | ICD-10-CM | POA: Diagnosis not present

## 2023-12-05 DIAGNOSIS — Z87891 Personal history of nicotine dependence: Secondary | ICD-10-CM | POA: Diagnosis not present

## 2023-12-05 DIAGNOSIS — Z7982 Long term (current) use of aspirin: Secondary | ICD-10-CM | POA: Diagnosis not present

## 2023-12-05 DIAGNOSIS — J438 Other emphysema: Secondary | ICD-10-CM | POA: Insufficient documentation

## 2023-12-05 DIAGNOSIS — I700 Atherosclerosis of aorta: Secondary | ICD-10-CM | POA: Diagnosis not present

## 2023-12-05 DIAGNOSIS — D509 Iron deficiency anemia, unspecified: Secondary | ICD-10-CM | POA: Insufficient documentation

## 2023-12-05 DIAGNOSIS — D45 Polycythemia vera: Secondary | ICD-10-CM | POA: Insufficient documentation

## 2023-12-05 DIAGNOSIS — M479 Spondylosis, unspecified: Secondary | ICD-10-CM | POA: Diagnosis not present

## 2023-12-05 LAB — IRON AND TIBC
Iron: 93 ug/dL (ref 45–182)
Saturation Ratios: 25 % (ref 17.9–39.5)
TIBC: 372 ug/dL (ref 250–450)
UIBC: 279 ug/dL

## 2023-12-05 LAB — CBC WITH DIFFERENTIAL (CANCER CENTER ONLY)
Abs Immature Granulocytes: 0.03 K/uL (ref 0.00–0.07)
Basophils Absolute: 0 K/uL (ref 0.0–0.1)
Basophils Relative: 1 %
Eosinophils Absolute: 0.1 K/uL (ref 0.0–0.5)
Eosinophils Relative: 1 %
HCT: 37.9 % — ABNORMAL LOW (ref 39.0–52.0)
Hemoglobin: 10.8 g/dL — ABNORMAL LOW (ref 13.0–17.0)
Immature Granulocytes: 1 %
Lymphocytes Relative: 32 %
Lymphs Abs: 1.8 K/uL (ref 0.7–4.0)
MCH: 22.7 pg — ABNORMAL LOW (ref 26.0–34.0)
MCHC: 28.5 g/dL — ABNORMAL LOW (ref 30.0–36.0)
MCV: 79.8 fL — ABNORMAL LOW (ref 80.0–100.0)
Monocytes Absolute: 0.4 K/uL (ref 0.1–1.0)
Monocytes Relative: 7 %
Neutro Abs: 3.4 K/uL (ref 1.7–7.7)
Neutrophils Relative %: 58 %
Platelet Count: 425 K/uL — ABNORMAL HIGH (ref 150–400)
RBC: 4.75 MIL/uL (ref 4.22–5.81)
RDW: 31.7 % — ABNORMAL HIGH (ref 11.5–15.5)
WBC Count: 5.7 K/uL (ref 4.0–10.5)
nRBC: 0 % (ref 0.0–0.2)

## 2023-12-05 LAB — CMP (CANCER CENTER ONLY)
ALT: 74 U/L — ABNORMAL HIGH (ref 0–44)
AST: 57 U/L — ABNORMAL HIGH (ref 15–41)
Albumin: 3.7 g/dL (ref 3.5–5.0)
Alkaline Phosphatase: 96 U/L (ref 38–126)
Anion gap: 9 (ref 5–15)
BUN: 31 mg/dL — ABNORMAL HIGH (ref 8–23)
CO2: 24 mmol/L (ref 22–32)
Calcium: 9.5 mg/dL (ref 8.9–10.3)
Chloride: 104 mmol/L (ref 98–111)
Creatinine: 1.26 mg/dL — ABNORMAL HIGH (ref 0.61–1.24)
GFR, Estimated: 59 mL/min — ABNORMAL LOW (ref >60)
Glucose, Bld: 111 mg/dL — ABNORMAL HIGH (ref 70–99)
Potassium: 4.4 mmol/L (ref 3.5–5.1)
Sodium: 137 mmol/L (ref 135–145)
Total Bilirubin: 0.4 mg/dL (ref 0.0–1.2)
Total Protein: 7.4 g/dL (ref 6.5–8.1)

## 2023-12-05 LAB — GENETIC SCREENING ORDER

## 2023-12-05 LAB — FERRITIN: Ferritin: 265 ng/mL (ref 24–336)

## 2023-12-05 NOTE — Assessment & Plan Note (Addendum)
# .   Colon, segmental resection for tumor, right :     - INVASIVE COLORECTAL ADENOCARCINOMA.  PpT3N0- stage IIA- FIFTEEN LYMPH NODES NEGATIVE FOR MALIGNANCY (0/15). Macroscopic Tumor Perforation: Not identified Lymphatic and/or Vascular Invasion: Present; Perineural Invasion: Not identified Tumor Budding Score: Low (0-4).  # stage II right-sided colon cancer-negative for any significant high risk features-except for lymphovascular invasion.  Recommend Signatera testing for further discussion regarding adjuvant chemotherapy.  I suspect clinically patient will not need adjuvant chemotherapy-unless Signatera testing is positive for any residual CT DNA.  # Iron  deficiency anemia-secondary to #1/chronic GI bleed-patient on oral iron .  Recommend checking iron  studies ferritin; possible Venofer  if significantly low.  # DISPOSITION: # labs- cbc/cmp; CEA; iron  studies;ferritin- signetera testing re: stage II colon cancer # follow up in appx 3-4 weeks- MD; No labs- possible venofer - Dr.B  # 40 minutes face-to-face with the patient discussing the above plan of care; more than 50% of time spent on prognosis/ natural history; counseling and coordination.

## 2023-12-05 NOTE — Progress Notes (Signed)
 No questions/concerns at this time.

## 2023-12-05 NOTE — Progress Notes (Signed)
 [facility] CONSULT NOTE  Patient Care Team: Kotturi, Vinay K, MD as PCP - General (Family Medicine) Rennie Cindy SAUNDERS, MD as Consulting Physician (Oncology) Maurie Rayfield BIRCH, RN as Oncology Nurse Navigator Tousey, Laine M, RN as VBCI Care Management  CHIEF COMPLAINTS/PURPOSE OF CONSULTATION: colon cancer  Oncology History Overview Note  FINAL DIAGNOSIS       1. Colon, segmental resection for tumor, right :      - INVASIVE COLORECTAL ADENOCARCINOMA.      - SEE CANCER SUMMARY BELOW.      - FIFTEEN LYMPH NODES NEGATIVE FOR MALIGNANCY (0/15).      - FIBROUS SEROSAL ADHESIONS.      - CHANGES CONSISTENT WITH PRIOR BIOPSY AND TATTOO INK.      - UNREMARKABLE PROXIMAL SMALL INTESTINE, DISTAL COLONIC, AND MESENTERIC      RESECTION MARGINS.      - UNREMARKABLE APPENDIX.      - INCIDENTAL MESENTERIC NODULE OF FOCALLY CALCIFIED ORGANIZING FAT NECROSIS.       DATE SIGNED OUT: 11/23/2023 ELECTRONIC SIGNATURE : [name], [name] , Pathologist, Electronic Signature  MICROSCOPIC DESCRIPTION 1. CASE SUMMARY: COLON AND RECTUM RESECTION Standard(s): AJCC 8  SPECIMEN Procedure: Right hemicolectomy  TUMOR Tumor Site: Ascending colon Histologic Type: Adenocarcinoma Histologic Grade: G2, moderately differentiated Tumor Size: Greatest dimension: 7.5 cm Tumor Extent: Invades through muscularis propria into the pericolic tissue Macroscopic Tumor Perforation: Not identified Lymphatic and/or Vascular Invasion: Present Small vessel Large vessel (venous), intramural Large vessel (venous), extramural Perineural Invasion: Not identified Tumor Budding Score: Low (0-4) Treatment Effect: No known presurgical therapy  MARGINS Margin Status for Invasive Carcinoma: All margins negative for invasive carcinoma Margin Status for Non-Invasive Tumor: All margins negative for high-grade dysplasia/intramucosal carcinoma and low-grade dysplasia  REGIONAL LYMPH NODES Regional Lymph  Node Status: All regional lymph nodes negative for tumor Number of Lymph Nodes Examined: 15 Tumor Deposits: Not identified  DISTANT METASTASES Distant Site(s) Involved, if applicable: Not applicable  PATHOLOGIC STAGE CLASSIFICATION (pTNM, AJCC 8th Edition):   Modified Classification: Not applicable pT3 T suffix: Not applicable pN0 pM: Not applicable    Cancer of right colon (HCC)  12/05/2023 Initial Diagnosis   Cancer of right colon (HCC)   12/05/2023 Cancer Staging   Staging form: Colon and Rectum, AJCC 8th Edition - Clinical: Stage IIA (cT3, cN0, cM0) - Signed by Rennie Cindy SAUNDERS, MD on 12/05/2023 Total positive nodes: 0     HISTORY OF PRESENTING ILLNESS: Patient ambulating- independently.   Alone/Accompanied by girlfriend Matthew Humphrey 78 y.o.  male pleasant patient with patient with a history of CAD on aspirin  s/p CABG 2012 CKD was recently admitted to hospital for worsening anemia.  Patient notes with right-sided colon cancer.  Status post right hemicolectomy.  Currently healing well.  Denies any nausea vomiting denies abdominal pain.  He continues to be on oral iron .  No constipation.  Review of Systems  Constitutional:  Negative for chills, diaphoresis, fever, malaise/fatigue and weight loss.  HENT:  Negative for nosebleeds and sore throat.   Eyes:  Negative for double vision.  Respiratory:  Negative for cough, hemoptysis, sputum production, shortness of breath and wheezing.   Cardiovascular:  Negative for chest pain, palpitations, orthopnea and leg swelling.  Gastrointestinal:  Negative for abdominal pain, blood in stool, constipation, diarrhea, heartburn, melena, nausea and vomiting.  Genitourinary:  Negative for dysuria, frequency and urgency.  Musculoskeletal:  Negative for back pain and joint pain.  Skin: Negative.  Negative  for itching and rash.  Neurological:  Negative for dizziness, tingling, focal weakness, weakness and headaches.   Endo/Heme/Allergies:  Does not bruise/bleed easily.  Psychiatric/Behavioral:  Negative for depression. The patient is not nervous/anxious and does not have insomnia.     MEDICAL HISTORY:  Past Medical History:  Diagnosis Date   Arthritis    knee   Chronic kidney disease    GERD (gastroesophageal reflux disease)    Hyperlipidemia    Hypertension     SURGICAL HISTORY: Past Surgical History:  Procedure Laterality Date   CARDIAC SURGERY  03/2010   bypass   CERVICAL FUSION  12/11/2012   C4-5,C5-6,C6-7   COLONOSCOPY  2011 ?   COLONOSCOPY  11/18/2023   Procedure: COLONOSCOPY;  Surgeon: Onita Elspeth Sharper, DO;  Location: [facility] ENDOSCOPY;  Service: Gastroenterology;;   COLONOSCOPY WITH PROPOFOL  N/A 07/11/2017   Procedure: COLONOSCOPY WITH PROPOFOL ;  Surgeon: Unk Corinn Skiff, MD;  Location[facility];  Service: Endoscopy;  Laterality: N/A;   ESOPHAGOGASTRODUODENOSCOPY N/A 11/18/2023   Procedure: EGD (ESOPHAGOGASTRODUODENOSCOPY);  Surgeon: Onita Elspeth Sharper, DO;  Location: [facility] END[facility]COPY;  Service: Gastroenterology;  Laterality: N/A;   POLYPECTOMY N/A 07/11/2017   Procedure: POLYPECTOMY;  Surgeon: Unk Corinn Skiff, MD;  Location: [facility];  Service: Endoscopy;  Laterality: N/A;    SOCIAL HISTORY: Social History   Socioeconomic History   Marital status: Single    Spouse name: Not on file   Number of children: 3   Years of education: Not on file   Highest education level: 9th grade  Occupational History   Occupation: Retired  Tobacco Use   Smoking status: Former    Current packs/day: 0.00    Average packs/day: 1.5 packs/day for 30.0 years (45.0 ttl pk-yrs)    Types: Cigarettes    Start date: 67    Quit date: 1997    Years since quitting: 28.6   Smokeless tobacco: Former    Types: Chew   Tobacco comments:    smoking cessation materials not required  Vaping Use   Vaping status: Never Used  Substance and Sexual Activity   Alcohol use: No     Alcohol/week: 0.0 standard drinks of alcohol   Drug use: No   Sexual activity: Not Currently  Other Topics Concern   Not on file  Social History Narrative   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, lives alone   Social Drivers of Corporate investment banker Strain: Low Risk  (02/01/2022)   Overall Financial Resource Strain (CARDIA)    Difficulty of Paying Living Expenses: Not hard at all  Food Insecurity: No Food Insecurity (12/05/2023)   Hunger Vital Sign    Worried About Running Out of Food in the Last Year: Never true    Ran Out of Food in the Last Year: Never true  Transportation Needs: No Transportation Needs (12/05/2023)   PRAPARE - Administrator, Civil Service (Medical): No    Lack of Transportation (Non-Medical): No  Physical Activity: Inactive (06/21/2020)   Exercise Vital Sign    Days of Exercise per Week: 0 days    Minutes of Exercise per Session: 0 min  Stress: No Stress Concern Present (02/01/2022)   [employer] of Occupational Health - Occupational Stress Questionnaire    Feeling of Stress : Not at all  Social Connections: Socially Isolated (11/17/2023)   Social Connection and Isolation Panel    Frequency of Communication with Friends and Family: Three times a week  Frequency of Social Gatherings with Friends and Family: Once a week    Attends Religious Services: Never    Database administrator or Organizations: No    Attends Banker Meetings: Never    Marital Status: Divorced  Catering manager Violence: Not At Risk (12/05/2023)   Humiliation, Afraid, Rape, and Kick questionnaire    Fear of Current or Ex-Partner: No    Emotionally Abused: No    Physically Abused: No    Sexually Abused: No    FAMILY HISTORY: Family History  Problem Relation Age of Onset   Hypertension Father    Hypotension Mother     ALLERGIES:  is allergic to sulfa antibiotics, sulfamethoxazole-trimethoprim, and sulfasalazine.  MEDICATIONS:  Current  Outpatient Medications  Medication Sig Dispense Refill   acetaminophen  (TYLENOL ) 325 MG tablet Take 2 tablets (650 mg total) by mouth every 4 (four) hours as needed for headache or mild pain (pain score 1-3).     amLODipine  (NORVASC ) 5 MG tablet Take 5 mg by mouth daily.     aspirin  EC 81 MG tablet Take 1 tablet (81 mg total) by mouth daily. 90 tablet 3   atorvastatin  (LIPITOR ) 80 MG tablet TAKE 1 TABLET BY MOUTH ONCE  DAILY 90 tablet 3   Baclofen  5 MG TABS Take 1 tablet (5 mg total) by mouth 3 (three) times daily as needed. 30 tablet 0   ferrous sulfate  325 (65 FE) MG tablet Take 1 tablet (325 mg total) by mouth every other day. 30 tablet 0   isosorbide  mononitrate (IMDUR ) 30 MG 24 hr tablet Take 30 mg by mouth daily.     metoprolol  tartrate (LOPRESSOR ) 25 MG tablet Take 0.5 tablets (12.5 mg total) by mouth 2 (two) times daily. 90 tablet 1   nitroGLYCERIN  (NITROSTAT ) 0.4 MG SL tablet Place 1 tablet (0.4 mg total) under the tongue every 5 (five) minutes as needed for chest pain. 50 tablet 3   pantoprazole  (PROTONIX ) 40 MG tablet TAKE 1 TABLET BY MOUTH DAILY 90 tablet 3   ondansetron  (ZOFRAN -ODT) 4 MG disintegrating tablet Take 1 tablet (4 mg total) by mouth every 8 (eight) hours as needed for nausea or vomiting. (Patient not taking: Reported on 12/04/2023) 20 tablet 0   oxyCODONE  (ROXICODONE ) 5 MG immediate release tablet Take 1-2 tablets (5-10 mg total) by mouth every 6 (six) hours as needed for severe pain (pain score 7-10) or moderate pain (pain score 4-6). (Patient not taking: Reported on 12/04/2023) 30 tablet 0   simethicone  (MYLICON) 80 MG chewable tablet Chew 1 tablet (80 mg total) by mouth 4 (four) times daily for 15 days. (Patient not taking: Reported on 12/04/2023) 60 tablet 0   No current facility-administered medications for this visit.    PHYSICAL EXAMINATION:   Vitals:   12/05/23 1346  BP: 113/66  Pulse: 72  Resp: 20  Temp: 98.1 F (36.7 C)  SpO2: 99%   Filed Weights    12/05/23 1346  Weight: 175 lb (79.4 kg)    Physical Exam Vitals and nursing note reviewed.  HENT:     Head: Normocephalic and atraumatic.     Mouth/Throat:     Pharynx: Oropharynx is clear.  Eyes:     Extraocular Movements: Extraocular movements intact.     Pupils: Pupils are equal, round, and reactive to light.  Cardiovascular:     Rate and Rhythm: Normal rate and regular rhythm.  Pulmonary:     Comments: Decreased breath sounds bilaterally.  Abdominal:  Palpations: Abdomen is soft.  Musculoskeletal:        General: Normal range of motion.     Cervical back: Normal range of motion.  Skin:    General: Skin is warm.  Neurological:     General: No focal deficit present.     Mental Status: He is alert and oriented to person, place, and time.  Psychiatric:        Behavior: Behavior normal.        Judgment: Judgment normal.     LABORATORY DATA:  I have reviewed the data as listed Lab Results  Component Value Date   WBC 5.7 12/05/2023   HGB 10.8 (L) 12/05/2023   HCT 37.9 (L) 12/05/2023   MCV 79.8 (L) 12/05/2023   PLT 425 (H) 12/05/2023   Recent Labs    11/16/23 0923 11/16/23 1338 11/24/23 0512 11/25/23 0552 11/30/23 0924 12/05/23 1450  NA 140   < > 138 139 141 137  K 4.0   < > 3.8 4.1 4.6 4.4  CL 107   < > 104 107 105 104  CO2 25   < > 26 29 22 24   GLUCOSE 80   < > 96 99 94 111*  BUN 23   < > 8 6* 17 31*  CREATININE 1.31*   < > 1.22 1.25* 1.16 1.26*  CALCIUM  9.2   < > 9.0 9.0 9.6 9.5  GFRNONAA 56*   < > >60 59*  --  59*  PROT 6.6  --   --   --  6.4 7.4  ALBUMIN 3.1*  --   --   --  3.6* 3.7  AST 41  --   --   --  106* 57*  ALT 43  --   --   --  129* 74*  ALKPHOS 76  --   --   --  115 96  BILITOT 0.4  --   --   --  <0.2 0.4   < > = values in this interval not displayed.    RADIOGRAPHIC STUDIES: I have personally reviewed the radiological images as listed and agreed with the findings in the report. US  EKG SITE RITE Result Date: 11/22/2023 If [facility]  image not attached, placement could not be confirmed due to current cardiac rhythm.  US  EKG SITE RITE Result Date: 11/22/2023 If [facility] image not attached, placement could not be confirmed due to current cardiac rhythm.  CT CHEST ABDOMEN PELVIS W CONTRAST Result Date: 11/19/2023 CLINICAL DATA:  Likely malignant colon mass.  * Tracking Code: BO * EXAM: CT CHEST, ABDOMEN, AND PELVIS WITH CONTRAST TECHNIQUE: Multidetector CT imaging of the chest, abdomen and pelvis was performed following the standard protocol during bolus administration of intravenous contrast. RADIATION DOSE REDUCTION: This exam was performed according to the departmental dose-optimization program which includes automated exposure control, adjustment of the mA and/or kV according to patient size and/or use of iterative reconstruction technique. CONTRAST:  OMNIPAQUE  IOHEXOL  300 MG/ML  SOLN COMPARISON:  CT chest 01/03/2013 in CT abdomen pelvis 01/19/2010. FINDINGS: CT CHEST FINDINGS Cardiovascular: Atherosclerotic calcification of the aorta, aortic valve and coronary arteries. Heart is at the upper limits of normal in size to mildly enlarged. No pericardial effusion. Mediastinum/Nodes: Mediastinal lymph nodes measure up to 1.3 cm in the AP window. Right hilar lymph node measures 12 mm. No axillary adenopathy. Esophagus is grossly unremarkable. Lungs/Pleura: Centrilobular and paraseptal emphysema. Chronic bibasilar volume loss. New peribronchovascular nodularity, ground-glass and consolidation in  the left upper and left lower lobes. Calcified granulomas. No pleural fluid. Airway is unremarkable. Musculoskeletal: Degenerative changes in the spine. No worrisome lytic or sclerotic lesions. CT ABDOMEN PELVIS FINDINGS Hepatobiliary: Liver and gallbladder are unremarkable. No biliary ductal dilatation. Pancreas: Negative. Spleen: Negative. Adrenals/Urinary Tract: Adrenal glands are unremarkable. Small low-attenuation lesions in the kidneys, too  small to characterize. No specific follow-up necessary. Small left renal stone. Ureters are decompressed. Bladder is grossly unremarkable. Stomach/Bowel: Stomach and small bowel unremarkable. Appendix is not readily visualized. Irregular apple-core mass in the cecum and ascending colon measures approximately 8.2 cm in length (coronal image 44). Several small ileocolic mesenteric lymph nodes measure up to 8 mm. Colon is otherwise unremarkable. Vascular/Lymphatic: Atherosclerotic calcification of the aorta. No additional pathologically enlarged lymph nodes. Reproductive: Prostate is visualized. Other: No free fluid. Mesenteries and peritoneum are otherwise unremarkable. Musculoskeletal: Well-circumscribed lucent lesions in the iliac wings are unchanged from 01/19/2010 and considered benign. No worrisome lytic or sclerotic lesions. IMPRESSION: 1. Cecal/ascending colon mass with small metastatic ileocolic mesenteric lymph nodes, compatible with primary colon carcinoma. No evidence of distant metastatic disease. 2. Left upper and left lower lobe pneumonia. Small to borderline enlarged mediastinal lymph nodes are likely reactive in etiology. 3. Small left renal stone. 4. Aortic atherosclerosis (ICD10-I70.0). Coronary artery calcification. 5. Emphysema (ICD10-J43.9). Electronically Signed   By: Newell Eke M.D.   On: 11/19/2023 14:16   ECHOCARDIOGRAM COMPLETE Result Date: 11/17/2023    ECHOCARDIOGRAM REPORT   Patient Name:   Matthew Humphrey Date of Exam: 11/17/2023 Medical Rec #:  [id number]          Height:       66.0 in Accession #:    [id number]         Weight:       180.0 lb Date of Birth:  [birth year]           BSA:          1.912 m Patient Age:    78 years           BP:           129/64 mmHg Patient Gender: M                  HR:           57 bpm. Exam Location:  [facility] Procedure: 2D Echo, Cardiac Doppler and Color Doppler (Both Spectral and Color            Flow Doppler were utilized during procedure).  Indications:     Dyspnea R06.00  History:         Patient has prior history of Echocardiogram examinations. Risk                  Factors:Hypertension.  Sonographer:     Bari Roar Referring Phys:  [id number] CARALYN HUDSON Diagnosing Phys: Cara JONETTA Lovelace MD IMPRESSIONS  1. Inferior posterior septal hypo.  2. Left ventricular ejection fraction, by estimation, is 45 to 50%. The left ventricle has mildly decreased function. The left ventricle demonstrates regional wall motion abnormalities (see scoring diagram/findings for description). Left ventricular diastolic parameters are consistent with Grade I diastolic dysfunction (impaired relaxation).  3. Right ventricular systolic function is normal. The right ventricular size is normal.  4. The mitral valve is normal in structure. Mild mitral valve regurgitation.  5. The aortic valve is normal in structure. Aortic valve regurgitation is not visualized. FINDINGS  Left Ventricle: Left ventricular ejection fraction,  by estimation, is 45 to 50%. The left ventricle has mildly decreased function. The left ventricle demonstrates regional wall motion abnormalities. Strain was performed and the global longitudinal strain is indeterminate. The left ventricular internal cavity size was normal in size. There is borderline concentric left ventricular hypertrophy. Left ventricular diastolic parameters are consistent with Grade I diastolic dysfunction (impaired relaxation). Right Ventricle: The right ventricular size is normal. No increase in right ventricular wall thickness. Right ventricular systolic function is normal. Left Atrium: Left atrial size was normal in size. Right Atrium: Right atrial size was normal in size. Pericardium: There is no evidence of pericardial effusion. Mitral Valve: The mitral valve is normal in structure. Mild mitral valve regurgitation. MV peak gradient, 4.2 mmHg. The mean mitral valve gradient is 1.0 mmHg. Tricuspid Valve: The tricuspid valve is normal  in structure. Tricuspid valve regurgitation is trivial. Aortic Valve: The aortic valve is normal in structure. Aortic valve regurgitation is not visualized. Aortic valve mean gradient measures 2.0 mmHg. Aortic valve peak gradient measures 4.8 mmHg. Aortic valve area, by VTI measures 2.72 cm. Pulmonic Valve: The pulmonic valve was normal in structure. Pulmonic valve regurgitation is not visualized. Aorta: The ascending aorta was not well visualized. IAS/Shunts: No atrial level shunt detected by color flow Doppler. Additional Comments: Inferior posterior septal hypo. 3D was performed not requiring image post processing on an independent workstation and was indeterminate.  LEFT VENTRICLE PLAX 2D LVIDd:         4.30 cm     Diastology LVIDs:         3.20 cm     LV e' medial:    5.11 cm/s LV PW:         1.00 cm     LV E/e' medial:  16.9 LV IVS:        1.30 cm     LV e' lateral:   10.60 cm/s LVOT diam:     1.80 cm     LV E/e' lateral: 8.1 LV SV:         51 LV SV Index:   27 LVOT Area:     2.54 cm  LV Volumes (MOD) LV vol d, MOD A2C: 78.1 ml LV vol d, MOD A4C: 66.1 ml LV vol s, MOD A2C: 42.0 ml LV vol s, MOD A4C: 34.7 ml LV SV MOD A2C:     36.1 ml LV SV MOD A4C:     66.1 ml LV SV MOD BP:      34.4 ml RIGHT VENTRICLE RV Basal diam:  2.80 cm RV Mid diam:    2.30 cm RV S prime:     8.38 cm/s TAPSE (M-mode): 1.4 cm LEFT ATRIUM             Index        RIGHT ATRIUM           Index LA diam:        3.90 cm 2.04 cm/m   RA Area:     12.20 cm LA Vol (A2C):   71.0 ml 37.13 ml/m  RA Volume:   25.40 ml  13.28 ml/m LA Vol (A4C):   51.6 ml 26.98 ml/m LA Biplane Vol: 61.9 ml 32.37 ml/m  AORTIC VALVE                    PULMONIC VALVE AV Area (Vmax):    2.17 cm     PV Vmax:        0.87 m/s  AV Area (Vmean):   2.28 cm     PV Peak grad:   3.0 mmHg AV Area (VTI):     2.72 cm     RVOT Peak grad: 2 mmHg AV Vmax:           109.00 cm/s AV Vmean:          69.300 cm/s AV VTI:            0.188 m AV Peak Grad:      4.8 mmHg AV Mean Grad:       2.0 mmHg LVOT Vmax:         92.80 cm/s LVOT Vmean:        62.100 cm/s LVOT VTI:          0.201 m LVOT/AV VTI ratio: 1.07  AORTA Ao Root diam: 2.40 cm Ao Asc diam:  3.00 cm MITRAL VALVE                TRICUSPID VALVE MV Area (PHT): 4.77 cm     TR Peak grad:   19.0 mmHg MV Area VTI:   1.84 cm     TR Vmax:        218.00 cm/s MV Peak grad:  4.2 mmHg MV Mean grad:  1.0 mmHg     SHUNTS MV Vmax:       1.02 m/s     Systemic VTI:  0.20 m MV Vmean:      51.1 cm/s    Systemic Diam: 1.80 cm MV Decel Time: 159 msec MV E velocity: 86.20 cm/s MV A velocity: 100.00 cm/s MV E/A ratio:  0.86 MV A Prime:    12.4 cm/s Cara JONETTA Lovelace MD Electronically signed by Cara JONETTA Lovelace MD Signature Date/Time: 11/17/2023/1:15:33 PM    Final    DG Chest 1 View Result Date: 11/16/2023 CLINICAL DATA:  Shortness of breath EXAM: CHEST  1 VIEW COMPARISON:  11/16/2023 FINDINGS: Prior CABG. Heart and mediastinal contours within normal limits. Left lung clear. Right basilar opacity is similar to prior study and could reflect atelectasis or infiltrate. Small right pleural effusions suspected. No acute bony abnormality. IMPRESSION: Right basilar airspace opacity likely reflects atelectasis or infiltrate/pneumonia. Small right pleural effusion. Electronically Signed   By: Franky Crease M.D.   On: 11/16/2023 19:25   DG Chest 2 View Result Date: 11/16/2023 CLINICAL DATA:  Shortness of breath. EXAM: CHEST - 2 VIEW COMPARISON:  01/03/2013. FINDINGS: The heart size and mediastinal contours are within normal limits. Prior median sternotomy and CABG. Streaky bibasilar scarring/subsegmental atelectasis. Suspected small right pleural effusion. No focal consolidation or pneumothorax. Cervical fusion hardware. No acute osseous abnormality. IMPRESSION: 1. Suspected small right pleural effusion. 2.  Streaky bibasilar scarring/subsegmental atelectasis. Electronically Signed   By: Harrietta Sherry M.D.   On: 11/16/2023 10:09     Cancer of right colon  (HCC) # . Colon, segmental resection for tumor, right :     - INVASIVE COLORECTAL ADENOCARCINOMA.  PpT3N0- stage IIA- FIFTEEN LYMPH NODES NEGATIVE FOR MALIGNANCY (0/15). Macroscopic Tumor Perforation: Not identified Lymphatic and/or Vascular Invasion: Present; Perineural Invasion: Not identified Tumor Budding Score: Low (0-4).  # stage II right-sided colon cancer-negative for any significant high risk features-except for lymphovascular invasion.  Recommend Signatera testing for further discussion regarding adjuvant chemotherapy.  I suspect clinically patient will not need adjuvant chemotherapy-unless Signatera testing is positive for any residual CT DNA.  # Iron  deficiency anemia-secondary to #1/chronic GI bleed-patient on oral iron .  Recommend checking  iron  studies ferritin; possible Venofer  if significantly low.  # DISPOSITION: # labs- cbc/cmp; CEA; iron  studies;ferritin- signetera testing re: stage II colon cancer # follow up in appx 3-4 weeks- MD; No labs- possible venofer - Dr.B  # 40 minutes face-to-face with the patient discussing the above plan of care; more than 50% of time spent on prognosis/ natural history; counseling and coordination.    Above plan of care was discussed with patient/family in detail.  My contact information was given to the patient/family.       Cindy JONELLE Joe, MD 12/05/2023 3:48 PM

## 2023-12-06 LAB — CEA: CEA: 6.6 ng/mL — ABNORMAL HIGH (ref 0.0–4.7)

## 2023-12-12 ENCOUNTER — Other Ambulatory Visit: Payer: Self-pay

## 2023-12-12 NOTE — Transitions of Care (Post Inpatient/ED Visit) (Signed)
 Transition of Care week 4  Visit Note  12/12/2023  Name: Matthew Humphrey MRN: [id number]          DOB: 15-Feb-1946  Situation: Patient enrolled in [facility] 30-day program. Visit completed with patient by telephone.   Background:  Recent hospitalization August 15-2- 24, 2025-  for partial lap colectomy secondary to colon mass; acute on chronic blood loss anemia (1) unplanned hospitalization x last (6) and (12) months  Initial Transition Care Management Follow-up Telephone Call    Past Medical History:  Diagnosis Date   Arthritis    knee   Chronic kidney disease    GERD (gastroesophageal reflux disease)    Hyperlipidemia    Hypertension     Assessment: Patient Reported Symptoms: Cognitive Cognitive Status: Normal speech and language skills, Insightful and able to interpret abstract concepts, Alert and oriented to person, place, and time      Neurological Neurological Review of Symptoms: No symptoms reported    HEENT HEENT Symptoms Reported: No symptoms reported      Cardiovascular Cardiovascular Symptoms Reported: No symptoms reported    Respiratory Respiratory Symptoms Reported: No symptoms reported    Endocrine Endocrine Symptoms Reported: No symptoms reported Is patient diabetic?: No    Gastrointestinal Gastrointestinal Symptoms Reported: Other Additional Gastrointestinal Details: patient reports he is doing well.  He states he is eating well and passing stool without problems.  He denies any further signs of GI bleeding. Patient reports he is taking his iron  medication as prescribed. Denies constipation Gastrointestinal Management Strategies: Medication therapy, Diet modification, Adequate rest (routine follow up with provider.)    Genitourinary Genitourinary Symptoms Reported: No symptoms reported    Integumentary Additional Integumentary Details: Patient reports his incision is healing well.  Denies any pain.  He reports having follow up visit iwth oncologist on  12/05/23. He states he is waiting to hear back regarding start of chemotherapy. Skin Management Strategies: Adequate rest, Routine screening  Musculoskeletal Musculoskelatal Symptoms Reviewed: Unsteady gait Additional Musculoskeletal Details: reports ongoing use of assistive device for ambulation. Denies any recent falls. Musculoskeletal Management Strategies: Routine screening, Medical device      Psychosocial Psychosocial Symptoms Reported: No symptoms reported         There were no vitals filed for this visit.  Medications Reviewed Today     Reviewed by Kandas Oliveto E, RN (Registered Nurse) on 12/12/23 at 1421  Med List Status: <None>   Medication Order Taking? Sig Documenting Provider Last Dose Status Informant  acetaminophen  (TYLENOL ) 325 MG tablet [id number] Yes Take 2 tablets (650 mg total) by mouth every 4 (four) hours as needed for headache or mild pain (pain score 1-3). Fausto Sor A, DO  Active   amLODipine  (NORVASC ) 5 MG tablet [id number] Yes Take 5 mg by mouth daily. [provider]  Active Self  aspirin  EC 81 MG tablet [id number] Yes Take 1 tablet (81 mg total) by mouth daily. Joshua Cathryne BROCKS, MD  Active Self  atorvastatin  (LIPITOR ) 80 MG tablet [id number] Yes TAKE 1 TABLET BY MOUTH ONCE  DAILY Jones, Deanna C, MD  Active Self  Baclofen  5 MG TABS [id number] Yes Take 1 tablet (5 mg total) by mouth 3 (three) times daily as needed. Matthews, Jason J, MD  Active Self  ferrous sulfate  325 (65 FE) MG tablet [id number] Yes Take 1 tablet (325 mg total) by mouth every other day. Fausto Sor A, DO  Active   isosorbide  mononitrate (IMDUR ) 30 MG 24 hr tablet [id number] Yes Take 30  mg by mouth daily. [provider]  Active Self  metoprolol  tartrate (LOPRESSOR ) 25 MG tablet [id number] Yes Take 0.5 tablets (12.5 mg total) by mouth 2 (two) times daily. Joshua Cathryne BROCKS, MD  Active Self  nitroGLYCERIN  (NITROSTAT ) 0.4 MG SL tablet [id number] Yes Place 1 tablet (0.4 mg total)  under the tongue every 5 (five) minutes as needed for chest pain. Joshua Cathryne BROCKS, MD  Active Self  ondansetron  (ZOFRAN -ODT) 4 MG disintegrating tablet [id number]  Take 1 tablet (4 mg total) by mouth every 8 (eight) hours as needed for nausea or vomiting.  Patient not taking: Reported on 12/12/2023   Fausto Sor A, DO  Active   oxyCODONE  (ROXICODONE ) 5 MG immediate release tablet [id number]  Take 1-2 tablets (5-10 mg total) by mouth every 6 (six) hours as needed for severe pain (pain score 7-10) or moderate pain (pain score 4-6).  Patient not taking: Reported on 12/12/2023   Fausto Sor A, DO  Active   pantoprazole  (PROTONIX ) 40 MG tablet [id number] Yes TAKE 1 TABLET BY MOUTH DAILY Jones, Deanna C, MD  Active Self  simethicone  (MYLICON) 80 MG chewable tablet [id number] Yes Chew 1 tablet (80 mg total) by mouth 4 (four) times daily for 15 days. Fausto Sor LABOR, DO  Active             Goals Addressed             This Visit's Progress    VBCI Transitions of Care (TOC) Care Plan       Problems:  Recent Hospitalization for treatment of surgery for colon mass No Specialist appointment 11/26/23:  encouraged patient to schedule promptly with surgical provider: he confirms he has contact information for surgical provider and agrees to call soon Recent hospitalization August 15-2- 24, 2025-  for partial lap colectomy secondary to colon mass; acute on chronic blood loss anemia (1) unplanned hospitalization x last (6) and (12) months  Goal:  Over the next 30 days, the patient will not experience hospital readmission  Interventions:  Transitions of Care:  week # 2/ day # 8 Doctor Visits  - discussed the importance of doctor visits Post discharge activity limitations prescribed by provider reviewed Post-op wound/incision care reviewed with patient/caregiver Reviewed Signs and symptoms of infection Assessed for new or ongoing GI symptoms. Advised to contact provider for any worsening  symptoms. Advised to seek emergency medical services for severe symptoms such as SOB, chest pain, stroke like symptoms and/ or severe bleeding. Patient verbalized understanding.  Assessed current clinical condition: patient states he is doing just fine.  He denies any ongoing or new issues.  He reports having his follow up visit with the oncologist on 12/05/23 and states he is waiting for feedback on whether to have chemotherapy.  Discussed patient's most recent Hgb level of 10.8. Patient states he continues to take his iron  medication daily and his metoprolol  1/2 tab (12.5 mg) BID as prescribed.  Patient reports his incision has healed well. Denies any signs of infection. Patient states he continues to have a good appetite and is passing stool without issues. He states his energy level is much better than it was previously. Confirmed continues using assistive devices on regular basis, at baseline -- cane- prn;  Assessed for falls. Provided education/ reinforcement around benefit of conservative post-op activity; need to pace activity without over-doing- benefits of ambulating around home several times per day Medications reviewed and compliance with medications reinforced.  Reviewed upcoming provider office visits  Surgery (partial lap colectomy secondary to colon mass): Evaluation of current treatment plan related to abdominal- colectomy surgery reviewed post-operative instructions with patient/caregiver addressed questions about post - surgical incision care   Patient Self Care Activities:  Attend all scheduled provider appointments Call provider office for new concerns or questions  Participate in Transition of Care Program/Attend TOC scheduled calls Take medications as prescribed   Continue pacing activity as your recuperation from recent surgery continues Use assistive devices as needed to prevent falls- your cane Please make sure you are taking all of your medications exactly the way you  are supposed to If you believe your condition is getting worse- contact your care providers (doctors) promptly- reaching out to your doctor early when you have concerns can prevent you from having to go to the hospital seek emergency medical services for severe symptoms such as SOB, chest pain, stroke like symptoms and/ or severe bleeding  Plan:  Telephone follow up appointment with care management team member scheduled for:  Wednesday 12/19/23 at 2:00 pm with Laine Tousey, RN case manager.          Recommendation:   Continue Current Plan of Care  Follow Up Plan:   Telephone follow-up in 1 week 12/19/23 at 2 pm with Laine Tousey RN case manager  Arvin Seip RN, BSN, CCM [place]  [facility], Population Health Case Manager Phone: [phone]

## 2023-12-12 NOTE — Patient Instructions (Signed)
 Visit Information  Thank you for taking time to visit with me today. Please don't hesitate to contact me if I can be of assistance to you before our next scheduled telephone appointment.  Our next appointment is by telephone on 12/19/23 at 2 pm  Following is a copy of your care plan:   Goals Addressed             This Visit's Progress    VBCI Transitions of Care (TOC) Care Plan       Problems:  Recent Hospitalization for treatment of surgery for colon mass No Specialist appointment 11/26/23:  encouraged patient to schedule promptly with surgical provider: he confirms he has contact information for surgical provider and agrees to call soon Recent hospitalization August 15-2- 24, 2025-  for partial lap colectomy secondary to colon mass; acute on chronic blood loss anemia (1) unplanned hospitalization x last (6) and (12) months  Goal:  Over the next 30 days, the patient will not experience hospital readmission  Interventions:  Transitions of Care:  week # 2/ day # 8 Doctor Visits  - discussed the importance of doctor visits Post discharge activity limitations prescribed by provider reviewed Post-op wound/incision care reviewed with patient/caregiver Reviewed Signs and symptoms of infection Assessed for new or ongoing GI symptoms. Advised to contact provider for any worsening symptoms. Advised to seek emergency medical services for severe symptoms such as SOB, chest pain, stroke like symptoms and/ or severe bleeding. Patient verbalized understanding.  Assessed current clinical condition: patient states he is doing just fine.  He denies any ongoing or new issues.  He reports having his follow up visit with the oncologist on 12/05/23 and states he is waiting for feedback on whether to have chemotherapy.  Discussed patient's most recent Hgb level of 10.8. Patient states he continues to take his iron  medication daily and his metoprolol  1/2 tab (12.5 mg) BID as prescribed.  Patient reports his  incision has healed well. Denies any signs of infection. Patient states he continues to have a good appetite and is passing stool without issues. He states his energy level is much better than it was previously. Confirmed continues using assistive devices on regular basis, at baseline -- cane- prn;  Assessed for falls. Provided education/ reinforcement around benefit of conservative post-op activity; need to pace activity without over-doing- benefits of ambulating around home several times per day Medications reviewed and compliance with medications reinforced.  Reviewed upcoming provider office visits    Surgery (partial lap colectomy secondary to colon mass): Evaluation of current treatment plan related to abdominal- colectomy surgery reviewed post-operative instructions with patient/caregiver addressed questions about post - surgical incision care   Patient Self Care Activities:  Attend all scheduled provider appointments Call provider office for new concerns or questions  Participate in Transition of Care Program/Attend TOC scheduled calls Take medications as prescribed   Continue pacing activity as your recuperation from recent surgery continues Use assistive devices as needed to prevent falls- your cane Please make sure you are taking all of your medications exactly the way you are supposed to If you believe your condition is getting worse- contact your care providers (doctors) promptly- reaching out to your doctor early when you have concerns can prevent you from having to go to the hospital seek emergency medical services for severe symptoms such as SOB, chest pain, stroke like symptoms and/ or severe bleeding  Plan:  Telephone follow up appointment with care management team member scheduled for:  Wednesday 12/19/23  at 2:00 pm with Laine Tousey, RN case manager.         The patient verbalized understanding of instructions, educational materials, and care plan provided today and  DECLINED offer to receive copy of patient instructions, educational materials, and care plan.   The patient has been provided with contact information for the care management team and has been advised to call with any health related questions or concerns.   Please call the care guide team at [phone] if you need to cancel or reschedule your appointment.   Please call the Suicide and Crisis Lifeline: 988 call the [place]  National Suicide Prevention Lifeline: 4633352433 or TTY: (530)403-0539 TTY 2294665404) to talk to a trained counselor call 1-800-273-TALK (toll free, 24 hour hotline) go to [facility] [address], [place] [phone]) if you are experiencing a Mental Health or Behavioral Health Crisis or need someone to talk to.  Arvin Seip RN, BSN, CCM  [employer], Population Health Case Manager Phone: [phone]

## 2023-12-13 ENCOUNTER — Encounter: Payer: Self-pay | Admitting: Family Medicine

## 2023-12-13 ENCOUNTER — Ambulatory Visit (INDEPENDENT_AMBULATORY_CARE_PROVIDER_SITE_OTHER): Admitting: Family Medicine

## 2023-12-13 VITALS — BP 124/54 | HR 61 | Ht 66.0 in | Wt 178.0 lb

## 2023-12-13 DIAGNOSIS — M4726 Other spondylosis with radiculopathy, lumbar region: Secondary | ICD-10-CM | POA: Diagnosis not present

## 2023-12-13 MED ORDER — OXYCODONE HCL 5 MG PO TABS: 5.0000 mg | ORAL_TABLET | Freq: Four times a day (QID) | ORAL | 0 refills | Status: DC | PRN

## 2023-12-13 MED ORDER — GABAPENTIN 100 MG PO CAPS: 100.0000 mg | ORAL_CAPSULE | Freq: Three times a day (TID) | ORAL | 0 refills | Status: DC | PRN

## 2023-12-13 MED ORDER — GABAPENTIN 100 MG PO CAPS: 100.0000 mg | ORAL_CAPSULE | Freq: Three times a day (TID) | ORAL | 3 refills | Status: DC

## 2023-12-19 ENCOUNTER — Telehealth: Payer: Self-pay | Admitting: *Deleted

## 2023-12-19 ENCOUNTER — Encounter: Payer: Self-pay | Admitting: *Deleted

## 2023-12-19 NOTE — Assessment & Plan Note (Signed)
 History of Present Illness Matthew Humphrey is a 78 year old male who presents with persistent left leg pain and numbness.  Left lower extremity radicular pain and paresthesia - Persistent pain, numbness, and tingling radiating down the left leg to the ankle - Symptoms have continued despite prior treatment with prednisone  - No improvement in leg pain with prednisone , although back pain resolved - Ongoing nerve symptoms since at least 2020, with prior MRI performed that year - Currently uses oxycodone  5 mg as needed for pain management, prescribed after a hospital visit  Functional impact - Works at [employer] on [address] [employer] - Concerned about the impact of symptoms on ability to work  Results LABS Kidney function: Elevated  RADIOLOGY Lumbar spine MRI: Nerve impingement (2020)  Assessment and Plan Left lumbosacral radiculopathy with chronic low back pain Chronic low back pain with left lumbosacral radiculopathy, persistent leg symptoms suggest lumbar nerve compression. Outdated MRI requires update. - Order updated MRI to assess nerve compression changes. - Refer to interventional spine specialists for potential targeted nerve injection based on MRI findings. - Prescribe gabapentin  100 mg, up to three times daily as needed, emphasize evening dosing. - Advise against NSAIDs due to nephrotoxicity risk. - Discuss oxycodone  for severe pain, caution regarding constipation and bowel management.  Chronic kidney disease Chronic kidney disease indicated by elevated kidney function tests. - Avoid NSAIDs to prevent further kidney damage.

## 2023-12-19 NOTE — Patient Instructions (Signed)
 Patient Plan  Left Lumbosacral Radiculopathy and Chronic Low Back Pain  - Updated MRI of the lumbar spine has been ordered. - Referral made to interventional spine specialists for possible targeted nerve injection after MRI results. - Take gabapentin  100 mg as needed, up to three times daily, preferably in the evening. - Avoid NSAIDs to protect kidney function. - Use oxycodone  for severe pain if needed; manage for possible constipation.  Chronic Kidney Disease  - Do not use NSAIDs to prevent further kidney damage.  Red flags - seek care if you notice:  - New or worsening numbness, tingling, or weakness in the legs - Loss of bladder or bowel control - Severe or sudden back pain - Severe constipation or difficulty having a bowel movement - New or worsening symptoms that concern you

## 2023-12-19 NOTE — Progress Notes (Signed)
 Primary Care / Sports Medicine Office Visit  Patient Information:  Patient ID: Matthew Humphrey, male DOB: March 08, 1946 Age: 78 y.o. MRN: [id number]   Matthew Humphrey is a pleasant 78 y.o. male presenting with the following:  Chief Complaint  Patient presents with   Back Pain    Low back pain with left leg pain. Pain radiates to foot. Pain level 6/10. No PT , No Xray's. Patient also having weakness, numbness, and tingling in left leg.     Vitals:   12/13/23 1057  BP: (!) 124/54  Pulse: 61  SpO2: 98%   Vitals:   12/13/23 1057  Weight: 178 lb (80.7 kg)  Height: 5' 6 (1.676 m)   Body mass index is 28.73 kg/m.  US  EKG SITE RITE Result Date: 11/22/2023 If [facility] image not attached, placement could not be confirmed due to current cardiac rhythm.  US  EKG SITE RITE Result Date: 11/22/2023 If [facility] image not attached, placement could not be confirmed due to current cardiac rhythm.    Discussed the use of AI scribe software for clinical note transcription with the patient, who gave verbal consent to proceed.   Independent interpretation of notes and tests performed by another provider:   See below  Procedures performed:   None  Pertinent History, Exam, Impression, and Recommendations:   Problem List Items Addressed This Visit     Osteoarthritis of spine with radiculopathy, lumbar region - Primary   History of Present Illness Matthew Humphrey is a 78 year old male who presents with persistent left leg pain and numbness.  Left lower extremity radicular pain and paresthesia - Persistent pain, numbness, and tingling radiating down the left leg to the ankle - Symptoms have continued despite prior treatment with prednisone  - No improvement in leg pain with prednisone , although back pain resolved - Ongoing nerve symptoms since at least 2020, with prior MRI performed that year - Currently uses oxycodone  5 mg as needed for pain management, prescribed after a  hospital visit  Functional impact - Works at [employer] on [address] [employer] - Concerned about the impact of symptoms on ability to work  Results LABS Kidney function: Elevated  RADIOLOGY Lumbar spine MRI: Nerve impingement (2020)  Assessment and Plan Left lumbosacral radiculopathy with chronic low back pain Chronic low back pain with left lumbosacral radiculopathy, persistent leg symptoms suggest lumbar nerve compression. Outdated MRI requires update. - Order updated MRI to assess nerve compression changes. - Refer to interventional spine specialists for potential targeted nerve injection based on MRI findings. - Prescribe gabapentin  100 mg, up to three times daily as needed, emphasize evening dosing. - Advise against NSAIDs due to nephrotoxicity risk. - Discuss oxycodone  for severe pain, caution regarding constipation and bowel management.  Chronic kidney disease Chronic kidney disease indicated by elevated kidney function tests. - Avoid NSAIDs to prevent further kidney damage.      Relevant Medications   oxyCODONE  (ROXICODONE ) 5 MG immediate release tablet   gabapentin  (NEURONTIN ) 100 MG capsule     Orders & Medications Medications:  Meds ordered this encounter  Medications   oxyCODONE  (ROXICODONE ) 5 MG immediate release tablet    Sig: Take 1 tablet (5 mg total) by mouth every 6 (six) hours as needed for severe pain (pain score 7-10) or moderate pain (pain score 4-6).    Dispense:  20 tablet    Refill:  0   DISCONTD: gabapentin  (NEURONTIN ) 100 MG capsule    Sig: Take 1  capsule (100 mg total) by mouth 3 (three) times daily.    Dispense:  90 capsule    Refill:  3   gabapentin  (NEURONTIN ) 100 MG capsule    Sig: Take 1 capsule (100 mg total) by mouth 3 (three) times daily as needed.    Dispense:  90 capsule    Refill:  0   No orders of the defined types were placed in this encounter.    No follow-ups on file.     Selinda JINNY Ku, MD, [facility]   Primary Care Sports  Medicine Primary Care and Sports Medicine at [facility]

## 2023-12-20 ENCOUNTER — Telehealth: Payer: Self-pay

## 2023-12-20 ENCOUNTER — Other Ambulatory Visit: Payer: Self-pay | Admitting: *Deleted

## 2023-12-20 NOTE — Transitions of Care (Post Inpatient/ED Visit) (Signed)
 Transition of Care week 4 day # 24  Visit Note  12/20/2023  Name: Matthew Humphrey MRN: [id number]          DOB: October 02, 1945  Situation: Patient enrolled in [facility] 30-day program. Visit completed with patient by telephone.   HIPAA identifiers x 2 verified  Background:  Recent hospitalization August 15-2- 24, 2025-  for partial lap colectomy secondary to colon mass; acute on chronic blood loss anemia (1) unplanned hospitalization x last (6) and (12) months  Initial Transition Care Management Follow-up Telephone Call    Past Medical History:  Diagnosis Date   Arthritis    knee   Chronic kidney disease    GERD (gastroesophageal reflux disease)    Hyperlipidemia    Hypertension    Assessment:  Nothing has really changed, taking all my medicine, the incision has healed and looks good.  No longer using the cane, feeling steady on my feet and even driving self.  I have no questions or concerns today    Denies clinical concerns and sounds to be in no distress throughout TOC 30-day program outreach call today  Patient Reported Symptoms: Cognitive Cognitive Status: Normal speech and language skills, Alert and oriented to person, place, and time, Insightful and able to interpret abstract concepts Cognitive/Intellectual Conditions Management [RPT]: None reported or documented in medical history or problem list      Neurological Neurological Review of Symptoms: No symptoms reported    HEENT HEENT Symptoms Reported: No symptoms reported      Cardiovascular Cardiovascular Symptoms Reported: No symptoms reported Other Cardiovascular Symptoms: Confirmed no medication changes/ questions/ concerns; confirmed not monitoring blood pressures at home Does patient have uncontrolled Hypertension?: No Cardiovascular Management Strategies: Medication therapy, Coping strategies, Adequate rest  Respiratory Respiratory Symptoms Reported: No symptoms reported Other Respiratory Symptoms: Denies  shortness of breath; sounds to be in no respiratory distress throughout TOC call Respiratory Management Strategies: Routine screening, Coping strategies  Endocrine Endocrine Symptoms Reported: No symptoms reported Is patient diabetic?: No    Gastrointestinal Gastrointestinal Symptoms Reported: No symptoms reported Additional Gastrointestinal Details: Reports fine appetite, eating good; pooping normally Gastrointestinal Management Strategies: Adequate rest, Medication therapy, Diet modification, Coping strategies    Genitourinary Genitourinary Symptoms Reported: No symptoms reported    Integumentary Integumentary Symptoms Reported: Incision Additional Integumentary Details: Continues to report incision has healed; Denies redness, drainage; Reinforced signs/ symptoms infection along with corresponding action plan- he is able to verbalize same without prompting Skin Management Strategies: Routine screening, Adequate rest, Coping strategies  Musculoskeletal Musculoskelatal Symptoms Reviewed: No symptoms reported Additional Musculoskeletal Details: confirmed not currently requiring/ using assistive devices for ambulation- states feels steady on his feet and no longer needs to use cane Musculoskeletal Management Strategies: Routine screening, Coping strategies      Psychosocial Psychosocial Symptoms Reported: No symptoms reported         There were no vitals filed for this visit.  Medications Reviewed Today     Reviewed by Salote Weidmann M, RN (Registered Nurse) on 12/20/23 at [phone]  Med List Status: <None>   Medication Order Taking? Sig Documenting Provider Last Dose Status Informant  acetaminophen  (TYLENOL ) 325 MG tablet [id number]  Take 2 tablets (650 mg total) by mouth every 4 (four) hours as needed for headache or mild pain (pain score 1-3). Fausto Sor A, DO  Active   amLODipine  (NORVASC ) 5 MG tablet [id number]  Take 5 mg by mouth daily. [provider]  Active Self   aspirin  EC 81 MG tablet  [id number]  Take 1 tablet (81 mg total) by mouth daily. Joshua Cathryne BROCKS, MD  Active Self  atorvastatin  (LIPITOR ) 80 MG tablet [id number]  TAKE 1 TABLET BY MOUTH ONCE  DAILY Jones, Deanna C, MD  Active Self  Baclofen  5 MG TABS [id number]  Take 1 tablet (5 mg total) by mouth 3 (three) times daily as needed. Matthews, Jason J, MD  Active Self  ferrous sulfate  325 (65 FE) MG tablet [id number]  Take 1 tablet (325 mg total) by mouth every other day. Fausto Sor A, DO  Active   gabapentin  (NEURONTIN ) 100 MG capsule [id number]  Take 1 capsule (100 mg total) by mouth 3 (three) times daily as needed. Matthews, Jason J, MD  Active   isosorbide  mononitrate (IMDUR ) 30 MG 24 hr tablet [id number]  Take 30 mg by mouth daily. [provider]  Active Self  metoprolol  tartrate (LOPRESSOR ) 25 MG tablet [id number]  Take 0.5 tablets (12.5 mg total) by mouth 2 (two) times daily. Joshua Cathryne BROCKS, MD  Active Self  nitroGLYCERIN  (NITROSTAT ) 0.4 MG SL tablet [id number]  Place 1 tablet (0.4 mg total) under the tongue every 5 (five) minutes as needed for chest pain. Joshua Cathryne BROCKS, MD  Active Self  ondansetron  (ZOFRAN -ODT) 4 MG disintegrating tablet [id number]  Take 1 tablet (4 mg total) by mouth every 8 (eight) hours as needed for nausea or vomiting.  Patient not taking: Reported on 12/13/2023   Fausto Sor A, DO  Active   oxyCODONE  (ROXICODONE ) 5 MG immediate release tablet [id number]  Take 1 tablet (5 mg total) by mouth every 6 (six) hours as needed for severe pain (pain score 7-10) or moderate pain (pain score 4-6). Alvia Selinda PARAS, MD  Active   pantoprazole  (PROTONIX ) 40 MG tablet [id number]  TAKE 1 TABLET BY MOUTH DAILY Jones, Deanna C, MD  Active Self  simethicone  (MYLICON) 80 MG chewable tablet [id number]  Chew 1 tablet (80 mg total) by mouth 4 (four) times daily for 15 days. Fausto Sor A, DO  Expired 12/13/23 2359            Recommendation:   PCP Follow-up- as scheduled  01/04/24 Continue Current Plan of Care  Follow Up Plan:   Telephone follow-up in 1 week- as scheduled 12/26/23- for TOC case closure if no hospital re-admission  Pls call/ message for questions,  Beatris Blinda Lawrence, RN, BSN, CCRN Alumnus RN Care Manager  Transitions of Care  [facility] - [facility] [phone]: direct office

## 2023-12-20 NOTE — Telephone Encounter (Unsigned)
 Copied from CRM [id number]. Topic: General - Other >> Dec 20, 2023  2:30 PM Larissa S wrote: Reason for CRM: Ozell with [employer] calling for verification of chronic condition due by 01/01/2024. Please call the [employer] verification line at [phone]. Request is also being faxed to clinic

## 2023-12-21 ENCOUNTER — Telehealth: Admitting: *Deleted

## 2023-12-21 NOTE — Telephone Encounter (Signed)
 Form received on 12/20/2023, form completed.   Fax to ([phone]) on 12/21/2023.

## 2023-12-26 ENCOUNTER — Encounter: Payer: Self-pay | Admitting: *Deleted

## 2023-12-26 ENCOUNTER — Telehealth: Payer: Self-pay

## 2023-12-26 ENCOUNTER — Telehealth: Payer: Self-pay | Admitting: *Deleted

## 2023-12-26 NOTE — Telephone Encounter (Signed)
 Copied from CRM #[id number]. Topic: General - Other >> Dec 26, 2023  3:10 PM Santiya F wrote: Reason for CRM: Patient is calling in returning a call from the office. Please follow up with patient.

## 2023-12-27 ENCOUNTER — Other Ambulatory Visit: Admitting: *Deleted

## 2023-12-27 ENCOUNTER — Other Ambulatory Visit: Payer: Self-pay | Admitting: *Deleted

## 2023-12-27 DIAGNOSIS — C182 Malignant neoplasm of ascending colon: Secondary | ICD-10-CM

## 2023-12-27 DIAGNOSIS — R195 Other fecal abnormalities: Secondary | ICD-10-CM

## 2023-12-27 DIAGNOSIS — I10 Essential (primary) hypertension: Secondary | ICD-10-CM

## 2023-12-27 DIAGNOSIS — K6389 Other specified diseases of intestine: Secondary | ICD-10-CM

## 2023-12-27 NOTE — Patient Instructions (Signed)
 Visit Information  Thank you for taking time to visit with me today. Please don't hesitate to contact me if I can be of assistance to you before our next scheduled telephone appointment.  It has been a pleasure working with you over the last 30 days!  Great job managing your care after your hospital visit!  Please listen for a call from the scheduling care guide to schedule a phone call with the new nurse care manager who will pick up in your care where we are leaving off today  Following are the goals we discussed today:  Patient Self Care Activities:  Attend all scheduled provider appointments Call provider office for new concerns or questions  Take medications as prescribed   Continue pacing activity as your recuperation from recent surgery continues Use assistive devices if needed to prevent falls- your cane- if you feel unsteady when walking Please make sure you are taking all of your medications exactly the way you are supposed to If you believe your condition is getting worse- contact your care providers (doctors) promptly- reaching out to your doctor early when you have concerns can prevent you from having to go to the hospital Seek emergency medical services for severe symptoms such as SOB, chest pain, stroke like symptoms and/ or severe bleeding Continue to monitor for signs/ symptoms GI bleeding as discussed: if you notice signs or symptoms: please contact your care providers promptly and/ or seek emergency care  If you are experiencing a Mental Health or Behavioral Health Crisis or need someone to talk to, please  call the Suicide and Crisis Lifeline: 988 call the [place]  National Suicide Prevention Lifeline: 7091282839 or TTY: 561-670-2136 TTY 208-597-9524) to talk to a trained counselor call 1-800-273-TALK (toll free, 24 hour hotline) go to [facility] [address] [place] [phone]) call the [facility]:  [phone] call 911   The patient verbalized understanding of instructions, educational materials, and care plan provided today and DECLINED offer to receive copy of patient instructions, educational materials, and care plan.   Pls call/ message for questions,  Matthew Vivona Mckinney Chanah Tidmore, RN, BSN, CCRN Alumnus RN Care Manager  Transitions of Care  [facility] - [facility] [phone]: direct office

## 2023-12-27 NOTE — Transitions of Care (Post Inpatient/ED Visit) (Signed)
 Transition of Care week # 5/ day # 31 TOC 30-day program case closure  Visit Note  12/27/2023  Name: Matthew Humphrey MRN: [id number]          DOB: December 31, 1945  Situation: Patient enrolled in [facility] 30-day program. Visit completed with patient by telephone.   HIPAA identifiers x 2 verified  Background:  Recent hospitalization August 15-2- 24, 2025-  for partial lap colectomy secondary to colon mass; acute on chronic blood loss anemia (1) unplanned hospitalization x last (6) and (12) months  Initial Transition Care Management Follow-up Telephone Call    Past Medical History:  Diagnosis Date   Arthritis    knee   Chronic kidney disease    GERD (gastroesophageal reflux disease)    Hyperlipidemia    Hypertension    Assessment:  Doing fine, taking all my medicine, the incision has healed and still looks good.  Not needing cane, feeling steady on my feet and still driving.  Not checking blood pressures at home, I don't want to and don't think it is necessary.  Going to my upcoming doctor appointments; no questions or concerns today    Denies clinical concerns and sounds to be in no distress throughout TOC 30-day program outreach call today  Patient Reported Symptoms: Cognitive Cognitive Status: Normal speech and language skills, Insightful and able to interpret abstract concepts, Alert and oriented to person, place, and time Cognitive/Intellectual Conditions Management [RPT]: None reported or documented in medical history or problem list      Neurological Neurological Review of Symptoms: No symptoms reported    HEENT HEENT Symptoms Reported: No symptoms reported      Cardiovascular Cardiovascular Symptoms Reported: No symptoms reported Other Cardiovascular Symptoms: Confirmed not monitoring blood pressures at home: I don't want to; Confirmed continues taking metoprolol  as prescribed (recently reduced dose) Does patient have uncontrolled Hypertension?: No Cardiovascular  Management Strategies: Medication therapy, Coping strategies, Routine screening, Adequate rest  Respiratory Respiratory Symptoms Reported: No symptoms reported Other Respiratory Symptoms: Denies shortness of breath; sounds to be in non respiratory distress throughout [facility] call Respiratory Management Strategies: Coping strategies, Routine screening  Endocrine Endocrine Symptoms Reported: No symptoms reported Is patient diabetic?: No    Gastrointestinal Gastrointestinal Symptoms Reported: No symptoms reported Additional Gastrointestinal Details: Continues to report: good appetite, regular BM's- last 12/26/23; confirmed no signs/ symptoms recurrent GI bleeding Gastrointestinal Management Strategies: Coping strategies    Genitourinary Genitourinary Symptoms Reported: No symptoms reported    Integumentary Integumentary Symptoms Reported: Incision Additional Integumentary Details: Continues to report incision completely healed; denies redness, drainage, swelling around incision site Skin Management Strategies: Routine screening, Coping strategies  Musculoskeletal Musculoskelatal Symptoms Reviewed: No symptoms reported Additional Musculoskeletal Details: confirmed not currently requiring/ using assistive devices for ambulation; confirmed no new/ recent falls Musculoskeletal Management Strategies: Coping strategies, Routine screening      Psychosocial Psychosocial Symptoms Reported: No symptoms reported Behavioral Management Strategies: Support system Major Change/Loss/Stressor/Fears (CP): Denies Quality of Family Relationships: helpful, involved, supportive   There were no vitals filed for this visit.  Medications Reviewed Today     Reviewed by Delante Karapetyan M, RN (Registered Nurse) on 12/27/23 at 1013  Med List Status: <None>   Medication Order Taking? Sig Documenting Provider Last Dose Status Informant  acetaminophen  (TYLENOL ) 325 MG tablet [id number]  Take 2 tablets (650 mg total) by  mouth every 4 (four) hours as needed for headache or mild pain (pain score 1-3). Fausto Sor A, DO  Active   amLODipine  (NORVASC ) 5  MG tablet [id number]  Take 5 mg by mouth daily. [provider]  Active Self  aspirin  EC 81 MG tablet [id number]  Take 1 tablet (81 mg total) by mouth daily. Joshua Cathryne BROCKS, MD  Active Self  atorvastatin  (LIPITOR ) 80 MG tablet [id number]  TAKE 1 TABLET BY MOUTH ONCE  DAILY Jones, Deanna C, MD  Active Self  Baclofen  5 MG TABS [id number]  Take 1 tablet (5 mg total) by mouth 3 (three) times daily as needed. Matthews, Jason J, MD  Active Self  ferrous sulfate  325 (65 FE) MG tablet [id number]  Take 1 tablet (325 mg total) by mouth every other day. Fausto Sor A, DO  Active   gabapentin  (NEURONTIN ) 100 MG capsule [id number]  Take 1 capsule (100 mg total) by mouth 3 (three) times daily as needed. Matthews, Jason J, MD  Active   isosorbide  mononitrate (IMDUR ) 30 MG 24 hr tablet [id number]  Take 30 mg by mouth daily. [provider]  Active Self  metoprolol  tartrate (LOPRESSOR ) 25 MG tablet [id number]  Take 0.5 tablets (12.5 mg total) by mouth 2 (two) times daily. Joshua Cathryne BROCKS, MD  Active Self  nitroGLYCERIN  (NITROSTAT ) 0.4 MG SL tablet [id number]  Place 1 tablet (0.4 mg total) under the tongue every 5 (five) minutes as needed for chest pain. Joshua Cathryne BROCKS, MD  Active Self  ondansetron  (ZOFRAN -ODT) 4 MG disintegrating tablet [id number]  Take 1 tablet (4 mg total) by mouth every 8 (eight) hours as needed for nausea or vomiting.  Patient not taking: Reported on 12/13/2023   Fausto Sor A, DO  Active   oxyCODONE  (ROXICODONE ) 5 MG immediate release tablet [id number]  Take 1 tablet (5 mg total) by mouth every 6 (six) hours as needed for severe pain (pain score 7-10) or moderate pain (pain score 4-6). Matthews, Jason J, MD  Active   pantoprazole  (PROTONIX ) 40 MG tablet [id number]  TAKE 1 TABLET BY MOUTH DAILY Jones, Deanna C, MD  Active Self  simethicone  (MYLICON)  80 MG chewable tablet [id number]  Chew 1 tablet (80 mg total) by mouth 4 (four) times daily for 15 days. Fausto Sor LABOR, DO  Expired 12/13/23 2359            Recommendation:   PCP Follow-up- as scheduled 01/04/24 Specialty provider follow-up- as scheduled 01/07/24 Continue Current Plan of Care  Follow Up Plan:   Referral to RN Case Manager Closing From:  Transitions of Care Program  Pls call/ message for questions,  Xitlally Mooneyham Mckinney Rhyland Hinderliter, RN, BSN, CCRN Alumnus RN Care Manager  Transitions of Care  [facility] - [facility] [phone]: direct office

## 2024-01-04 ENCOUNTER — Encounter: Payer: Self-pay | Admitting: Internal Medicine

## 2024-01-04 ENCOUNTER — Other Ambulatory Visit: Payer: Self-pay | Admitting: Family Medicine

## 2024-01-04 ENCOUNTER — Encounter: Payer: Self-pay | Admitting: Family Medicine

## 2024-01-04 ENCOUNTER — Ambulatory Visit (INDEPENDENT_AMBULATORY_CARE_PROVIDER_SITE_OTHER): Admitting: Family Medicine

## 2024-01-04 VITALS — BP 138/82 | HR 60 | Ht 66.0 in | Wt 180.0 lb

## 2024-01-04 DIAGNOSIS — Z131 Encounter for screening for diabetes mellitus: Secondary | ICD-10-CM | POA: Diagnosis not present

## 2024-01-04 DIAGNOSIS — Z13220 Encounter for screening for lipoid disorders: Secondary | ICD-10-CM

## 2024-01-04 DIAGNOSIS — Z136 Encounter for screening for cardiovascular disorders: Secondary | ICD-10-CM

## 2024-01-04 DIAGNOSIS — Z23 Encounter for immunization: Secondary | ICD-10-CM | POA: Diagnosis not present

## 2024-01-04 DIAGNOSIS — N1831 Chronic kidney disease, stage 3a: Secondary | ICD-10-CM

## 2024-01-04 DIAGNOSIS — D62 Acute posthemorrhagic anemia: Secondary | ICD-10-CM | POA: Diagnosis not present

## 2024-01-04 DIAGNOSIS — M4726 Other spondylosis with radiculopathy, lumbar region: Secondary | ICD-10-CM

## 2024-01-04 NOTE — Progress Notes (Signed)
 Established Patient Office Visit  Subjective   Patient ID: Matthew Humphrey, male    DOB: 1946-03-26  Age: 78 y.o. MRN: [id number]  No chief complaint on file.    Assessment & Plan:   Problem List Items Addressed This Visit   None Visit Diagnoses       Stage 3a chronic kidney disease (HCC)    -  Primary   Relevant Orders   Comprehensive Metabolic Panel (CMET)     Acute on chronic blood loss anemia       Relevant Orders   CBC with Differential     Encounter for immunization       Relevant Orders   Flu vaccine HIGH DOSE PF(Fluzone Trivalent) (Completed)     Assessment and Plan Assessment & Plan Colon cancer, status post right hemicolectomy Follow-up with oncology scheduled for October 6 to determine need for further treatment.  Acute on chronic blood loss anemia No symptoms of lightheadedness or blood in stool reported. - Check blood count to assess improvement in anemia. - Ensure continued use of iron  tablets.  Stage 3a chronic kidney disease Monitoring of kidney function planned. - Check kidney function.  Osteoarthritis, left lower leg Persistent pain despite medication adjustments. Occasional oxycodone  provides relief. Awaiting MRI scheduling. - Check status of MRI scheduling. - Continue current pain management regimen with Tylenol  and oxycodone  as needed.  General Health Maintenance He has stopped smoking and reports feeling better since cessation. - Assist with MyChart sign-up for better management of health records.    Return in about 3 months (around 04/05/2024).   HPI Discussed the use of AI scribe software for clinical note transcription with the patient, who gave verbal consent to proceed.  History of Present Illness Matthew Humphrey is a 78 year old male who presents for a follow-up visit for prior visit  He continues to experience left leg pain despite medication adjustments. He takes oxycodone  occasionally when Tylenol  is insufficient, and  finds it effective.  He has noticed a recent weight gain and confirms eating well. He is taking his medications as prescribed, including iron  tablets for anemia.  He has stopped smoking and feels better since quitting.  No lightheadedness or blood in the stool.     Review of Systems  All other systems reviewed and are negative.     Objective:     BP 138/82   Pulse 60   Ht 5' 6 (1.676 m)   Wt 180 lb (81.6 kg)   SpO2 96%   BMI 29.05 kg/m    Physical Exam Vitals and nursing note reviewed.  Constitutional:      Appearance: Normal appearance.  HENT:     Head: Normocephalic.     Right Ear: External ear normal.     Left Ear: External ear normal.  Eyes:     Conjunctiva/sclera: Conjunctivae normal.  Cardiovascular:     Rate and Rhythm: Normal rate.  Pulmonary:     Effort: Pulmonary effort is normal. No respiratory distress.  Abdominal:     Palpations: Abdomen is soft.  Musculoskeletal:        General: Normal range of motion.  Skin:    General: Skin is warm.  Neurological:     Mental Status: He is alert and oriented to person, place, and time.  Psychiatric:        Mood and Affect: Mood normal.      No results found for any visits on 01/04/24.    The 10-year  ASCVD risk score (Arnett DK, et al., 2019) is: 25.4%      Vinary K Heman Que, MD

## 2024-01-05 LAB — LIPID PANEL
Chol/HDL Ratio: 2.8 ratio (ref 0.0–5.0)
Cholesterol, Total: 153 mg/dL (ref 100–199)
HDL: 54 mg/dL (ref >39)
LDL Chol Calc (NIH): 86 mg/dL (ref 0–99)
Triglycerides: 63 mg/dL (ref 0–149)
VLDL Cholesterol Cal: 13 mg/dL (ref 5–40)

## 2024-01-05 LAB — CBC WITH DIFFERENTIAL/PLATELET
Basophils Absolute: 0 x10E3/uL (ref 0.0–0.2)
Basos: 1 %
EOS (ABSOLUTE): 0.1 x10E3/uL (ref 0.0–0.4)
Eos: 3 %
Hematocrit: 45.5 % (ref 37.5–51.0)
Hemoglobin: 13.7 g/dL (ref 13.0–17.7)
Immature Grans (Abs): 0 x10E3/uL (ref 0.0–0.1)
Immature Granulocytes: 0 %
Lymphocytes Absolute: 1.4 x10E3/uL (ref 0.7–3.1)
Lymphs: 32 %
MCH: 25.8 pg — ABNORMAL LOW (ref 26.6–33.0)
MCHC: 30.1 g/dL — ABNORMAL LOW (ref 31.5–35.7)
MCV: 86 fL (ref 79–97)
Monocytes Absolute: 0.4 x10E3/uL (ref 0.1–0.9)
Monocytes: 8 %
Neutrophils Absolute: 2.5 x10E3/uL (ref 1.4–7.0)
Neutrophils: 56 %
Platelets: 259 x10E3/uL (ref 150–450)
RBC: 5.31 x10E6/uL (ref 4.14–5.80)
WBC: 4.5 x10E3/uL (ref 3.4–10.8)

## 2024-01-05 LAB — COMPREHENSIVE METABOLIC PANEL WITH GFR
ALT: 49 IU/L — ABNORMAL HIGH (ref 0–44)
AST: 37 IU/L (ref 0–40)
Albumin: 4.2 g/dL (ref 3.8–4.8)
Alkaline Phosphatase: 111 IU/L (ref 47–123)
BUN/Creatinine Ratio: 17 (ref 10–24)
BUN: 20 mg/dL (ref 8–27)
Bilirubin Total: 0.4 mg/dL (ref 0.0–1.2)
CO2: 26 mmol/L (ref 20–29)
Calcium: 10.2 mg/dL (ref 8.6–10.2)
Chloride: 104 mmol/L (ref 96–106)
Creatinine, Ser: 1.21 mg/dL (ref 0.76–1.27)
Globulin, Total: 3 g/dL (ref 1.5–4.5)
Glucose: 100 mg/dL — ABNORMAL HIGH (ref 70–99)
Potassium: 5 mmol/L (ref 3.5–5.2)
Sodium: 142 mmol/L (ref 134–144)
Total Protein: 7.2 g/dL (ref 6.0–8.5)
eGFR: 62 mL/min/1.73 (ref >59)

## 2024-01-05 LAB — HEMOGLOBIN A1C
Est. average glucose Bld gHb Est-mCnc: 103 mg/dL
Hgb A1c MFr Bld: 5.2 % (ref 4.8–5.6)

## 2024-01-06 LAB — SIGNATERA
SIGNATERA MTM READOUT: 0 MTM/ml
SIGNATERA TEST RESULT: NEGATIVE

## 2024-01-07 ENCOUNTER — Inpatient Hospital Stay

## 2024-01-07 ENCOUNTER — Ambulatory Visit: Payer: Self-pay | Admitting: Family Medicine

## 2024-01-07 ENCOUNTER — Inpatient Hospital Stay: Attending: Internal Medicine | Admitting: Internal Medicine

## 2024-01-07 ENCOUNTER — Telehealth: Payer: Self-pay

## 2024-01-07 ENCOUNTER — Encounter: Payer: Self-pay | Admitting: Internal Medicine

## 2024-01-07 VITALS — BP 130/76 | HR 57 | Temp 97.3°F | Resp 20 | Ht 66.0 in | Wt 183.3 lb

## 2024-01-07 DIAGNOSIS — C182 Malignant neoplasm of ascending colon: Secondary | ICD-10-CM

## 2024-01-07 DIAGNOSIS — N182 Chronic kidney disease, stage 2 (mild): Secondary | ICD-10-CM | POA: Insufficient documentation

## 2024-01-07 DIAGNOSIS — Z79899 Other long term (current) drug therapy: Secondary | ICD-10-CM | POA: Diagnosis not present

## 2024-01-07 DIAGNOSIS — Z9049 Acquired absence of other specified parts of digestive tract: Secondary | ICD-10-CM | POA: Insufficient documentation

## 2024-01-07 DIAGNOSIS — I129 Hypertensive chronic kidney disease with stage 1 through stage 4 chronic kidney disease, or unspecified chronic kidney disease: Secondary | ICD-10-CM | POA: Diagnosis not present

## 2024-01-07 DIAGNOSIS — Z87891 Personal history of nicotine dependence: Secondary | ICD-10-CM | POA: Insufficient documentation

## 2024-01-07 DIAGNOSIS — Z882 Allergy status to sulfonamides status: Secondary | ICD-10-CM | POA: Insufficient documentation

## 2024-01-07 DIAGNOSIS — Z8249 Family history of ischemic heart disease and other diseases of the circulatory system: Secondary | ICD-10-CM | POA: Insufficient documentation

## 2024-01-07 DIAGNOSIS — I2510 Atherosclerotic heart disease of native coronary artery without angina pectoris: Secondary | ICD-10-CM | POA: Insufficient documentation

## 2024-01-07 LAB — GENETIC SCREENING ORDER

## 2024-01-07 NOTE — Assessment & Plan Note (Addendum)
#   AUG 2025- Colon, segmental resection for tumor, right :     - INVASIVE COLORECTAL ADENOCARCINOMA.  PpT3N0- stage IIA- FIFTEEN LYMPH NODES NEGATIVE FOR MALIGNANCY (0/15). Macroscopic Tumor Perforation: Not identified Lymphatic and/or Vascular Invasion: Present; Perineural Invasion: Not identified Tumor Budding Score: Low (0-4). MMR- STABLE.   # stage II right-sided colon cancer-negative for any significant high risk features-except for lymphovascular invasion.  SEP 2025- Signatera testing- NEGATIVE.  Would not recommend any adjuvant chemotherapy-as the risk of recurrence is around 10%; and adjuvant chemotherapy will not meaningfully reduce any further recurrence. WILL check for PICK-3 Mutation to consider ASPRIN prophylaxes.   Referral to [facility] regarding-follow-up colonoscopy within a year/diagnosis of colon cancer  # Recommend surveillance colonoscopy in 1 year.  Continue follow-up with CT DNA testing per protocol; and also imaging in 6 months..  # Iron  deficiency anemia-secondary to #1/chronic GI bleed-patient on oral iron - HOLD  Venofer  today.   # DISPOSITION: # no venofer   # Referral to [facility] regarding-follow-up colonoscopy within a year/diagnosis of colon cancer # follow up in 6 months MD; labs- cbc/cmp; CE[facility]; CT [facility]- Dr.B

## 2024-01-07 NOTE — Progress Notes (Signed)
 Fatigue/weakness: YES Dyspena: NO  Light headedness: YES Blood in stool: NO

## 2024-01-07 NOTE — Progress Notes (Signed)
 Hello, Your blood count improved, you are not anemic anymore but please make sure you continue iron  pills. Your kidney function is normal. Your liver function improved. Continue hydrating well.

## 2024-01-07 NOTE — Telephone Encounter (Signed)
 PIK3CA by PCR requested on specimen [id number] with [facility].

## 2024-01-07 NOTE — Progress Notes (Signed)
 [facility] CONSULT NOTE  Patient Care Team: Kotturi, Vinay K, MD as PCP - General (Family Medicine) Zaleigh Bermingham R, MD as Consulting Physician (Oncology) Maurie Rayfield BIRCH, RN as Oncology Nurse Navigator  CHIEF COMPLAINTS/PURPOSE OF CONSULTATION: colon cancer  Oncology History Overview Note  FINAL DIAGNOSIS       1. Colon, segmental resection for tumor, right :      - INVASIVE COLORECTAL ADENOCARCINOMA.      - SEE CANCER SUMMARY BELOW.      - FIFTEEN LYMPH NODES NEGATIVE FOR MALIGNANCY (0/15).      - FIBROUS SEROSAL ADHESIONS.      - CHANGES CONSISTENT WITH PRIOR BIOPSY AND TATTOO INK.      - UNREMARKABLE PROXIMAL SMALL INTESTINE, DISTAL COLONIC, AND MESENTERIC      RESECTION MARGINS.      - UNREMARKABLE APPENDIX.      - INCIDENTAL MESENTERIC NODULE OF FOCALLY CALCIFIED ORGANIZING FAT NECROSIS.       DATE SIGNED OUT: 11/23/2023 ELECTRONIC SIGNATURE : [name], [name] , Pathologist, Electronic Signature  MICROSCOPIC DESCRIPTION 1. CASE SUMMARY: COLON AND RECTUM RESECTION Standard(s): AJCC 8  SPECIMEN Procedure: Right hemicolectomy  TUMOR Tumor Site: Ascending colon Histologic Type: Adenocarcinoma Histologic Grade: G2, moderately differentiated Tumor Size: Greatest dimension: 7.5 cm Tumor Extent: Invades through muscularis propria into the pericolic tissue Macroscopic Tumor Perforation: Not identified Lymphatic and/or Vascular Invasion: Present Small vessel Large vessel (venous), intramural Large vessel (venous), extramural Perineural Invasion: Not identified Tumor Budding Score: Low (0-4) Treatment Effect: No known presurgical therapy  MARGINS Margin Status for Invasive Carcinoma: All margins negative for invasive carcinoma Margin Status for Non-Invasive Tumor: All margins negative for high-grade dysplasia/intramucosal carcinoma and low-grade dysplasia  REGIONAL LYMPH NODES Regional Lymph Node Status: All regional lymph nodes negative  for tumor Number of Lymph Nodes Examined: 15 Tumor Deposits: Not identified  DISTANT METASTASES Distant Site(s) Involved, if applicable: Not applicable  PATHOLOGIC STAGE CLASSIFICATION (pTNM, AJCC 8th Edition):   Modified Classification: Not applicable pT3 T suffix: Not applicable pN0 pM: Not applicable    Cancer of right colon (HCC)  12/05/2023 Initial Diagnosis   Cancer of right colon (HCC)   12/05/2023 Cancer Staging   Staging form: Colon and Rectum, AJCC 8th Edition - Clinical: Stage IIA (cT3, cN0, cM0) - Signed by Rennie Cindy SAUNDERS, MD on 12/05/2023 Total positive nodes: 0    HISTORY OF PRESENTING ILLNESS: Patient ambulating- independently.   Alone/Accompanied by girlfriend Khalin Royce Durbin 78 y.o.  male pleasant patient with patient with a history of CAD on aspirin  s/p CABG 2012 CKD  with stage II right-sided colon cancer Status post right hemicolectomy-is here for follow-up/review results of the CT DNA.  Currently healing well.  Denies any nausea vomiting denies abdominal pain.  He continues to be on oral iron .  No constipation.  Review of Systems  Constitutional:  Negative for chills, diaphoresis, fever, malaise/fatigue and weight loss.  HENT:  Negative for nosebleeds and sore throat.   Eyes:  Negative for double vision.  Respiratory:  Negative for cough, hemoptysis, sputum production, shortness of breath and wheezing.   Cardiovascular:  Negative for chest pain, palpitations, orthopnea and leg swelling.  Gastrointestinal:  Negative for abdominal pain, blood in stool, constipation, diarrhea, heartburn, melena, nausea and vomiting.  Genitourinary:  Negative for dysuria, frequency and urgency.  Musculoskeletal:  Negative for back pain and joint pain.  Skin: Negative.  Negative for itching and rash.  Neurological:  Negative for dizziness,  tingling, focal weakness, weakness and headaches.  Endo/Heme/Allergies:  Does not bruise/bleed easily.   Psychiatric/Behavioral:  Negative for depression. The patient is not nervous/anxious and does not have insomnia.     MEDICAL HISTORY:  Past Medical History:  Diagnosis Date   Arthritis    knee   Chronic kidney disease    GERD (gastroesophageal reflux disease)    Hyperlipidemia    Hypertension     SURGICAL HISTORY: Past Surgical History:  Procedure Laterality Date   CARDIAC SURGERY  03/2010   bypass   CERVICAL FUSION  12/11/2012   C4-5,C5-6,C6-7   COLONOSCOPY  2011 ?   COLONOSCOPY  11/18/2023   Procedure: COLONOSCOPY;  Surgeon: Onita Elspeth Sharper, DO;  Location: [facility] ENDOSCOPY;  Service: Gastroenterology;;   COLONOSCOPY WITH PROPOFOL  N/A 07/11/2017   Procedure: COLONOSCOPY WITH PROPOFOL ;  Surgeon: Unk Corinn Skiff, MD;  Location: [facility]ERY [facility];  Service: Endoscopy;  Laterality: N/A;   ESOPHAGOGASTRODUODENOSCOPY N/A 11/18/2023   Procedure: EGD (ESOPHAGOGASTRODUODENOSCOPY);  Surgeon: Onita Elspeth Sharper, DO;  Location: [facility] ENDOSCOPY;  Service: Gastroenterology;  Laterality: N/A;   POLYPECTOMY N/A 07/11/2017   Procedure: POLYPECTOMY;  Surgeon: Unk Corinn Skiff, MD;  Location: [facility];  Service: Endoscopy;  Laterality: N/A;    SOCIAL HISTORY: Social History   Socioeconomic History   Marital status: Single    Spouse name: Not on file   Number of children: 3   Years of education: Not on file   Highest education level: 9th grade  Occupational History   Occupation: Retired  Tobacco Use   Smoking status: Former    Current packs/day: 0.00    Average packs/day: 1.5 packs/day for 30.0 years (45.0 ttl pk-yrs)    Types: Cigarettes    Start date: 34    Quit date: 1997    Years since quitting: 28.7   Smokeless tobacco: Former    Types: Chew   Tobacco comments:    smoking cessation materials not required  Vaping Use   Vaping status: Never Used  Substance and Sexual Activity   Alcohol use: No    Alcohol/week: 0.0 standard drinks of alcohol    Drug use: No   Sexual activity: Not Currently  Other Topics Concern   Not on file  Social History Narrative   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, lives alone   Social Drivers of Corporate investment banker Strain: Low Risk  (02/01/2022)   Overall Financial Resource Strain (CARDIA)    Difficulty of Paying Living Expenses: Not hard at all  Food Insecurity: No Food Insecurity (12/05/2023)   Hunger Vital Sign    Worried About Running Out of Food in the Last Year: Never true    Ran Out of Food in the Last Year: Never true  Transportation Needs: No Transportation Needs (12/05/2023)   PRAPARE - Administrator, Civil Service (Medical): No    Lack of Transportation (Non-Medical): No  Physical Activity: Inactive (06/21/2020)   Exercise Vital Sign    Days of Exercise per Week: 0 days    Minutes of Exercise per Session: 0 min  Stress: No Stress Concern Present (02/01/2022)   [employer] of Occupational Health - Occupational Stress Questionnaire    Feeling of Stress : Not at all  Social Connections: Socially Isolated (11/17/2023)   Social Connection and Isolation Panel    Frequency of Communication with Friends and Family: Three times a week    Frequency of Social Gatherings with Friends and Family:  Once a week    Attends Religious Services: Never    Active Member of Clubs or Organizations: No    Attends Banker Meetings: Never    Marital Status: Divorced  Catering manager Violence: Not At Risk (12/05/2023)   Humiliation, Afraid, Rape, and Kick questionnaire    Fear of Current or Ex-Partner: No    Emotionally Abused: No    Physically Abused: No    Sexually Abused: No    FAMILY HISTORY: Family History  Problem Relation Age of Onset   Hypertension Father    Hypotension Mother     ALLERGIES:  is allergic to sulfa antibiotics, sulfamethoxazole-trimethoprim, and sulfasalazine.  MEDICATIONS:  Current Outpatient Medications  Medication Sig Dispense  Refill   acetaminophen  (TYLENOL ) 325 MG tablet Take 2 tablets (650 mg total) by mouth every 4 (four) hours as needed for headache or mild pain (pain score 1-3).     amLODipine  (NORVASC ) 5 MG tablet Take 5 mg by mouth daily.     aspirin  EC 81 MG tablet Take 1 tablet (81 mg total) by mouth daily. 90 tablet 3   atorvastatin  (LIPITOR ) 80 MG tablet TAKE 1 TABLET BY MOUTH ONCE  DAILY 90 tablet 3   Baclofen  5 MG TABS Take 1 tablet (5 mg total) by mouth 3 (three) times daily as needed. 30 tablet 0   ferrous sulfate  325 (65 FE) MG tablet Take 1 tablet (325 mg total) by mouth every other day. 30 tablet 0   gabapentin  (NEURONTIN ) 100 MG capsule Take 1 capsule (100 mg total) by mouth 3 (three) times daily as needed. 90 capsule 0   isosorbide  mononitrate (IMDUR ) 30 MG 24 hr tablet Take 30 mg by mouth daily.     metoprolol  tartrate (LOPRESSOR ) 25 MG tablet Take 0.5 tablets (12.5 mg total) by mouth 2 (two) times daily. 90 tablet 1   nitroGLYCERIN  (NITROSTAT ) 0.4 MG SL tablet Place 1 tablet (0.4 mg total) under the tongue every 5 (five) minutes as needed for chest pain. 50 tablet 3   ondansetron  (ZOFRAN -ODT) 4 MG disintegrating tablet Take 1 tablet (4 mg total) by mouth every 8 (eight) hours as needed for nausea or vomiting. 20 tablet 0   oxyCODONE  (ROXICODONE ) 5 MG immediate release tablet Take 1 tablet (5 mg total) by mouth every 6 (six) hours as needed for severe pain (pain score 7-10) or moderate pain (pain score 4-6). 20 tablet 0   pantoprazole  (PROTONIX ) 40 MG tablet TAKE 1 TABLET BY MOUTH DAILY 90 tablet 3   simethicone  (MYLICON) 80 MG chewable tablet Chew 1 tablet (80 mg total) by mouth 4 (four) times daily for 15 days. 60 tablet 0   No current facility-administered medications for this visit.    PHYSICAL EXAMINATION:   Vitals:   01/07/24 0848  BP: 130/76  Pulse: (!) 57  Resp: 20  Temp: (!) 97.3 F (36.3 C)  SpO2: 98%   Filed Weights   01/07/24 0848  Weight: 183 lb 4.8 oz (83.1 kg)     Physical Exam Vitals and nursing note reviewed.  HENT:     Head: Normocephalic and atraumatic.     Mouth/Throat:     Pharynx: Oropharynx is clear.  Eyes:     Extraocular Movements: Extraocular movements intact.     Pupils: Pupils are equal, round, and reactive to light.  Cardiovascular:     Rate and Rhythm: Normal rate and regular rhythm.  Pulmonary:     Comments: Decreased breath sounds bilaterally.  Abdominal:  Palpations: Abdomen is soft.  Musculoskeletal:        General: Normal range of motion.     Cervical back: Normal range of motion.  Skin:    General: Skin is warm.  Neurological:     General: No focal deficit present.     Mental Status: He is alert and oriented to person, place, and time.  Psychiatric:        Behavior: Behavior normal.        Judgment: Judgment normal.     LABORATORY DATA:  I have reviewed the data as listed Lab Results  Component Value Date   WBC 4.5 01/04/2024   HGB 13.7 01/04/2024   HCT 45.5 01/04/2024   MCV 86 01/04/2024   PLT 259 01/04/2024   Recent Labs    11/24/23 0512 11/25/23 0552 11/30/23 0924 12/05/23 1450 01/04/24 1042  NA 138 139 141 137 142  K 3.8 4.1 4.6 4.4 5.0  CL 104 107 105 104 104  CO2 26 29 22 24 26   GLUCOSE 96 99 94 111* 100*  BUN 8 6* 17 31* 20  CREATININE 1.22 1.25* 1.16 1.26* 1.21  CALCIUM  9.0 9.0 9.6 9.5 10.2  GFRNONAA >60 59*  --  59*  --   PROT  --   --  6.4 7.4 7.2  ALBUMIN  --   --  3.6* 3.7 4.2  AST  --   --  106* 57* 37  ALT  --   --  129* 74* 49*  ALKPHOS  --   --  115 96 111  BILITOT  --   --  <0.2 0.4 0.4    RADIOGRAPHIC STUDIES: I have personally reviewed the radiological images as listed and agreed with the findings in the report. No results found.    Cancer of right colon [facility]) # AUG 2025- Colon, segmental resection for tumor, right :     - INVASIVE COLORECTAL ADENOCARCINOMA.  PpT3N0- stage IIA- FIFTEEN LYMPH NODES NEGATIVE FOR MALIGNANCY (0/15). Macroscopic Tumor Perforation:  Not identified Lymphatic and/or Vascular Invasion: Present; Perineural Invasion: Not identified Tumor Budding Score: Low (0-4). MMR- STABLE.   # stage II right-sided colon cancer-negative for any significant high risk features-except for lymphovascular invasion.  SEP 2025- Signatera testing- NEGATIVE.  Would not recommend any adjuvant chemotherapy-as the risk of recurrence is around 10%; and adjuvant chemotherapy will not meaningfully reduce any further recurrence. WILL check for PICK-3 Mutation to consider ASPRIN prophylaxes.   Referral to [facility] regarding-follow-up colonoscopy within a year/diagnosis of colon cancer  # Recommend surveillance colonoscopy in 1 year.  Continue follow-up with CT DNA testing per protocol; and also imaging in 6 months..  # Iron  deficiency anemia-secondary to #1/chronic GI bleed-patient on oral iron - HOLD  Venofer  today.   # DISPOSITION: # no venofer   # Referral to [facility] regarding-follow-up colonoscopy within a year/diagnosis of colon cancer # follow up in 6 months MD; labs- cbc/cmp; CEA; CT AP- Dr.B     Above plan of care was discussed with patient/family in detail.  My contact information was given to the patient/family.       Cindy JONELLE Joe, MD 01/07/2024 10:00 AM

## 2024-01-11 ENCOUNTER — Other Ambulatory Visit: Payer: Self-pay

## 2024-01-11 ENCOUNTER — Other Ambulatory Visit: Payer: Self-pay | Admitting: *Deleted

## 2024-01-11 DIAGNOSIS — I159 Secondary hypertension, unspecified: Secondary | ICD-10-CM

## 2024-01-11 NOTE — Patient Outreach (Signed)
 Complex Care Management   Visit Note  01/11/2024  Name:  Matthew Humphrey MRN: [id number] DOB: 09-Nov-1945  Situation: Referral received for Complex Care Management related to Heart Failure, SDOH Barriers:  Food insecurity, and HTN I obtained verbal consent from Patient.  Visit completed with Patient  on the phone  Background:   Past Medical History:  Diagnosis Date   Arthritis    knee   Chronic kidney disease    GERD (gastroesophageal reflux disease)    Hyperlipidemia    Hypertension     Assessment:  Outreach completed with Matthew Humphrey today. Noted that patient had Robotic assisted laparoscopic right colectomy on 11/21/23.  Patient informs me that surgical incisions are completely healed.   Discussed taking daily weights and monitoring blood pressure at home and logging readings.   Patient Reported Symptoms:  Cognitive Cognitive Status: Able to follow simple commands, Alert and oriented to person, place, and time, Insightful and able to interpret abstract concepts, Normal speech and language skills Cognitive/Intellectual Conditions Management [RPT]: None reported or documented in medical history or problem list   Health Maintenance Behaviors: Annual physical exam, Healthy diet, Sleep adequate, Stress management Healing Pattern: Average Health Facilitated by: Healthy diet, Pain control, Rest, Stress management  Neurological Neurological Review of Symptoms: Dizziness Neurological Management Strategies: Adequate rest, Routine screening Neurological Self-Management Outcome: 3 (uncertain) Neurological Comment: Reports occassional dizziness  HEENT HEENT Symptoms Reported: No symptoms reported HEENT Management Strategies: Adequate rest, Routine screening HEENT Self-Management Outcome: 4 (good)    Cardiovascular Other Cardiovascular Symptoms: Reports that he does not take his blood pressure at home.  Discussed taking Blood once a week and recording reading.  Reports that he would also take  his weight once a week and record. Does patient have uncontrolled Hypertension?: No Cardiovascular Management Strategies: Medication therapy, Coping strategies, Routine screening Cardiovascular Self-Management Outcome: 3 (uncertain)  Respiratory Respiratory Symptoms Reported: No symptoms reported Other Respiratory Symptoms: Denies shortness of breath Respiratory Management Strategies: Routine screening, Coping strategies  Endocrine Endocrine Symptoms Reported: No symptoms reported Is patient diabetic?: No Endocrine Self-Management Outcome: 4 (good)  Gastrointestinal Gastrointestinal Symptoms Reported: Constipation Additional Gastrointestinal Details: Reports occassional constipation.  Reports that he takes colace 2 times daily to assist with constipation. Gastrointestinal Management Strategies: Adequate rest Gastrointestinal Self-Management Outcome: 4 (good)    Genitourinary Genitourinary Symptoms Reported: No symptoms reported Genitourinary Management Strategies: Adequate rest Genitourinary Self-Management Outcome: 4 (good)  Integumentary Additional Integumentary Details: Reports that incision to left side is completely healed.  He informs me that he had mass removed from his colon August 2025. Skin Management Strategies: Routine screening, Coping strategies Skin Self-Management Outcome: 4 (good)  Musculoskeletal Musculoskelatal Symptoms Reviewed: Joint pain Additional Musculoskeletal Details: Reports that he has arthritis in his knees and legs bilaterally.   Reports that he takes Tylenola as needed. Musculoskeletal Management Strategies: Coping strategies, Routine screening Musculoskeletal Self-Management Outcome: 4 (good) Falls in the past year?: No Number of falls in past year: 1 or less Patient at Risk for Falls Due to: No Fall Risks Fall risk Follow up: Falls evaluation completed  Psychosocial Psychosocial Symptoms Reported: No symptoms reported Behavioral Management Strategies:  Support system Major Change/Loss/Stressor/Fears (CP): Denies Quality of Family Relationships: helpful, involved, supportive Do you feel physically threatened by others?: No    01/11/2024    PHQ2-9 Depression Screening   Little interest or pleasure in doing things Not at all  Feeling down, depressed, or hopeless Not at all  PHQ-2 - Total Score 0  Trouble  falling or staying asleep, or sleeping too much    Feeling tired or having little energy    Poor appetite or overeating     Feeling bad about yourself - or that you are a failure or have let yourself or your family down    Trouble concentrating on things, such as reading the newspaper or watching television    Moving or speaking so slowly that other people could have noticed.  Or the opposite - being so fidgety or restless that you have been moving around a lot more than usual    Thoughts that you would be better off dead, or hurting yourself in some way    PHQ2-9 Total Score    If you checked off any problems, how difficult have these problems made it for you to do your work, take care of things at home, or get along with other people    Depression Interventions/Treatment      There were no vitals filed for this visit.  Medications Reviewed Today     Reviewed by Jorja Nichole LABOR, RN (Case Manager) on 01/11/24 at 1055  Med List Status: <None>   Medication Order Taking? Sig Documenting Provider Last Dose Status Informant  acetaminophen  (TYLENOL ) 325 MG tablet [id number] Yes Take 2 tablets (650 mg total) by mouth every 4 (four) hours as needed for headache or mild pain (pain score 1-3). Fausto Sor A, DO  Active   amLODipine  (NORVASC ) 5 MG tablet [id number] Yes Take 5 mg by mouth daily. [provider]  Active Self  aspirin  EC 81 MG tablet [id number] Yes Take 1 tablet (81 mg total) by mouth daily. Joshua Cathryne BROCKS, MD  Active Self  atorvastatin  (LIPITOR ) 80 MG tablet [id number] Yes TAKE 1 TABLET BY MOUTH ONCE  DAILY Jones,  Deanna C, MD  Active Self  Baclofen  5 MG TABS [id number] Yes Take 1 tablet (5 mg total) by mouth 3 (three) times daily as needed. Alvia Selinda PARAS, MD  Active Self  docusate sodium (COLACE) 100 MG capsule [id number] Yes Take 100 mg by mouth 2 (two) times daily. [provider]  Active   ferrous sulfate  325 (65 FE) MG tablet [id number] Yes Take 1 tablet (325 mg total) by mouth every other day. Fausto Sor A, DO  Active   gabapentin  (NEURONTIN ) 100 MG capsule [id number] Yes Take 1 capsule (100 mg total) by mouth 3 (three) times daily as needed. Matthews, Jason J, MD  Active   isosorbide  mononitrate (IMDUR ) 30 MG 24 hr tablet [id number] Yes Take 30 mg by mouth daily. [provider]  Active Self  metoprolol  tartrate (LOPRESSOR ) 25 MG tablet [id number] Yes Take 0.5 tablets (12.5 mg total) by mouth 2 (two) times daily. Joshua Cathryne BROCKS, MD  Active Self  nitroGLYCERIN  (NITROSTAT ) 0.4 MG SL tablet [id number] Yes Place 1 tablet (0.4 mg total) under the tongue every 5 (five) minutes as needed for chest pain. Joshua Cathryne BROCKS, MD  Active Self  ondansetron  (ZOFRAN -ODT) 4 MG disintegrating tablet [id number] Yes Take 1 tablet (4 mg total) by mouth every 8 (eight) hours as needed for nausea or vomiting. Fausto Sor LABOR, DO  Active   oxyCODONE  (ROXICODONE ) 5 MG immediate release tablet [id number] Yes Take 1 tablet (5 mg total) by mouth every 6 (six) hours as needed for severe pain (pain score 7-10) or moderate pain (pain score 4-6). Matthews, Jason J, MD  Active   pantoprazole  (PROTONIX ) 40 MG tablet [id number] Yes TAKE 1 TABLET BY MOUTH DAILY Joshua,  Deanna C, MD  Active Self  simethicone  (MYLICON) 80 MG chewable tablet [id number]  Chew 1 tablet (80 mg total) by mouth 4 (four) times daily for 15 days.  Patient not taking: Reported on 01/11/2024   Fausto Burnard LABOR, DO  Expired 01/07/24 2359             Recommendation:   PCP Follow-up Specialty provider follow-up : Gastroenterology-01/15/24; PCP-  04/11/2024 Continue Current Plan of Care  Follow Up Plan:   Telephone follow-up : 2 weeks- 01/31/24 @ 11 am  Brionne Mertz, RN, Scientist, research (physical sciences), Theatre manager [employer] [phone]

## 2024-01-11 NOTE — Patient Instructions (Signed)
 Visit Information  Thank you for taking time to visit with me today. Please don't hesitate to contact me if I can be of assistance to you before our next scheduled appointment.  Our next appointment is by telephone on 01/31/24 at 1100 am Please call the care guide team at [phone] if you need to cancel or reschedule your appointment.   Social Worker appt- 01/17/24 @ 10 am  Following is a copy of your care plan:   Goals Addressed             This Visit's Progress    VBCI RN Care Plan-CHF/HTN       Problems:  Care Coordination needs related to Food Insecurity  Chronic Disease Management support and education needs related to CHF and HTN  Goal: Over the next 90 days the Patient will attend all scheduled medical appointments: PCP and Specialist as evidenced by keeping all scheduled appointments.        continue to work with Medical illustrator and/or Social Worker to address care management and care coordination needs related to CHF and HTN as evidenced by adherence to care management team scheduled appointments     take all medications exactly as prescribed and will call provider for medication related questions as evidenced by compliance.    verbalize basic understanding of CHF and HTN disease process and self health management plan as evidenced by verbal explanation, recognize, monitor symptoms and lifestyle changes.  work with Child psychotherapist to address Limited access to food related to the management of CHF and HTN as evidenced by review of electronic medical record and patient or social worker report      Interventions:   Heart Failure Interventions: Basic overview and discussion of pathophysiology of Heart Failure reviewed Provided education on low sodium diet Assessed need for readable accurate scales in home Provided education about placing scale on hard, flat surface Advised patient to weigh each morning after emptying bladder Discussed importance of daily weight and advised  patient to weigh and record daily Reviewed role of diuretics in prevention of fluid overload and management of heart failure; Discussed the importance of keeping all appointments with provider Screening for signs and symptoms of depression related to chronic disease state  Assessed social determinant of health barriers   Hypertension Interventions: Last practice recorded BP readings:  BP Readings from Last 3 Encounters:  01/07/24 130/76  01/04/24 138/82  12/13/23 (!) 124/54   Most recent eGFR/CrCl:  Lab Results  Component Value Date   EGFR 62 01/04/2024    No components found for: CRCL  Evaluation of current treatment plan related to hypertension self management and patient's adherence to plan as established by provider Provided education to patient re: stroke prevention, s/s of heart attack and stroke Reviewed medications with patient and discussed importance of compliance Discussed plans with patient for ongoing care management follow up and provided patient with direct contact information for care management team Advised patient, providing education and rationale, to monitor blood pressure daily and record, calling PCP for findings outside established parameters Provided education on prescribed diet Dash diet Discussed complications of poorly controlled blood pressure such as heart disease, stroke, circulatory complications, vision complications, kidney impairment, sexual dysfunction Screening for signs and symptoms of depression related to chronic disease state  Assessed social determinant of health barriers  Patient Self-Care Activities:  Attend all scheduled provider appointments Attend church or other social activities Call pharmacy for medication refills 3-7 days in advance of running out of medications  Call provider office for new concerns or questions  Take medications as prescribed   Work with the social worker to address care coordination needs and will continue to  work with the clinical team to address health care and disease management related needs call office if I gain more than 2 pounds in one day or 5 pounds in one week track weight in diary use salt in moderation watch for swelling in feet, ankles and legs every day weigh myself daily eat more whole grains, fruits and vegetables, lean meats and healthy fats track symptoms and what helps feel better or worse check blood pressure weekly write blood pressure results in a log or diary learn about high blood pressure keep a blood pressure log take blood pressure log to all doctor appointments call doctor for signs and symptoms of high blood pressure keep all doctor appointments take medications for blood pressure exactly as prescribed report new symptoms to your doctor eat more whole grains, fruits and vegetables, lean meats and healthy fats  Plan:  Telephone follow up appointment with care management team member scheduled for:  01/31/24 @ 11 am             Please call the Suicide and Crisis Lifeline: 988 call the [place]  National Suicide Prevention Lifeline: 306-730-8558 or TTY: 878-409-7006 TTY 810 267 3982) to talk to a trained counselor call 1-800-273-TALK (toll free, 24 hour hotline) go to [facility] [address] [place] [phone]) call the [facility] Crisis Line: [phone] call 911 if you are experiencing a Mental Health or Behavioral Health Crisis or need someone to talk to.  Patient verbalizes understanding of instructions and care plan provided today and agrees to view in MyChart. Active MyChart status and patient understanding of how to access instructions and care plan via MyChart confirmed with patient.     Kehaulani Fruin, RN, BSN, Theatre manager [employer] [phone]

## 2024-01-14 ENCOUNTER — Encounter: Payer: Self-pay | Admitting: Internal Medicine

## 2024-01-15 ENCOUNTER — Telehealth: Payer: Self-pay | Admitting: *Deleted

## 2024-01-15 NOTE — Progress Notes (Signed)
 Complex Care Management Note Care Guide Note  01/15/2024 Name: Matthew Humphrey MRN: [id number] DOB: 07-Nov-1945   Complex Care Management Outreach Attempts: An unsuccessful telephone outreach was attempted today to offer the patient information about available complex care management services.  Follow Up Plan:  Additional outreach attempts will be made to offer the patient complex care management information and services.   Encounter Outcome:  No Answer  Asencion Randee [facility] HealthPopulation Health Care Guide  Direct Dial:[phone] Fax:[phone] Website: [email or web address]

## 2024-01-16 LAB — SIGNATERA
SIGNATERA MTM READOUT: 0 MTM/ml
SIGNATERA TEST RESULT: NEGATIVE

## 2024-01-17 ENCOUNTER — Other Ambulatory Visit: Payer: Self-pay

## 2024-01-17 NOTE — Patient Outreach (Signed)
 Complex Care Management   Visit Note  01/17/2024  Name:  Matthew Humphrey MRN: [id number] DOB: [birth year]  Situation: Referral received for Complex Care Management related to SDOH Barriers:  Food insecurity Lack of essential utilities [place] I obtained verbal consent from Patient.  Visit completed with Patient  on the phone  Background:   Past Medical History:  Diagnosis Date   Arthritis    knee   Chronic kidney disease    GERD (gastroesophageal reflux disease)    Hyperlipidemia    Hypertension     Assessment: SW completed a telephone outreach with patient, he states he does not receive foodstamps and has not applied. Patient does receive social security and works part time. SW and patient agreed for resources for food and utilities to be sent.  SDOH Interventions    Flowsheet Row Patient Outreach Telephone from 01/17/2024 in [place] POPULATION HEALTH DEPARTMENT Most recent reading at 01/17/2024 10:38 AM Patient Outreach Telephone from 01/11/2024 in [place] POPULATION HEALTH DEPARTMENT Most recent reading at 01/11/2024 10:57 AM Telephone from 11/26/2023 in [place] POPULATION HEALTH DEPARTMENT Most recent reading at 11/26/2023  2:30 PM ED to Hosp-Admission (Discharged) from 11/16/2023 in [facility] Most recent reading at 11/19/2023 12:39 PM Office Visit from 11/16/2023 in [facility] & [facility] Medicine at [facility] Most recent reading at 11/16/2023  8:58 AM Clinical Support from 02/01/2022 in [facility] at [facility] Most recent reading at 02/01/2022  8:33 AM  SDOH Interventions        Food Insecurity Interventions Community Resources Provided Other (Comment)  [BSW referral for food resources.] Intervention Not Indicated -- Intervention Not Indicated Intervention Not Indicated  Housing Interventions -- Intervention Not Indicated Intervention Not Indicated Intervention Not Indicated  Intervention Not Indicated Intervention Not Indicated  Transportation Interventions -- Intervention Not Indicated Intervention Not Indicated  [drives self at baseline] -- Intervention Not Indicated Intervention Not Indicated  Utilities Interventions -- Intervention Not Indicated Intervention Not Indicated -- Intervention Not Indicated Intervention Not Indicated  Alcohol Usage Interventions -- -- -- -- -- Intervention Not Indicated (Score <7)  Financial Strain Interventions -- -- -- -- -- Intervention Not Indicated  Stress Interventions -- -- -- -- -- Intervention Not Indicated  Social Connections Interventions -- -- -- Inpatient TOC  [Resources added to AVS.] -- Intervention Not Indicated    Recommendation:   No recommendations at this time  Follow Up Plan:   Telephone follow-up 02/01/24 at 10am  Thersia Hoar, BSW, MHA [place]  [facility] Social Worker, Population Health [phone]

## 2024-01-17 NOTE — Patient Instructions (Signed)
 Visit Information  Thank you for taking time to visit with me today. Please don't hesitate to contact me if I can be of assistance to you before our next scheduled appointment.  Our next appointment is by telephone on 02/01/24 at 10am Please call the care guide team at [phone] if you need to cancel or reschedule your appointment.   Following is a copy of your care plan:   Goals Addressed             This Visit's Progress    BSW VBCI Social Work Care Plan       Problems:   Food Insecurity  and utilities  CSW Clinical Goal(s):   Over the next 30 days the Patient will work with Child psychotherapist to address concerns related to food and utility assistance.  Interventions:  SW mailed resources for food pantries and utility resources to address on file.  Patient Goals/Self-Care Activities:  Patient will use resources sent.  Plan:   The care management team will reach out to the patient again over the next 15 days.        Please call the Suicide and Crisis Lifeline: 988 call the [place]  National Suicide Prevention Lifeline: 847 428 9783 or TTY: 919-391-4398 TTY (438)863-0968) to talk to a trained counselor call 1-800-273-TALK (toll free, 24 hour hotline) call 911 if you are experiencing a Mental Health or Behavioral Health Crisis or need someone to talk to.  Patient verbalizes understanding of instructions and care plan provided today and agrees to view in MyChart. Active MyChart status and patient understanding of how to access instructions and care plan via MyChart confirmed with patient.     Thersia Hoar, HEDWIG, MHA [place]  [facility] Social Worker, Population Health [phone]

## 2024-01-18 DIAGNOSIS — Z860101 Personal history of adenomatous and serrated colon polyps: Secondary | ICD-10-CM | POA: Diagnosis not present

## 2024-01-18 DIAGNOSIS — C182 Malignant neoplasm of ascending colon: Secondary | ICD-10-CM | POA: Diagnosis not present

## 2024-01-18 DIAGNOSIS — D500 Iron deficiency anemia secondary to blood loss (chronic): Secondary | ICD-10-CM | POA: Diagnosis not present

## 2024-01-21 ENCOUNTER — Telehealth: Payer: Self-pay

## 2024-01-21 NOTE — Telephone Encounter (Signed)
 Called [facility] to follow up on PIK3CA that was requested on 01/07/24. Specimen was sent for testing on 10/16 with a 5+ day TAT.

## 2024-01-22 ENCOUNTER — Telehealth: Payer: Self-pay

## 2024-01-22 NOTE — Telephone Encounter (Signed)
 Spoke with Jeanie at [facility], pt has appt sch'd with Kyle 07/27/24.

## 2024-01-23 ENCOUNTER — Ambulatory Visit
Admission: RE | Admit: 2024-01-23 | Discharge: 2024-01-23 | Disposition: A | Discharge status: Home / Self Care | Source: Ambulatory Visit | Attending: Family Medicine | Admitting: Family Medicine

## 2024-01-23 DIAGNOSIS — M48061 Spinal stenosis, lumbar region without neurogenic claudication: Secondary | ICD-10-CM | POA: Diagnosis not present

## 2024-01-23 DIAGNOSIS — M4726 Other spondylosis with radiculopathy, lumbar region: Secondary | ICD-10-CM | POA: Insufficient documentation

## 2024-01-23 DIAGNOSIS — D1809 Hemangioma of other sites: Secondary | ICD-10-CM | POA: Diagnosis not present

## 2024-01-23 DIAGNOSIS — R609 Edema, unspecified: Secondary | ICD-10-CM | POA: Diagnosis not present

## 2024-01-28 ENCOUNTER — Ambulatory Visit: Payer: Self-pay | Admitting: Family Medicine

## 2024-01-28 ENCOUNTER — Telehealth: Payer: Self-pay

## 2024-01-28 DIAGNOSIS — M4726 Other spondylosis with radiculopathy, lumbar region: Secondary | ICD-10-CM

## 2024-01-28 NOTE — Progress Notes (Signed)
 Tried to call patient VM is full. E2C2 able to give results if Patient calls back.

## 2024-01-28 NOTE — Telephone Encounter (Signed)
 Call placed to [facility] regarding results for PIK3CA. They state they have received the results and are scanning them into the system.

## 2024-01-28 NOTE — Telephone Encounter (Signed)
 Order for neurosurgeon placed.  JM    Copied from CRM [id number]. Topic: Clinical - Lab/Test Results >> Jan 28, 2024 10:58 AM Matthew Humphrey wrote: Reason for CRM:  I read him is MRI results and there was this question in the notes Ask about his current symptoms (in general): - If severe, place referral to neurosurgery  - If problematic but not severe, place referral to interventional pain and spine His current symptom is severe so please refer him to a neurosurgery.  He wants to go to: [facility] Neurosurgery at [facility] [address], [place], [place] [address] Phone: [phone] He does not any questions at this time.  Thanks

## 2024-01-29 NOTE — Progress Notes (Signed)
 Called patient, no answer, VM full. JM

## 2024-01-30 NOTE — Progress Notes (Signed)
 Spoke with patient and he has appt with Neurosurgeon on 02/21/24.

## 2024-01-31 ENCOUNTER — Other Ambulatory Visit: Payer: Self-pay | Admitting: Family Medicine

## 2024-01-31 ENCOUNTER — Other Ambulatory Visit: Payer: Self-pay | Admitting: *Deleted

## 2024-01-31 ENCOUNTER — Other Ambulatory Visit: Payer: Self-pay

## 2024-01-31 ENCOUNTER — Telehealth: Payer: Self-pay | Admitting: *Deleted

## 2024-01-31 DIAGNOSIS — N1831 Chronic kidney disease, stage 3a: Secondary | ICD-10-CM

## 2024-01-31 DIAGNOSIS — D62 Acute posthemorrhagic anemia: Secondary | ICD-10-CM

## 2024-01-31 MED ORDER — FERROUS SULFATE 325 (65 FE) MG PO TABS: 325.0000 mg | ORAL_TABLET | ORAL | 2 refills | Status: DC

## 2024-01-31 NOTE — Patient Outreach (Signed)
 Complex Care Management   Visit Note  01/31/2024  Name:  Matthew Humphrey MRN: [id number] DOB: 1945/08/05  Situation: Referral received for Complex Care Management related to SDOH Barriers:  Food insecurity and CHF and HTN I obtained verbal consent from Patient.  Visit completed with Patient  on the phone  Background:   Past Medical History:  Diagnosis Date   Arthritis    knee   Chronic kidney disease    GERD (gastroesophageal reflux disease)    Hyperlipidemia    Hypertension     Assessment: Patient Reported Symptoms:  Cognitive Cognitive Status: Able to follow simple commands, Alert and oriented to person, place, and time, Normal speech and language skills, Insightful and able to interpret abstract concepts Cognitive/Intellectual Conditions Management [RPT]: None reported or documented in medical history or problem list   Health Maintenance Behaviors: Annual physical exam, Healthy diet, Sleep adequate, Stress management Healing Pattern: Average Health Facilitated by: Healthy diet, Pain control, Rest, Stress management  Neurological Neurological Review of Symptoms: No symptoms reported Neurological Management Strategies: Adequate rest, Routine screening Neurological Comment: Reports that dizziness has resolved.  HEENT HEENT Symptoms Reported: No symptoms reported HEENT Management Strategies: Adequate rest, Routine screening HEENT Self-Management Outcome: 4 (good)    Cardiovascular Cardiovascular Symptoms Reported: No symptoms reported Other Cardiovascular Symptoms: Reports that he takes blood pressure at home every other day.  He reports that Blood Pressure was 135/80 on 01/30/24 Does patient have uncontrolled Hypertension?: No Cardiovascular Management Strategies: Medication therapy, Coping strategies, Routine screening Cardiovascular Self-Management Outcome: 4 (good)  Respiratory Respiratory Symptoms Reported: No symptoms reported Respiratory Management Strategies:  Routine screening, Coping strategies  Endocrine Endocrine Symptoms Reported: No symptoms reported Is patient diabetic?: No Endocrine Self-Management Outcome: 4 (good)  Gastrointestinal Gastrointestinal Symptoms Reported: No symptoms reported Gastrointestinal Management Strategies: Adequate rest Gastrointestinal Self-Management Outcome: 4 (good)    Genitourinary Genitourinary Symptoms Reported: No symptoms reported Genitourinary Management Strategies: Adequate rest Genitourinary Self-Management Outcome: 4 (good)  Integumentary Integumentary Symptoms Reported: No symptoms reported Skin Management Strategies: Adequate rest, Routine screening Skin Self-Management Outcome: 4 (good)  Musculoskeletal Musculoskelatal Symptoms Reviewed: Back pain, Muscle pain Additional Musculoskeletal Details: Reports that he has arthritis in knees and legs bilaterally and in his spine.  He informs me that he also has a pinched nerve in his spine area.  He reports that he has he has an upcoming appointment with Neuro Surgeon on November 20th 2025.  He reports that he is taking Oxycodone , Tylenol  and Neurotin to help with discomfort.  Reports pain level a 0/10 on pain scale today. Musculoskeletal Management Strategies: Adequate rest, Routine screening Musculoskeletal Self-Management Outcome: 4 (good) Falls in the past year?: No Number of falls in past year: 1 or less Patient at Risk for Falls Due to: No Fall Risks Fall risk Follow up: Falls evaluation completed  Psychosocial Psychosocial Symptoms Reported: No symptoms reported Behavioral Management Strategies: Support system Behavioral Health Self-Management Outcome: 4 (good) Major Change/Loss/Stressor/Fears (CP): Denies Quality of Family Relationships: helpful, involved, supportive Do you feel physically threatened by others?: No    01/31/2024    PHQ2-9 Depression Screening   Little interest or pleasure in doing things Not at all  Feeling down, depressed, or  hopeless Not at all  PHQ-2 - Total Score 0  Trouble falling or staying asleep, or sleeping too much    Feeling tired or having little energy    Poor appetite or overeating     Feeling bad about yourself - or that you are a  failure or have let yourself or your family down    Trouble concentrating on things, such as reading the newspaper or watching television    Moving or speaking so slowly that other people could have noticed.  Or the opposite - being so fidgety or restless that you have been moving around a lot more than usual    Thoughts that you would be better off dead, or hurting yourself in some way    PHQ2-9 Total Score    If you checked off any problems, how difficult have these problems made it for you to do your work, take care of things at home, or get along with other people    Depression Interventions/Treatment      Vitals:   01/30/24 0951  BP: 135/80    Medications Reviewed Today     Reviewed by Jorja Nichole LABOR, RN (Case Manager) on 01/31/24 at [phone]  Med List Status: <None>   Medication Order Taking? Sig Documenting Provider Last Dose Status Informant  acetaminophen  (TYLENOL ) 325 MG tablet [id number] Yes Take 2 tablets (650 mg total) by mouth every 4 (four) hours as needed for headache or mild pain (pain score 1-3). Fausto Sor A, DO  Active   amLODipine  (NORVASC ) 5 MG tablet [id number] Yes Take 5 mg by mouth daily. [provider]  Active Self  aspirin  EC 81 MG tablet [id number] Yes Take 1 tablet (81 mg total) by mouth daily. Joshua Cathryne BROCKS, MD  Active Self  atorvastatin  (LIPITOR ) 80 MG tablet [id number] Yes TAKE 1 TABLET BY MOUTH ONCE  DAILY Jones, Deanna C, MD  Active Self  Baclofen  5 MG TABS [id number] Yes Take 1 tablet (5 mg total) by mouth 3 (three) times daily as needed. Alvia Selinda PARAS, MD  Active Self  docusate sodium (COLACE) 100 MG capsule [id number] Yes Take 100 mg by mouth 2 (two) times daily. [provider]  Active   ferrous sulfate  325  (65 FE) MG tablet [id number] Yes Take 1 tablet (325 mg total) by mouth every other day. Fausto Sor LABOR, DO  Active   gabapentin  (NEURONTIN ) 100 MG capsule [id number] Yes Take 1 capsule (100 mg total) by mouth 3 (three) times daily as needed. Matthews, Jason J, MD  Active   isosorbide  mononitrate (IMDUR ) 30 MG 24 hr tablet [id number] Yes Take 30 mg by mouth daily. [provider]  Active Self  metoprolol  tartrate (LOPRESSOR ) 25 MG tablet [id number] Yes Take 0.5 tablets (12.5 mg total) by mouth 2 (two) times daily. Joshua Cathryne BROCKS, MD  Active Self  nitroGLYCERIN  (NITROSTAT ) 0.4 MG SL tablet [id number] Yes Place 1 tablet (0.4 mg total) under the tongue every 5 (five) minutes as needed for chest pain. Joshua Cathryne BROCKS, MD  Active Self  ondansetron  (ZOFRAN -ODT) 4 MG disintegrating tablet [id number] Yes Take 1 tablet (4 mg total) by mouth every 8 (eight) hours as needed for nausea or vomiting. Fausto Sor A, DO  Active   oxyCODONE  (ROXICODONE ) 5 MG immediate release tablet [id number] Yes Take 1 tablet (5 mg total) by mouth every 6 (six) hours as needed for severe pain (pain score 7-10) or moderate pain (pain score 4-6). Matthews, Jason J, MD  Active   pantoprazole  (PROTONIX ) 40 MG tablet [id number] Yes TAKE 1 TABLET BY MOUTH DAILY Jones, Deanna C, MD  Active Self  simethicone  (MYLICON) 80 MG chewable tablet [id number]  Chew 1 tablet (80 mg total) by mouth 4 (four) times daily for 15 days.  Patient not taking: Reported on  01/31/2024   Fausto Burnard LABOR, DO  Expired 01/07/24 2359             Recommendation:   PCP Follow-up Specialty provider follow-up : Neurosurgery 02/21/24 Continue Current Plan of Care  Follow Up Plan:   Telephone follow-up in 1 month: 03/05/24 @ 10 am.  Daquon Greenleaf, RN, BSN, ACM RN Care Manager [employer] [phone]

## 2024-01-31 NOTE — Patient Instructions (Signed)
 Visit Information  Thank you for taking time to visit with me today. Please don't hesitate to contact me if I can be of assistance to you before our next scheduled appointment.  Your next care management appointment is by telephone on 03/05/24  at 10 am  Please call the care guide team at [phone] if you need to cancel, schedule, or reschedule an appointment.   Please call the Suicide and Crisis Lifeline: 988 call the [place]  National Suicide Prevention Lifeline: 206-132-3625 or TTY: 819-133-8872 TTY (249) 117-8125) to talk to a trained counselor call 1-800-273-TALK (toll free, 24 hour hotline) go to [facility] [address] [place] [phone]) call the [facility] Line: [phone] if you are experiencing a Mental Health or Behavioral Health Crisis or need someone to talk to.  Shray Hunley, RN, BSN, Theatre Manager [employer] [phone]

## 2024-02-01 ENCOUNTER — Telehealth: Payer: Self-pay | Admitting: *Deleted

## 2024-02-01 ENCOUNTER — Other Ambulatory Visit: Payer: Self-pay

## 2024-02-01 NOTE — Patient Outreach (Signed)
 Social Drivers of Health  Community Resource and Care Coordination Visit Note   02/01/2024  Name: Matthew Humphrey MRN: [id number] DOB:[birth year]  Situation: Referral received for [facility] needs assessment and assistance related to Food Insecurity  uttilties. I obtained verbal consent from Patient.  Visit completed with Patient on the phone.   Background:      Assessment:   Goals Addressed             This Visit's Progress    BSW VBCI Social Work Care Plan       Problems:   Food Insecurity  and utilities  CSW Clinical Goal(s):   Over the next 30 days the Patient will work with Child Psychotherapist to address concerns related to food and utility assistance.  Interventions:  SW mailed resources for food pantries and utility resources to address on file.  Patient Goals/Self-Care Activities:  Patient will use resources sent.  Plan:   The care management team will reach out to the patient again over the next 30 days.        Recommendation:   call and/or follow up with DSS to apply for foodstamps and food pantries for food assistance Use utility resources for assistance  Follow Up Plan:   Telephone follow-up in 1 month  Thersia Hoar, [place], [place] [facility]  [facility] Social Worker, Population Health [phone]

## 2024-02-01 NOTE — Patient Instructions (Signed)
 Visit Information  Thank you for taking time to visit with me today. Please don't hesitate to contact me if I can be of assistance to you before our next scheduled appointment.  Your next care management appointment is by telephone on 03/03/24 at 11am    Please call the care guide team at [phone] if you need to cancel, schedule, or reschedule an appointment.   Please call the Suicide and Crisis Lifeline: 988 call the [place]  National Suicide Prevention Lifeline: 210 028 5142 or TTY: 718-028-5323 TTY (612)044-4499) to talk to a trained counselor call 1-800-273-TALK (toll free, 24 hour hotline) call 911 if you are experiencing a Mental Health or Behavioral Health Crisis or need someone to talk to.  Thersia Hoar, HEDWIG, MHA [place]  [facility] Social Worker, Population Health [phone]

## 2024-02-01 NOTE — Progress Notes (Signed)
 Complex Care Management Note Care Guide Note  02/01/2024 Name: [name] MRN: [id number] DOB: 12/16/1945  [name] is a 78 y.o. year old male who is a primary care patient of Kotturi, Vinay K, MD . The community resource team was consulted for assistance with Food Insecurity  SDOH screenings and interventions completed:  Yes     SDOH Interventions Today    Flowsheet Row Most Recent Value  SDOH Interventions   Food Insecurity Interventions Community Resources Provided, [employer] Referral  [Provided food banks as patient has transportation to places]  Housing Interventions Intervention Not Indicated  [Patient said housing was good presently]     Care guide performed the following interventions: Patient provided with information about care guide support team and interviewed to confirm resource needs.  Follow Up Plan:  No further follow up planned at this time. The patient has been provided with needed resources.  Encounter Outcome:  Patient Visit Completed  Nadeen Shipman Greenauer-Moran  [facility] HealthPopulation Health Care Guide  Direct Dial:[phone] Fax:[phone] Website: [email or web address]

## 2024-02-03 ENCOUNTER — Ambulatory Visit: Payer: Self-pay | Admitting: Internal Medicine

## 2024-02-04 ENCOUNTER — Other Ambulatory Visit: Payer: Self-pay | Admitting: *Deleted

## 2024-02-04 DIAGNOSIS — C182 Malignant neoplasm of ascending colon: Secondary | ICD-10-CM

## 2024-02-06 ENCOUNTER — Inpatient Hospital Stay: Attending: Internal Medicine

## 2024-02-06 DIAGNOSIS — C182 Malignant neoplasm of ascending colon: Secondary | ICD-10-CM

## 2024-02-06 LAB — GENETIC SCREENING ORDER

## 2024-02-14 ENCOUNTER — Ambulatory Visit (INDEPENDENT_AMBULATORY_CARE_PROVIDER_SITE_OTHER): Admitting: Emergency Medicine

## 2024-02-14 VITALS — Ht 66.0 in | Wt 178.0 lb

## 2024-02-14 DIAGNOSIS — Z0000 Encounter for general adult medical examination without abnormal findings: Secondary | ICD-10-CM

## 2024-02-14 NOTE — Progress Notes (Cosign Needed Addendum)
 Chief Complaint  Patient presents with   Medicare Wellness     Subjective:   Matthew Humphrey is a 78 y.o. male who presents for a Medicare Annual Wellness Visit.  Allergies (verified) Sulfa antibiotics, Sulfamethoxazole-trimethoprim, and Sulfasalazine   History: Past Medical History:  Diagnosis Date   Arthritis    knee   Chronic kidney disease    Colon cancer (HCC)    GERD (gastroesophageal reflux disease)    Hyperlipidemia    Hypertension    Past Surgical History:  Procedure Laterality Date   CARDIAC SURGERY  03/2010   bypass   CERVICAL FUSION  12/11/2012   C4-5,C5-6,C6-7   COLONOSCOPY  2011 ?   COLONOSCOPY  11/18/2023   Procedure: COLONOSCOPY;  Surgeon: Onita Elspeth Sharper, DO;  Location: [facility] ENDOSCOPY;  Service: Gastroenterology;;   COLONOSCOPY WITH PROPOFOL  N/A 07/11/2017   Procedure: COLONOSCOPY WITH PROPOFOL ;  Surgeon: Unk Corinn Skiff, MD;  Location: [facility];  Service: Endoscopy;  Laterality: N/A;   ESOPHAGOGASTRODUODENOSCOPY N/A 11/18/2023   Procedure: EGD (ESOPHAGOGASTRODUODENOSCOPY);  Surgeon: Onita Elspeth Sharper, DO;  Location: [facility] END[facility]COPY;  Service: Gastroenterology;  Laterality: N/A;   POLYPECTOMY N/A 07/11/2017   Procedure: POLYPECTOMY;  Surgeon: Unk Corinn Skiff, MD;  Location: [facility];  Service: Endoscopy;  Laterality: N/A;   Family History  Problem Relation Age of Onset   Hypertension Father    Hypotension Mother    Social History   Occupational History   Occupation: Retired  Tobacco Use   Smoking status: Former    Current packs/day: 0.00    Average packs/day: 1.5 packs/day for 30.0 years (45.0 ttl pk-yrs)    Types: Cigarettes    Start date: 70    Quit date: 1997    Years since quitting: 28.8   Smokeless tobacco: Former    Types: Chew    Quit date: 1985   Tobacco comments:    smoking cessation materials not required  Vaping Use   Vaping status: Never Used  Substance and Sexual Activity    Alcohol use: No    Alcohol/week: 0.0 standard drinks of alcohol   Drug use: No   Sexual activity: Not Currently   Tobacco Counseling Counseling given: Not Answered Tobacco comments: smoking cessation materials not required  SDOH Screenings   Food Insecurity: Food Insecurity Present (02/14/2024)  Housing: Low Risk  (02/14/2024)  Recent Concern: Housing - High Risk (01/18/2024)   Received from [facility]  Transportation Needs: No Transportation Needs (02/14/2024)  Utilities: Not At Risk (02/14/2024)  Alcohol Screen: Low Risk  (02/01/2022)  Depression (PHQ2-9): Low Risk  (02/14/2024)  Financial Resource Strain: High Risk (01/18/2024)   Received from [facility]  Physical Activity: Insufficiently Active (02/14/2024)  Social Connections: Socially Isolated (02/14/2024)  Stress: No Stress Concern Present (02/14/2024)  Tobacco Use: Medium Risk (02/14/2024)  Health Literacy: Adequate Health Literacy (02/14/2024)   See flowsheets for full screening details  Depression Screen PHQ 2 & 9 Depression Scale- Over the past 2 weeks, how often have you been bothered by any of the following problems? Little interest or pleasure in doing things: 0 Feeling down, depressed, or hopeless (PHQ Adolescent also includes...irritable): 0 PHQ-2 Total Score: 0 Trouble falling or staying asleep, or sleeping too much: 0 Feeling tired or having little energy: 0 Poor appetite or overeating (PHQ Adolescent also includes...weight loss): 0 Feeling bad about yourself - or that you are a failure or have let yourself or your family down: 0 Trouble concentrating  on things, such as reading the newspaper or watching television [facility] Adolescent also includes...like school work): 0 Moving or speaking so slowly that other people could have noticed. Or the opposite - being so fidgety or restless that you have been moving around a lot more than usual: 0 Thoughts that you would be better off  dead, or of hurting yourself in some way: 0 PHQ-9 Total Score: 0 If you checked off any problems, how difficult have these problems made it for you to do your work, take care of things at home, or get along with other people?: Not difficult at all  Depression Treatment Depression Interventions/Treatment : EYV7-0 Score <4 Follow-up Not Indicated     Goals Addressed               This Visit's Progress     Get through having colon cancer (pt-stated)         Visit info / Clinical Intake: Medicare Wellness Visit Type:: Subsequent Annual Wellness Visit Persons participating in visit:: patient Medicare Wellness Visit Mode:: Telephone If telephone:: video declined Because this visit was a virtual/telehealth visit:: pt reported vitals If Telephone or Video please confirm:: I connected with the patient using audio enabled telemedicine application and verified that I am speaking with the correct person using two identifiers; I discussed the limitations of evaluation and management by telemedicine; The patient expressed understanding and agreed to proceed Patient Location:: home Provider Location:: home Information given by:: patient Interpreter Needed?: No Pre-visit prep was completed: yes AWV questionnaire completed by patient prior to visit?: no Living arrangements:: (!) lives alone Patient's Overall Health Status Rating: very good Typical amount of pain: some Does pain affect daily life?: no Are you currently prescribed opioids?: no  Dietary Habits and Nutritional Risks How many meals a day?: 2 Eats fruit and vegetables daily?: yes Most meals are obtained by: preparing own meals In the last 2 weeks, have you had any of the following?: (!) nausea, vomiting, diarrhea (a little diarrhea) Diabetic:: no  Functional Status Activities of Daily Living (to include ambulation/medication): Independent Ambulation: Independent with device- listed below Home Assistive Devices/Equipment:  Cane; Eyeglasses (uses cane prn) Medication Administration: Independent Home Management: Independent Manage your own finances?: yes Primary transportation is: driving Concerns about vision?: no *vision screening is required for WTM* Concerns about hearing?: (!) yes Uses hearing aids?: no Hear whispered voice?: yes  Fall Screening Falls in the past year?: 0 Number of falls in past year: 0 Was there an injury with Fall?: 0 Fall Risk Category Calculator: 0 Patient Fall Risk Level: Low Fall Risk  Fall Risk Patient at Risk for Falls Due to: Impaired balance/gait; Impaired mobility (uses a cane prn) Fall risk Follow up: Falls evaluation completed; Education provided  Home and Transportation Safety: All rugs have non-skid backing?: yes All stairs or steps have railings?: yes Grab bars in the bathtub or shower?: yes Have non-skid surface in bathtub or shower?: yes Good home lighting?: yes Regular seat belt use?: yes Hospital stays in the last year:: (!) yes How many hospital stays:: 1 Reason: surgery for colon cancer  Cognitive Assessment Difficulty concentrating, remembering, or making decisions? : no Will 6CIT or Mini Cog be Completed: yes What year is it?: 0 points What month is it?: 0 points Give patient an address phrase to remember (5 components): [address] [place] About what time is it?: 0 points Count backwards from 20 to 1: 0 points Say the months of the year in reverse:  0 points Repeat the address phrase from earlier: 2 points 6 CIT Score: 2 points  Advance Directives (For Healthcare) Does Patient Have a Medical Advance Directive?: No Would patient like information on creating a medical advance directive?: Yes (MAU/Ambulatory/Procedural Areas - Information given)  Reviewed/Updated  Reviewed/Updated: Reviewed All (Medical, Surgical, Family, Medications, Allergies, Care Teams, Patient Goals)        Objective:    Today's Vitals   02/14/24 1001  Weight:  178 lb (80.7 kg)  Height: 5' 6 (1.676 m)   Body mass index is 28.73 kg/m.  Current Medications (verified) Outpatient Encounter Medications as of 02/14/2024  Medication Sig   acetaminophen  (TYLENOL ) 325 MG tablet Take 2 tablets (650 mg total) by mouth every 4 (four) hours as needed for headache or mild pain (pain score 1-3).   amLODipine  (NORVASC ) 5 MG tablet Take 5 mg by mouth daily.   aspirin  EC 81 MG tablet Take 1 tablet (81 mg total) by mouth daily.   atorvastatin  (LIPITOR ) 80 MG tablet TAKE 1 TABLET BY MOUTH ONCE  DAILY   docusate sodium (COLACE) 100 MG capsule Take 100 mg by mouth 2 (two) times daily. (Patient taking differently: Take 100 mg by mouth 2 (two) times daily. Taking 1 tablet daily)   isosorbide  mononitrate (IMDUR ) 30 MG 24 hr tablet Take 30 mg by mouth daily.   metoprolol  tartrate (LOPRESSOR ) 25 MG tablet Take 0.5 tablets (12.5 mg total) by mouth 2 (two) times daily.   nitroGLYCERIN  (NITROSTAT ) 0.4 MG SL tablet Place 1 tablet (0.4 mg total) under the tongue every 5 (five) minutes as needed for chest pain.   ondansetron  (ZOFRAN -ODT) 4 MG disintegrating tablet Take 1 tablet (4 mg total) by mouth every 8 (eight) hours as needed for nausea or vomiting.   pantoprazole  (PROTONIX ) 40 MG tablet TAKE 1 TABLET BY MOUTH DAILY   Baclofen  5 MG TABS Take 1 tablet (5 mg total) by mouth 3 (three) times daily as needed. (Patient not taking: Reported on 02/14/2024)   ferrous sulfate  325 (65 FE) MG tablet Take 1 tablet (325 mg total) by mouth every other day. (Patient not taking: Reported on 02/14/2024)   gabapentin  (NEURONTIN ) 100 MG capsule Take 1 capsule (100 mg total) by mouth 3 (three) times daily as needed. (Patient not taking: Reported on 02/14/2024)   oxyCODONE  (ROXICODONE ) 5 MG immediate release tablet Take 1 tablet (5 mg total) by mouth every 6 (six) hours as needed for severe pain (pain score 7-10) or moderate pain (pain score 4-6). (Patient not taking: Reported on 02/14/2024)    simethicone  (MYLICON) 80 MG chewable tablet Chew 1 tablet (80 mg total) by mouth 4 (four) times daily for 15 days. (Patient not taking: Reported on 02/14/2024)   No facility-administered encounter medications on file as of 02/14/2024.   Hearing/Vision screen Hearing Screening - Comments:: Denies hearing loss  Vision Screening - Comments:: Needs routine eye exam. Gave ph# to call and schedule routine eye exam with Dr. Lynwood Hasting Immunizations and Health Maintenance Health Maintenance  Topic Date Due   COVID-19 Vaccine (4 - 2025-26 season) 01/19/2025 (Originally 12/03/2023)   Colonoscopy  11/17/2024   Medicare Annual Wellness (AWV)  02/13/2025   DTaP/Tdap/Td (2 - Td or Tdap) 06/23/2027   Pneumococcal Vaccine: 50+ Years  Completed   Influenza Vaccine  Completed   Hepatitis C Screening  Completed   Zoster Vaccines- Shingrix  Completed   Meningococcal B Vaccine  Aged Out        Assessment/Plan:  This  is a routine wellness examination for [place].  Patient Care Team: Kotturi, Vinay K, MD as PCP - General (Family Medicine) Rennie Cindy SAUNDERS, MD as Consulting Physician (Oncology) Maurie Rayfield BIRCH, RN as Oncology Nurse Navigator McLaurin, Nichole LABOR, RN as VBCI Care Management Delene Thersia JINNY georgann DESIREE Care Management Alvia Selinda JINNY, MD as Consulting Physician (Sports Medicine) Florencio Cara BIRCH, MD as Consulting Physician (Cardiology) Samuella Twyla HERO, PA-C (Gastroenterology) Sharlot Agent, OD (Optometry)  I have personally reviewed and noted the following in the patient's chart:   Medical and social history Use of alcohol, tobacco or illicit drugs  Current medications and supplements including opioid prescriptions. Functional ability and status Nutritional status Physical activity Advanced directives List of other physicians Hospitalizations, surgeries, and ER visits in previous 12 months Vitals Screenings to include cognitive, depression, and falls Referrals and  appointments  No orders of the defined types were placed in this encounter.  In addition, I have reviewed and discussed with patient certain preventive protocols, quality metrics, and best practice recommendations. A written personalized care plan for preventive services as well as general preventive health recommendations were provided to patient.   Vina Ned, CMA   02/14/2024   Return in 53 weeks (on 02/19/2025).  After Visit Summary: (Mail) Due to this being a telephonic visit, the after visit summary with patients personalized plan was offered to patient via mail   Nurse Notes:  6 CIT Score - 2 Needs routine eye exam. Gave ph# to eye doctor for patient to schedule an appointment. Plans to get Covid vaccine (pharmacy)

## 2024-02-14 NOTE — Patient Instructions (Signed)
 Mr. Guarisco,  Thank you for taking the time for your Medicare Wellness Visit. I appreciate your continued commitment to your health goals. Please review the care plan we discussed, and feel free to reach out if I can assist you further.  Please note that Annual Wellness Visits do not include a physical exam. Some assessments may be limited, especially if the visit was conducted virtually. If needed, we may recommend an in-person follow-up with your provider.  Ongoing Care Seeing your primary care provider every 3 to 6 months helps us  monitor your health and provide consistent, personalized care.   Referrals If a referral was made during today's visit and you haven't received any updates within two weeks, please contact the referred provider directly to check on the status.  Recommended Screenings:  You may get a covid vaccine at your local pharmacy at your convenience.  Please call [phone] to schedule a routine eye exam with Dr. Lynwood Hasting @ [facility] in [place] at your earliest convenience.    Health Maintenance  Topic Date Due   Medicare Annual Wellness Visit  02/02/2023   COVID-19 Vaccine (4 - 2025-26 season) 01/19/2025*   Colon Cancer Screening  11/17/2024   DTaP/Tdap/Td vaccine (2 - Td or Tdap) 06/23/2027   Pneumococcal Vaccine for age over 32  Completed   Flu Shot  Completed   Hepatitis C Screening  Completed   Zoster (Shingles) Vaccine  Completed   Meningitis B Vaccine  Aged Out  *Topic was postponed. The date shown is not the original due date.       02/14/2024   10:08 AM  Advanced Directives  Does Patient Have a Medical Advance Directive? No  Would patient like information on creating a medical advance directive? Yes (MAU/Ambulatory/Procedural Areas - Information given)    Vision: Annual vision screenings are recommended for early detection of glaucoma, cataracts, and diabetic retinopathy. These exams can also reveal signs of chronic conditions such as  diabetes and high blood pressure.  Dental: Annual dental screenings help detect early signs of oral cancer, gum disease, and other conditions linked to overall health, including heart disease and diabetes.  Please see the attached documents for additional preventive care recommendations.   Fall Prevention in the Home, Adult Falls can cause injuries and affect people of all ages. There are many simple things that you can do to make your home safe and to help prevent falls. If you need it, ask for help making these changes. What actions can I take to prevent falls? General information Use good lighting in all rooms. Make sure to: Replace any light bulbs that burn out. Turn on lights if it is dark and use night-lights. Keep items that you use often in easy-to-reach places. Lower the shelves around your home if needed. Move furniture so that there are clear paths around it. Do not keep throw rugs or other things on the floor that can make you trip. If any of your floors are uneven, fix them. Add color or contrast paint or tape to clearly mark and help you see: Grab bars or handrails. First and last steps of staircases. Where the edge of each step is. If you use a ladder or stepladder: Make sure that it is fully opened. Do not climb a closed ladder. Make sure the sides of the ladder are locked in place. Have someone hold the ladder while you use it. Know where your pets are as you move through your home. What can I do in  the bathroom?     Keep the floor dry. Clean up any water  that is on the floor right away. Remove soap buildup in the bathtub or shower. Buildup makes bathtubs and showers slippery. Use non-skid mats or decals on the floor of the bathtub or shower. Attach bath mats securely with double-sided, non-slip rug tape. If you need to sit down while you are in the shower, use a non-slip stool. Install grab bars by the toilet and in the bathtub and shower. Do not use towel bars  as grab bars. What can I do in the bedroom? Make sure that you have a light by your bed that is easy to reach. Do not use any sheets or blankets on your bed that hang to the floor. Have a firm bench or chair with side arms that you can use for support when you get dressed. What can I do in the kitchen? Clean up any spills right away. If you need to reach something above you, use a sturdy step stool that has a grab bar. Keep electrical cables out of the way. Do not use floor polish or wax that makes floors slippery. What can I do with my stairs? Do not leave anything on the stairs. Make sure that you have a light switch at the top and the bottom of the stairs. Have them installed if you do not have them. Make sure that there are handrails on both sides of the stairs. Fix handrails that are broken or loose. Make sure that handrails are as long as the staircases. Install non-slip stair treads on all stairs in your home if they do not have carpet. Avoid having throw rugs at the top or bottom of stairs, or secure the rugs with carpet tape to prevent them from moving. Choose a carpet design that does not hide the edge of steps on the stairs. Make sure that carpet is firmly attached to the stairs. Fix any carpet that is loose or worn. What can I do on the outside of my home? Use bright outdoor lighting. Repair the edges of walkways and driveways and fix any cracks. Clear paths of anything that can make you trip, such as tools or rocks. Add color or contrast paint or tape to clearly mark and help you see high doorway thresholds. Trim any bushes or trees on the main path into your home. Check that handrails are securely fastened and in good repair. Both sides of all steps should have handrails. Install guardrails along the edges of any raised decks or porches. Have leaves, snow, and ice cleared regularly. Use sand, salt, or ice melt on walkways during winter months if you live where there is ice and  snow. In the garage, clean up any spills right away, including grease or oil spills. What other actions can I take? Review your medicines with your health care provider. Some medicines can make you confused or feel dizzy. This can increase your chance of falling. Wear closed-toe shoes that fit well and support your feet. Wear shoes that have rubber soles and low heels. Use a cane, walker, scooter, or crutches that help you move around if needed. Talk with your provider about other ways that you can decrease your risk of falls. This may include seeing a physical therapist to learn to do exercises to improve movement and strength. Where to find more information Centers for Disease Control and Prevention, STEAD[employer]: tonerpromos.no [employer] on Aging: baseringtones.pl National Institute on Aging: baseringtones.pl Contact a health  care provider if: You are afraid of falling at home. You feel weak, drowsy, or dizzy at home. You fall at home. Get help right away if you: Lose consciousness or have trouble moving after a fall. Have a fall that causes a head injury. These symptoms may be an emergency. Get help right away. Call 911. Do not wait to see if the symptoms will go away. Do not drive yourself to the hospital. This information is not intended to replace advice given to you by your health care provider. Make sure you discuss any questions you have with your health care provider. Document Revised: 11/21/2021 Document Reviewed: 11/21/2021 Elsevier Patient Education  2024 [employer].

## 2024-02-18 ENCOUNTER — Encounter: Payer: Self-pay | Admitting: Orthopedic Surgery

## 2024-02-18 NOTE — Progress Notes (Unsigned)
 Referring Physician:  Alvia Selinda PARAS, MD [address] [place],  [place] [address]  Primary Physician:  Kotturi, Vinay K, MD  History of Present Illness: 02/21/2024 [name] has a history of HTN, CAD, heart failure, peptic ulcer, colon CA, hyperlipidemia, obesity, CABG with history of MRSA, CKD, GERD, PE.   History of ACDF C4-C7 in 2014. Also with history of vertebral osteomyelitis in his chart- patient does not know anything about this.   He had recent right hemicolectomy on 11/21/23 for mass of colon.   6 month history of constant LBP with no leg pain. LBP > leg symptoms. Pain is worse with walking/standing and better with rest. Grocery cart helps. He has numbness, tingling, and weakness in posterior legs bilaterally.   He is on oxycodone  and baclofen  from his PCP. No improvement in leg pain with prednisone , this helped his LBP.   No NSAIDs due to CKD.   Tobacco use: Does not smoke.   Bowel/Bladder Dysfunction: none  Conservative measures:  Physical therapy:  has not participated in Multimodal medical therapy including regular antiinflammatories:  Prednisone , Oxycodone , Gabapentin , Baclofen , Tylenol  Injections:  01/09/2019- Bilateral TFESI L5-S1  Past Surgery:  12/11/2012 Cervical Fusion-C4-5,C5-6,C6-7   Sherri Levenhagen Cozad has no symptoms of cervical myelopathy.  The symptoms are causing a significant impact on the patient's life.   Review of Systems:  A 10 point review of systems is negative, except for the pertinent positives and negatives detailed in the HPI.  Past Medical History: Past Medical History:  Diagnosis Date   Arthritis    knee   Chronic kidney disease    Colon cancer (HCC)    GERD (gastroesophageal reflux disease)    Hyperlipidemia    Hypertension     Past Surgical History: Past Surgical History:  Procedure Laterality Date   CARDIAC SURGERY  03/2010   bypass   CERVICAL FUSION  12/11/2012   C4-5,C5-6,C6-7   COLONOSCOPY   2011 ?   COLONOSCOPY  11/18/2023   Procedure: COLONOSCOPY;  Surgeon: Onita Elspeth Sharper, DO;  Location: [facility] ENDOSCOPY;  Service: Gastroenterology;;   COLONOSCOPY WITH PROPOFOL  N/A 07/11/2017   Procedure: COLONOSCOPY WITH PROPOFOL ;  Surgeon: Unk Corinn Skiff, MD;  Location: [facility];  Service: Endoscopy;  Laterality: N/A;   ESOPHAGOGASTRODUODENOSCOPY N/A 11/18/2023   Procedure: EGD (ESOPHAGOGASTRODUODENOSCOPY);  Surgeon: Onita Elspeth Sharper, DO;  Location: [facility] ENDOSCOPY;  Service: Gastroenterology;  Laterality: N/A;   POLYPECTOMY N/A 07/11/2017   Procedure: POLYPECTOMY;  Surgeon: Unk Corinn Skiff, MD;  Location: [facility];  Service: Endoscopy;  Laterality: N/A;    Allergies: Allergies as of 02/21/2024 - Review Complete 02/21/2024  Allergen Reaction Noted   Sulfa antibiotics Other (See Comments) 12/16/2014   Sulfamethoxazole-trimethoprim Other (See Comments) 08/17/2011   Sulfasalazine Other (See Comments) 12/16/2014    Medications: Outpatient Encounter Medications as of 02/21/2024  Medication Sig   acetaminophen  (TYLENOL ) 325 MG tablet Take 2 tablets (650 mg total) by mouth every 4 (four) hours as needed for headache or mild pain (pain score 1-3).   amLODipine  (NORVASC ) 5 MG tablet Take 5 mg by mouth daily.   aspirin  EC 81 MG tablet Take 1 tablet (81 mg total) by mouth daily.   atorvastatin  (LIPITOR ) 80 MG tablet TAKE 1 TABLET BY MOUTH ONCE  DAILY   Baclofen  5 MG TABS Take 1 tablet (5 mg total) by mouth 3 (three) times daily as needed.   docusate sodium (COLACE) 100 MG capsule Take 100 mg by mouth 2 (  two) times daily. (Patient taking differently: Take 100 mg by mouth 2 (two) times daily. Taking 1 tablet daily)   isosorbide  mononitrate (IMDUR ) 30 MG 24 hr tablet Take 30 mg by mouth daily.   metoprolol  tartrate (LOPRESSOR ) 25 MG tablet Take 0.5 tablets (12.5 mg total) by mouth 2 (two) times daily.   nitroGLYCERIN  (NITROSTAT ) 0.4 MG SL tablet Place 1 tablet  (0.4 mg total) under the tongue every 5 (five) minutes as needed for chest pain.   ondansetron  (ZOFRAN -ODT) 4 MG disintegrating tablet Take 1 tablet (4 mg total) by mouth every 8 (eight) hours as needed for nausea or vomiting.   pantoprazole  (PROTONIX ) 40 MG tablet TAKE 1 TABLET BY MOUTH DAILY   simethicone  (MYLICON) 80 MG chewable tablet Chew 1 tablet (80 mg total) by mouth 4 (four) times daily for 15 days.   [DISCONTINUED] ferrous sulfate  325 (65 FE) MG tablet Take 1 tablet (325 mg total) by mouth every other day. (Patient not taking: Reported on 02/14/2024)   [DISCONTINUED] gabapentin  (NEURONTIN ) 100 MG capsule Take 1 capsule (100 mg total) by mouth 3 (three) times daily as needed. (Patient not taking: Reported on 02/14/2024)   [DISCONTINUED] oxyCODONE  (ROXICODONE ) 5 MG immediate release tablet Take 1 tablet (5 mg total) by mouth every 6 (six) hours as needed for severe pain (pain score 7-10) or moderate pain (pain score 4-6). (Patient not taking: Reported on 02/14/2024)   No facility-administered encounter medications on file as of 02/21/2024.    Social History: Social History   Tobacco Use   Smoking status: Former    Current packs/day: 0.00    Average packs/day: 1.5 packs/day for 30.0 years (45.0 ttl pk-yrs)    Types: Cigarettes    Start date: 63    Quit date: 1997    Years since quitting: 28.9   Smokeless tobacco: Former    Types: Chew    Quit date: 1985   Tobacco comments:    smoking cessation materials not required  Vaping Use   Vaping status: Never Used  Substance Use Topics   Alcohol use: No    Alcohol/week: 0.0 standard drinks of alcohol   Drug use: No    Family Medical History: Family History  Problem Relation Age of Onset   Hypertension Father    Hypotension Mother     Physical Examination: Vitals:   02/21/24 0908  BP: 122/72    General: Patient is well developed, well nourished, calm, collected, and in no apparent distress. Attention to examination is  appropriate.  Respiratory: Patient is breathing without any difficulty.   NEUROLOGICAL:     Awake, alert, oriented to person, place, and time.  Speech is clear and fluent. Fund of knowledge is appropriate.   Cranial Nerves: Pupils equal round and reactive to light.  Facial tone is symmetric.    No posterior lumbar tenderness.   No abnormal lesions on exposed skin.   Strength: Side Biceps Triceps Deltoid Interossei Grip Wrist Ext. Wrist Flex.  R 5 5 5 5 5 5 5   L 5 5 5 5 5 5 5    Side Iliopsoas Quads Hamstring PF DF EHL  R 5 5 5 5 5 5   L 5 5 5 5 5 5    Reflexes are 1+ and symmetric at the biceps, brachioradialis, patella and achilles.   Hoffman's is absent.  Clonus is not present.   Bilateral upper and lower extremity sensation is intact to light touch.     No pain with IR/ER of both hips.  Gait is normal.     Medical Decision Making  Imaging: Lumbar MRI dated 01/23/24:  FINDINGS:   BONES AND ALIGNMENT: Transitional lumbosacral anatomy. L5 is sacralized. This same number and terminology was utilized on the comparison study of 10/01/2018. Chronic disc space narrowing with fusion of the T10 and T11 vertebral bodies, unchanged as visualized when compared to the study of 10/01/2018. Degenerative endplate marrow changes at L4-5 with mild edematous change. This could relate to low back pain. Benign-appearing hemangioma within the right side of the sacralized L5 vertebral body, slightly larger than was seen previously but not worrisome. Normal vertebral body heights.   SPINAL CORD: The conus terminates normally.   SOFT TISSUES: No paraspinal mass.   T11-T12: Normal appearance of the intervertebral disc spaces.   T12-L1: Normal appearance of the intervertebral disc spaces.   L1-L2: Right lateral extraforaminal osteophyte and protruding disc material with some potential to affect the right L1 nerve in the extraforaminal location. No apparent change since  10/01/2018.   L2-L3: Endplate osteophytes and bulging of the disc. Facet degeneration and hypertrophy, worse on the right than the left. Stenosis of the lateral recesses, right worse than left. Neural compression is possible, particularly in the right lateral recess.   L3-L4: Endplate osteophytes and circumferential bulging of the disc. Bilateral facet and ligamentous hypertrophy. Severe multifactorial spinal stenosis, worsened since 10/01/2018. Bilateral neuroforaminal narrowing which could possibly affect the exiting L3 nerves. Some edematous change of the facet joints could relate to back pain or referred facet syndrome pain.   L4-L5: Endplate osteophytes and bulging of the disc. Bilateral facet degeneration and hypertrophy. Stenosis of both subarticular lateral recesses that could cause compression of the L5 nerves. Foraminal narrowing left worse than right with some potential to affect, in particular, the left L4 nerve. These findings have worsened since 10/01/2018.   L5-S1: Rudimentary and unremarkable level due to transitional anatomy.   IMPRESSION: 1. Transitional anatomy. L5 is sacralized. The same terminology has been used on the study of 10/01/2018. 2. L2-3: Lateral recess stenosis right worse than left with potential for neural compression particularly on the right. 3. Severe multifactorial spinal stenosis at L3-4, worsened since 2020. 4. Worsened subarticular and lateral recess stenosis at L4-5 since 2020. left-greater-than-right foraminal narrowing that may additionally affect the left L4 nerve.   Electronically signed by: Oneil Officer MD 01/26/2024 10:58 AM EDT RP Workstation: [id number]      I have personally reviewed the images and agree with the above interpretation.  Assessment and Plan: Mr. Bamba has a history of ACDF C4-C7 in 2014. Also with history of vertebral osteomyelitis in his chart- patient does not know anything about this.   He had recent  right hemicolectomy on 11/21/23 for mass of colon.   6 month history of constant LBP with no leg pain. LBP > leg symptoms. Pain is worse with walking/standing and better with rest. He has numbness, tingling, and weakness in posterior legs bilaterally.   He has known lumbar spondylosis and DDD he has mild/moderate central stenosis L2-L3, severe central stenosis L3-L4, and multilevel foraminal/lateral recess stenosis.   Treatment options discussed with patient and following plan made:   - Order for physical therapy for lumbar spine to [facility] in [place]. Patient to call to schedule appointment.  - Referral to PMR at [facility] to discuss possible lumbar injections. Will have him see Dr. Dodson as he would like to have injections in [place].  - If no improvement with above, he may be a  candidate for surgery.  - Follow up with me in 8 weeks and prn.   I spent a total of 45 minutes in face-to-face and non-face-to-face activities related to this patient's care today including review of outside records, review of imaging, review of symptoms, physical exam, discussion of differential diagnosis, discussion of treatment options, and documentation.   Thank you for involving me in the care of this patient.   Glade Boys PA-C Dept. of Neurosurgery

## 2024-02-19 LAB — SIGNATERA
SIGNATERA MTM READOUT: 0 MTM/ml
SIGNATERA TEST RESULT: NEGATIVE

## 2024-02-21 ENCOUNTER — Ambulatory Visit (INDEPENDENT_AMBULATORY_CARE_PROVIDER_SITE_OTHER): Admitting: Orthopedic Surgery

## 2024-02-21 ENCOUNTER — Encounter: Payer: Self-pay | Admitting: Orthopedic Surgery

## 2024-02-21 VITALS — BP 122/72 | Ht 66.0 in | Wt 182.2 lb

## 2024-02-21 DIAGNOSIS — M4726 Other spondylosis with radiculopathy, lumbar region: Secondary | ICD-10-CM

## 2024-02-21 DIAGNOSIS — M5136 Other intervertebral disc degeneration, lumbar region with discogenic back pain only: Secondary | ICD-10-CM

## 2024-02-21 DIAGNOSIS — M47816 Spondylosis without myelopathy or radiculopathy, lumbar region: Secondary | ICD-10-CM

## 2024-02-21 DIAGNOSIS — M5416 Radiculopathy, lumbar region: Secondary | ICD-10-CM

## 2024-02-21 DIAGNOSIS — M48062 Spinal stenosis, lumbar region with neurogenic claudication: Secondary | ICD-10-CM | POA: Diagnosis not present

## 2024-02-21 NOTE — Patient Instructions (Signed)
 It was so nice to see you today. Thank you so much for coming in.    You have some wear and tear in your back (arthritis) along with spinal stenosis.   I sent physical therapy orders to [facility] in [place]. You can call them at [phone] if you don't hear from them to schedule your visit.   I want you to see physical medicine and rehab at the [facility] to discuss possible injections in your lower back. Dr. Avanell, Dr. Dodson, and their NP [place] are great and will take good care of you. They should call you to schedule an appointment or you can call them at [phone]. Dr. Dodson does injections in [place] so you may be able to get them done closer to home.   I will see you back in 6=8 weeks. Please do not hesitate to call if you have any questions or concerns. You can also message me in MyChart.   Glade Boys PA-C [phone]     The physicians and staff at [facility] at [facility] are committed to providing excellent care. You may receive a survey asking for feedback about your experience at our office. We value you your feedback and appreciate you taking the time to to fill it out. The [facility] leadership team is also available to discuss your experience in person, feel free to contact us  [phone].

## 2024-03-03 ENCOUNTER — Telehealth: Payer: Self-pay

## 2024-03-03 NOTE — Patient Instructions (Signed)
 Marsha Gals Tabbert - I am sorry I was unable to reach you today for our scheduled appointment. I work with Kotturi, Vinay K, MD and am calling to support your healthcare needs. Please contact me at [phone] at your earliest convenience. I look forward to speaking with you soon.   Thank you,  Thersia Hoar, BSW, MHA [place]  [facility] Social Worker, Population Health [phone]

## 2024-03-05 ENCOUNTER — Other Ambulatory Visit: Admitting: *Deleted

## 2024-03-05 ENCOUNTER — Other Ambulatory Visit: Payer: Self-pay

## 2024-03-05 DIAGNOSIS — I25810 Atherosclerosis of coronary artery bypass graft(s) without angina pectoris: Secondary | ICD-10-CM

## 2024-03-05 DIAGNOSIS — E785 Hyperlipidemia, unspecified: Secondary | ICD-10-CM

## 2024-03-05 MED ORDER — ATORVASTATIN CALCIUM 80 MG PO TABS: 80.0000 mg | ORAL_TABLET | Freq: Every day | ORAL | 3 refills | Status: AC

## 2024-03-05 NOTE — Patient Instructions (Signed)
 Visit Information  Thank you for taking time to visit with me today. Please don't hesitate to contact me if I can be of assistance to you before our next scheduled appointment.  Your next care management appointment is by telephone on 04/21/24 at 10 am.  Please call the care guide team at [phone] if you need to cancel, schedule, or reschedule an appointment.   Please call the Suicide and Crisis Lifeline: 988 call the [place]  National Suicide Prevention Lifeline: 226-220-5053 or TTY: (519)447-1685 TTY 704-241-4459) to talk to a trained counselor call 1-800-273-TALK (toll free, 24 hour hotline) go to [facility] [address] [place] [phone]) call 911 if you are experiencing a Mental Health or Behavioral Health Crisis or need someone to talk to.  Debany Vantol, RN, BSN, Theatre Manager [employer] [phone]

## 2024-03-05 NOTE — Patient Outreach (Signed)
 Complex Care Management   Visit Note  03/05/2024  Name:  Matthew Humphrey MRN: [id number] DOB: [birth year]  Situation: Referral received for Complex Care Management related to SDOH Barriers:  Food insecurity and CHF and HTN I obtained verbal consent from Patient.  Visit completed with Patient  on the phone  Background:   Past Medical History:  Diagnosis Date   Arthritis    knee   Chronic kidney disease    Colon cancer (HCC)    GERD (gastroesophageal reflux disease)    Hyperlipidemia    Hypertension     Assessment: Patient Reported Symptoms:  Cognitive Cognitive Status: Able to follow simple commands, Alert and oriented to person, place, and time, Insightful and able to interpret abstract concepts, Normal speech and language skills Cognitive/Intellectual Conditions Management [RPT]: None reported or documented in medical history or problem list   Health Maintenance Behaviors: Annual physical exam, Healthy diet, Sleep adequate, Stress management Healing Pattern: Average Health Facilitated by: Healthy diet, Pain control, Rest, Stress management  Neurological Neurological Review of Symptoms: No symptoms reported Neurological Management Strategies: Adequate rest, Routine screening Neurological Self-Management Outcome: 4 (good)  HEENT HEENT Symptoms Reported: No symptoms reported HEENT Management Strategies: Adequate rest, Routine screening HEENT Self-Management Outcome: 4 (good)    Cardiovascular Cardiovascular Symptoms Reported: Other: Other Cardiovascular Symptoms: Patient reports that his blood pressure on yesterday was 172/80.  Discussed continuing taking blood pressure once a week and recording readings. Also discussed eating foods low in sodium, and decreasing fried foods. Does patient have uncontrolled Hypertension?: No Cardiovascular Management Strategies: Medication therapy, Coping strategies, Routine screening, Adequate rest Cardiovascular Self-Management Outcome: 3  (uncertain)  Respiratory Respiratory Symptoms Reported: No symptoms reported Other Respiratory Symptoms: Denies shortness of breath Respiratory Management Strategies: Adequate rest, Coping strategies, Routine screening Respiratory Self-Management Outcome: 4 (good)  Endocrine Endocrine Symptoms Reported: No symptoms reported Is patient diabetic?: No Endocrine Self-Management Outcome: 4 (good)  Gastrointestinal Gastrointestinal Symptoms Reported: No symptoms reported Gastrointestinal Management Strategies: Adequate rest Gastrointestinal Self-Management Outcome: 4 (good)    Genitourinary Genitourinary Symptoms Reported: No symptoms reported Genitourinary Management Strategies: Adequate rest Genitourinary Self-Management Outcome: 4 (good)  Integumentary Integumentary Symptoms Reported: No symptoms reported Skin Management Strategies: Adequate rest, Coping strategies Skin Self-Management Outcome: 4 (good)  Musculoskeletal Musculoskelatal Symptoms Reviewed: Joint pain Additional Musculoskeletal Details: Reports that he has arthritis in his knees and legs bilaterally.  Peports that he takes Tylenol  as needed for pain. Patient reports pain level is 5/10 on pain scale. Musculoskeletal Management Strategies: Adequate rest, Coping strategies, Routine screening Musculoskeletal Self-Management Outcome: 4 (good) Falls in the past year?: No Number of falls in past year: 1 or less Was there an injury with Fall?: No Fall Risk Category Calculator: 0 Patient Fall Risk Level: Low Fall Risk Patient at Risk for Falls Due to: Impaired balance/gait, Impaired mobility Fall risk Follow up: Falls evaluation completed, Education provided, Falls prevention discussed  Psychosocial Psychosocial Symptoms Reported: No symptoms reported Behavioral Management Strategies: Support system Behavioral Health Self-Management Outcome: 4 (good) Major Change/Loss/Stressor/Fears (CP): Denies Quality of Family Relationships:  helpful, involved, supportive Do you feel physically threatened by others?: No    03/05/2024    PHQ2-9 Depression Screening   Little interest or pleasure in doing things Not at all  Feeling down, depressed, or hopeless Not at all  PHQ-2 - Total Score 0  Trouble falling or staying asleep, or sleeping too much    Feeling tired or having little energy    Poor appetite or overeating  Feeling bad about yourself - or that you are a failure or have let yourself or your family down    Trouble concentrating on things, such as reading the newspaper or watching television    Moving or speaking so slowly that other people could have noticed.  Or the opposite - being so fidgety or restless that you have been moving around a lot more than usual    Thoughts that you would be better off dead, or hurting yourself in some way    PHQ2-9 Total Score    If you checked off any problems, how difficult have these problems made it for you to do your work, take care of things at home, or get along with other people    Depression Interventions/Treatment      Today's Vitals   03/04/24 1706  BP: (!) 172/80   Pain Scale: 0-10 Pain Score: 5  Pain Type: Chronic pain Pain Location: Knee Pain Orientation: Right, Left Pain Descriptors / Indicators: Aching, Discomfort Patients Stated Pain Goal: 5 Pain Intervention(s): Medication (See eMAR), Relaxation, Rest  Medications Reviewed Today     Reviewed by Jorja Nichole LABOR, RN (Case Manager) on 03/05/24 at 1119  Med List Status: <None>   Medication Order Taking? Sig Documenting Provider Last Dose Status Informant  acetaminophen  (TYLENOL ) 325 MG tablet [id number] Yes Take 2 tablets (650 mg total) by mouth every 4 (four) hours as needed for headache or mild pain (pain score 1-3). Fausto Sor A, DO  Active   amLODipine  (NORVASC ) 5 MG tablet [id number] Yes Take 5 mg by mouth daily. [provider]  Active Self  aspirin  EC 81 MG tablet [id number] Yes Take 1  tablet (81 mg total) by mouth daily. Joshua Cathryne BROCKS, MD  Active Self  atorvastatin  (LIPITOR ) 80 MG tablet [id number] Yes TAKE 1 TABLET BY MOUTH ONCE  DAILY Jones, Deanna C, MD  Active Self  Baclofen  5 MG TABS [id number] Yes Take 1 tablet (5 mg total) by mouth 3 (three) times daily as needed. Alvia Selinda PARAS, MD  Active Self  docusate sodium (COLACE) 100 MG capsule [id number] Yes Take 100 mg by mouth 2 (two) times daily.  Patient taking differently: Take 100 mg by mouth 2 (two) times daily. Taking 1 tablet daily   [provider]  Active   isosorbide  mononitrate (IMDUR ) 30 MG 24 hr tablet [id number] Yes Take 30 mg by mouth daily. [provider]  Active Self  metoprolol  tartrate (LOPRESSOR ) 25 MG tablet [id number] Yes Take 0.5 tablets (12.5 mg total) by mouth 2 (two) times daily. Joshua Cathryne BROCKS, MD  Active Self  nitroGLYCERIN  (NITROSTAT ) 0.4 MG SL tablet [id number] Yes Place 1 tablet (0.4 mg total) under the tongue every 5 (five) minutes as needed for chest pain. Joshua Cathryne BROCKS, MD  Active Self  ondansetron  (ZOFRAN -ODT) 4 MG disintegrating tablet [id number] Yes Take 1 tablet (4 mg total) by mouth every 8 (eight) hours as needed for nausea or vomiting. Fausto Sor A, DO  Active   pantoprazole  (PROTONIX ) 40 MG tablet [id number] Yes TAKE 1 TABLET BY MOUTH DAILY Jones, Deanna C, MD  Active Self  simethicone  (MYLICON) 80 MG chewable tablet [id number] Yes Chew 1 tablet (80 mg total) by mouth 4 (four) times daily for 15 days. Fausto Sor LABOR, DO  Active             Recommendation:   PCP Follow-up Specialty provider follow-up :Neurosurgery-04/17/24; Cardiology-07/01/24; Radiology-07/07/24; Oncology-07/21/24 Continue Current Plan of Care  Follow Up Plan:  Telephone follow-up in 1 month: 04/21/24 @ 10 am  Draiden Mirsky, RN, BSN, Theatre Manager [employer] [phone]

## 2024-03-14 ENCOUNTER — Telehealth: Payer: Self-pay

## 2024-03-14 NOTE — Patient Instructions (Signed)
 Matthew Humphrey - I am sorry I was unable to reach you today for our scheduled appointment. I work with Kotturi, Vinay K, MD and am calling to support your healthcare needs. Please contact me at [phone] at your earliest convenience. I look forward to speaking with you soon.   Thank you,  Thersia Hoar, BSW, MHA [place]  [facility] Social Worker, Population Health [phone]

## 2024-03-20 ENCOUNTER — Telehealth

## 2024-03-20 NOTE — Patient Instructions (Signed)
 Marsha Gals Tabbert - I am sorry I was unable to reach you today for our scheduled appointment. I work with Kotturi, Vinay K, MD and am calling to support your healthcare needs. Please contact me at [phone] at your earliest convenience. I look forward to speaking with you soon.   Thank you,  Thersia Hoar, BSW, MHA [place]  [facility] Social Worker, Population Health [phone]

## 2024-03-25 ENCOUNTER — Other Ambulatory Visit: Payer: Self-pay | Admitting: *Deleted

## 2024-03-25 DIAGNOSIS — C182 Malignant neoplasm of ascending colon: Secondary | ICD-10-CM

## 2024-04-02 ENCOUNTER — Inpatient Hospital Stay: Attending: Internal Medicine

## 2024-04-02 DIAGNOSIS — C182 Malignant neoplasm of ascending colon: Secondary | ICD-10-CM

## 2024-04-02 LAB — GENETIC SCREENING ORDER

## 2024-04-10 ENCOUNTER — Encounter: Payer: Self-pay | Admitting: Internal Medicine

## 2024-04-10 LAB — SIGNATERA
SIGNATERA MTM READOUT: 0 MTM/ml
SIGNATERA TEST RESULT: NEGATIVE

## 2024-04-11 ENCOUNTER — Ambulatory Visit: Admitting: Family Medicine

## 2024-04-11 ENCOUNTER — Telehealth: Payer: Self-pay

## 2024-04-11 ENCOUNTER — Ambulatory Visit: Payer: Self-pay | Admitting: Internal Medicine

## 2024-04-11 NOTE — Telephone Encounter (Signed)
 Patient is scheduled to see Glade for follow up next week after completing PT and getting injection with pain management. I do not see either has been done in the chart? I called and left a message with Amy-ok per DPR on file, to have patient call back to confirm this information and reschedule until both things have been done unless patient insists on still coming to see Glade

## 2024-04-11 NOTE — Progress Notes (Signed)
 Pt.notified

## 2024-04-14 ENCOUNTER — Encounter: Payer: Self-pay | Admitting: Family Medicine

## 2024-04-14 ENCOUNTER — Ambulatory Visit (INDEPENDENT_AMBULATORY_CARE_PROVIDER_SITE_OTHER): Payer: Medicare (Managed Care) | Admitting: Family Medicine

## 2024-04-14 VITALS — BP 142/62 | HR 63 | Ht 66.0 in | Wt 183.0 lb

## 2024-04-14 DIAGNOSIS — I10 Essential (primary) hypertension: Secondary | ICD-10-CM

## 2024-04-14 DIAGNOSIS — C182 Malignant neoplasm of ascending colon: Secondary | ICD-10-CM | POA: Diagnosis not present

## 2024-04-14 DIAGNOSIS — N1831 Chronic kidney disease, stage 3a: Secondary | ICD-10-CM | POA: Insufficient documentation

## 2024-04-14 DIAGNOSIS — I509 Heart failure, unspecified: Secondary | ICD-10-CM | POA: Diagnosis not present

## 2024-04-14 DIAGNOSIS — K649 Unspecified hemorrhoids: Secondary | ICD-10-CM

## 2024-04-14 DIAGNOSIS — J438 Other emphysema: Secondary | ICD-10-CM | POA: Diagnosis not present

## 2024-04-14 MED ORDER — AMLODIPINE BESYLATE 10 MG PO TABS: 10.0000 mg | ORAL_TABLET | Freq: Every day | ORAL | 1 refills | Status: AC

## 2024-04-14 MED ORDER — HYDROCORTISONE ACETATE 25 MG RE SUPP: 25.0000 mg | Freq: Two times a day (BID) | RECTAL | 0 refills | Status: AC

## 2024-04-14 NOTE — Telephone Encounter (Signed)
 Matthew Humphrey called again today no answer

## 2024-04-14 NOTE — Progress Notes (Signed)
" ° °  Established Patient Office Visit  Patient ID: Matthew Humphrey, male    DOB: [birth year]  Age: 79 y.o. MRN: [id number] PCP: Haydan Wedig K, MD  Chief Complaint  Patient presents with   Stage 3a chronic kidney disease     Subjective:     HPI  Discussed the use of AI scribe software for clinical note transcription with the patient, who gave verbal consent to proceed.  History of Present Illness Matthew Humphrey is a 79 year old male who presents with rectal bleeding and hemorrhoids.  He has been experiencing intermittent rectal bleeding, which he attributes to hemorrhoids. He denies constipation and states that the hemorrhoids are not protruding.  He is currently on medication for high blood pressure, which he took this morning. Despite adherence to his medication regimen, he notes that his blood pressure has been elevated recently.  No constipation. He confirms seeing blood from his intestine, which he attributes to hemorrhoids.     Review of Systems  All other systems reviewed and are negative.     Objective:     BP (!) 142/62   Pulse 63   Ht 5' 6 (1.676 m)   Wt 183 lb (83 kg)   SpO2 94%   BMI 29.54 kg/m  BP Readings from Last 3 Encounters:  04/14/24 (!) 142/62  03/04/24 (!) 172/80  02/21/24 122/72      Physical Exam Vitals and nursing note reviewed.  Constitutional:      Appearance: Normal appearance.  HENT:     Head: Normocephalic.     Right Ear: External ear normal.     Left Ear: External ear normal.  Eyes:     Conjunctiva/sclera: Conjunctivae normal.  Cardiovascular:     Rate and Rhythm: Normal rate.  Pulmonary:     Effort: Pulmonary effort is normal. No respiratory distress.  Abdominal:     Palpations: Abdomen is soft.  Musculoskeletal:        General: Normal range of motion.  Skin:    General: Skin is warm.  Neurological:     Mental Status: He is alert and oriented to person, place, and time.  Psychiatric:        Mood and Affect:  Mood normal.     Physical Exam     No results found for any visits on 04/14/24.     The 10-year ASCVD risk score (Arnett DK, et al., 2019) is: 25.4%    Assessment & Plan:   Problem List Items Addressed This Visit   None   Assessment and Plan Assessment & Plan Hemorrhoids Intermittent bleeding.  Rx sent for Anusol , continue stool softeners prn  Essential hypertension Blood pressure elevated despite medication. - Increased dose of Amlodipine  to 10 mg every day   General Health Maintenance Colonoscopy due this year. - Schedule colonoscopy in August.    No follow-ups on file.    Glendene Wyer K Evann Koelzer, MD [facility] at [facility]   "

## 2024-04-16 NOTE — Progress Notes (Unsigned)
 "  Referring Physician:  Kotturi, Vinay K, MD [address], [address] [place],  [place] [address]  Primary Physician:  Kotturi, Vinay K, MD  History of Present Illness: [name] has a history of HTN, CAD, heart failure, peptic ulcer, colon CA, hyperlipidemia, obesity, CABG with history of MRSA, CKD, GERD, PE.   History of ACDF C4-C7 in 2014. Also with history of vertebral osteomyelitis in his chart- patient does not know anything about this.   He had recent right hemicolectomy on 11/21/23 for mass of colon.   Last seen by me on 02/21/24 for LBP with no leg pain. He has known  lumbar spondylosis and DDD he has mild/moderate central stenosis L2-L3, severe central stenosis L3-L4, and multilevel foraminal/lateral recess stenosis.   He was sent to PT (did not go). He saw Dr. Dodson on 03/21/24 and she discussed scheduling a bilateral L5-S1 TF ESI. This has not been done.***  He is here for follow up.       6 month history of constant LBP with no leg pain. LBP > leg symptoms. Pain is worse with walking/standing and better with rest. Grocery cart helps. He has numbness, tingling, and weakness in posterior legs bilaterally.   He is on oxycodone  and baclofen  from his PCP. No improvement in leg pain with prednisone , this helped his LBP.   No NSAIDs due to CKD.   Tobacco use: Does not smoke.   Bowel/Bladder Dysfunction: none  Conservative measures:  Physical therapy:  has not participated in Multimodal medical therapy including regular antiinflammatories:  Prednisone , Oxycodone , Gabapentin , Baclofen , Tylenol  Injections:  01/09/2019- Bilateral TFESI L5-S1  Past Surgery:  12/11/2012 Cervical Fusion-C4-5,C5-6,C6-7   Matthew Humphrey has no symptoms of cervical myelopathy.  The symptoms are causing a significant impact on the patient's life.   Review of Systems:  A 10 point review of systems is negative, except for the pertinent positives and negatives detailed in the  HPI.  Past Medical History: Past Medical History:  Diagnosis Date   Arthritis    knee   Chronic kidney disease    Colon cancer (HCC)    GERD (gastroesophageal reflux disease)    Hyperlipidemia    Hypertension     Past Surgical History: Past Surgical History:  Procedure Laterality Date   CARDIAC SURGERY  03/2010   bypass   CERVICAL FUSION  12/11/2012   C4-5,C5-6,C6-7   COLONOSCOPY  2011 ?   COLONOSCOPY  11/18/2023   Procedure: COLONOSCOPY;  Surgeon: Matthew Elspeth Sharper, DO;  Location: [facility];  Service: Gastroenterology;;   COLONOSCOPY WITH PROPOFOL  N/A 07/11/2017   Procedure: COLONOSCOPY WITH PROPOFOL ;  Surgeon: Matthew Corinn Skiff, MD;  Location[facility];  Service: Endoscopy;  Laterality: N/A;   ESOPHAGOGASTRODUODENOSCOPY N/A 11/18/2023   Procedure: EGD (ESOPHAGOGASTRODUODENOSCOPY);  Surgeon: Matthew Elspeth Sharper, DO;  Location: [facility] ENDOSCOPY;  Service: Gastroenterology;  Laterality: N/A;   POLYPECTOMY N/A 07/11/2017   Procedure: POLYPECTOMY;  Surgeon: Matthew Corinn Skiff, MD;  Location: [facility];  Service: Endoscopy;  Laterality: N/A;    Allergies: Allergies as of 04/17/2024 - Review Complete 04/14/2024  Allergen Reaction Noted   Sulfa antibiotics Other (See Comments) 12/16/2014   Sulfamethoxazole-trimethoprim Other (See Comments) 08/17/2011   Sulfasalazine Other (See Comments) 12/16/2014    Medications: Outpatient Encounter Medications as of 04/17/2024  Medication Sig   acetaminophen  (TYLENOL ) 325 MG tablet Take 2 tablets (650 mg total) by mouth every 4 (four) hours as needed for headache or mild pain (pain score 1-3).  amLODipine  (NORVASC ) 10 MG tablet Take 1 tablet (10 mg total) by mouth daily.   aspirin  EC 81 MG tablet Take 1 tablet (81 mg total) by mouth daily.   atorvastatin  (LIPITOR ) 80 MG tablet Take 1 tablet (80 mg total) by mouth daily.   Baclofen  5 MG TABS Take 1 tablet (5 mg total) by mouth 3 (three) times daily as needed.    docusate sodium (COLACE) 100 MG capsule Take 100 mg by mouth 2 (two) times daily. (Patient taking differently: Take 100 mg by mouth 2 (two) times daily. Taking 1 tablet daily)   hydrocortisone  (ANUSOL -HC) 25 MG suppository Place 1 suppository (25 mg total) rectally 2 (two) times daily.   isosorbide  mononitrate (IMDUR ) 30 MG 24 hr tablet Take 30 mg by mouth daily.   metoprolol  tartrate (LOPRESSOR ) 25 MG tablet Take 0.5 tablets (12.5 mg total) by mouth 2 (two) times daily.   nitroGLYCERIN  (NITROSTAT ) 0.4 MG SL tablet Place 1 tablet (0.4 mg total) under the tongue every 5 (five) minutes as needed for chest pain.   ondansetron  (ZOFRAN -ODT) 4 MG disintegrating tablet Take 1 tablet (4 mg total) by mouth every 8 (eight) hours as needed for nausea or vomiting.   pantoprazole  (PROTONIX ) 40 MG tablet TAKE 1 TABLET BY MOUTH DAILY   simethicone  (MYLICON) 80 MG chewable tablet Chew 1 tablet (80 mg total) by mouth 4 (four) times daily for 15 days.   No facility-administered encounter medications on file as of 04/17/2024.    Social History: Social History   Tobacco Use   Smoking status: Former    Current packs/day: 0.00    Average packs/day: 1.5 packs/day for 30.0 years (45.0 ttl pk-yrs)    Types: Cigarettes    Start date: 61    Quit date: 1997    Years since quitting: 29.0   Smokeless tobacco: Former    Types: Chew    Quit date: 1985   Tobacco comments:    smoking cessation materials not required  Vaping Use   Vaping status: Never Used  Substance Use Topics   Alcohol use: No    Alcohol/week: 0.0 standard drinks of alcohol   Drug use: No    Family Medical History: Family History  Problem Relation Age of Onset   Hypertension Father    Hypotension Mother     Physical Examination: There were no vitals filed for this visit.    Awake, alert, oriented to person, place, and time.  Speech is clear and fluent. Fund of knowledge is appropriate.   Cranial Nerves: Pupils equal round and  reactive to light.  Facial tone is symmetric.    No posterior lumbar tenderness.   No abnormal lesions on exposed skin.   Strength: Side Biceps Triceps Deltoid Interossei Grip Wrist Ext. Wrist Flex.  R 5 5 5 5 5 5 5   L 5 5 5 5 5 5 5    Side Iliopsoas Quads Hamstring PF DF EHL  R 5 5 5 5 5 5   L 5 5 5 5 5 5    Reflexes are 1+ and symmetric at the biceps, brachioradialis, patella and achilles.   Hoffman's is absent.  Clonus is not present.   Bilateral upper and lower extremity sensation is intact to light touch.     No pain with IR/ER of both hips.   Gait is normal.     Medical Decision Making  Imaging: none  Assessment and Plan: [name] has a history of ACDF C4-C7 in 2014. Also with history of  vertebral osteomyelitis in his chart- patient does not know anything about this.   He had recent right hemicolectomy on 11/21/23 for mass of colon.   6 month history of constant LBP with no leg pain. LBP > leg symptoms. Pain is worse with walking/standing and better with rest. He has numbness, tingling, and weakness in posterior legs bilaterally.   He has known lumbar spondylosis and DDD he has mild/moderate central stenosis L2-L3, severe central stenosis L3-L4, and multilevel foraminal/lateral recess stenosis.   Treatment options discussed with patient and following plan made:   - Order for physical therapy for lumbar spine to [facility] in [place]. Patient to call to schedule appointment.  - Referral to PMR at [facility] to discuss possible lumbar injections. Will have him see Dr. Dodson as he would like to have injections in [place].  - If no improvement with above, he may be a candidate for surgery.  - Follow up with me in 8 weeks and prn.   I spent a total of 45 minutes in face-to-face and non-face-to-face activities related to this patient's care today including review of outside records, review of imaging, review of symptoms, physical exam, discussion of differential diagnosis, discussion of  treatment options, and documentation.   Thank you for involving me in the care of this patient.   Glade Boys PA-C Dept. of Neurosurgery  "

## 2024-04-17 ENCOUNTER — Ambulatory Visit: Payer: Medicare (Managed Care) | Admitting: Orthopedic Surgery

## 2024-04-21 ENCOUNTER — Telehealth: Admitting: *Deleted

## 2024-06-03 ENCOUNTER — Telehealth: Admitting: *Deleted

## 2024-07-07 ENCOUNTER — Other Ambulatory Visit

## 2024-07-17 ENCOUNTER — Ambulatory Visit: Payer: Medicare (Managed Care) | Admitting: Family Medicine

## 2024-07-21 ENCOUNTER — Ambulatory Visit: Admitting: Internal Medicine

## 2024-07-21 ENCOUNTER — Other Ambulatory Visit

## 2025-02-19 ENCOUNTER — Ambulatory Visit
